# Patient Record
Sex: Female | Born: 1995 | Race: Black or African American | Hispanic: No | Marital: Single | Smoking: Former smoker
Health system: Southern US, Community
[De-identification: ages and names within clinical notes are randomized; demographics above are authoritative.]

## PROBLEM LIST (undated history)

## (undated) ENCOUNTER — Inpatient Hospital Stay (HOSPITAL_COMMUNITY): Payer: Self-pay

## (undated) DIAGNOSIS — N390 Urinary tract infection, site not specified: Secondary | ICD-10-CM

## (undated) DIAGNOSIS — A749 Chlamydial infection, unspecified: Secondary | ICD-10-CM

## (undated) DIAGNOSIS — A549 Gonococcal infection, unspecified: Secondary | ICD-10-CM

## (undated) HISTORY — PX: EYE SURGERY: SHX253

## (undated) HISTORY — PX: HERNIA REPAIR: SHX51

## (undated) HISTORY — DX: Gonococcal infection, unspecified: A54.9

---
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
## 2013-09-10 ENCOUNTER — Emergency Department (HOSPITAL_COMMUNITY)
Admission: EM | Admit: 2013-09-10 | Discharge: 2013-09-10 | Disposition: A | Discharge status: Home / Self Care | Payer: Self-pay | Attending: Emergency Medicine | Admitting: Emergency Medicine

## 2013-09-10 ENCOUNTER — Encounter (HOSPITAL_COMMUNITY): Payer: Self-pay | Admitting: Emergency Medicine

## 2013-09-10 DIAGNOSIS — Z8619 Personal history of other infectious and parasitic diseases: Secondary | ICD-10-CM | POA: Insufficient documentation

## 2013-09-10 DIAGNOSIS — Z3202 Encounter for pregnancy test, result negative: Secondary | ICD-10-CM | POA: Insufficient documentation

## 2013-09-10 DIAGNOSIS — R102 Pelvic and perineal pain: Secondary | ICD-10-CM

## 2013-09-10 DIAGNOSIS — Z87891 Personal history of nicotine dependence: Secondary | ICD-10-CM | POA: Insufficient documentation

## 2013-09-10 DIAGNOSIS — N946 Dysmenorrhea, unspecified: Secondary | ICD-10-CM | POA: Insufficient documentation

## 2013-09-10 DIAGNOSIS — Z87440 Personal history of urinary (tract) infections: Secondary | ICD-10-CM | POA: Insufficient documentation

## 2013-09-10 DIAGNOSIS — R109 Unspecified abdominal pain: Secondary | ICD-10-CM | POA: Insufficient documentation

## 2013-09-10 DIAGNOSIS — N949 Unspecified condition associated with female genital organs and menstrual cycle: Secondary | ICD-10-CM | POA: Insufficient documentation

## 2013-09-10 LAB — URINALYSIS, ROUTINE W REFLEX MICROSCOPIC
BILIRUBIN URINE: NEGATIVE
Glucose, UA: NEGATIVE mg/dL
KETONES UR: NEGATIVE mg/dL
NITRITE: NEGATIVE
Protein, ur: NEGATIVE mg/dL
SPECIFIC GRAVITY, URINE: 1.015 (ref 1.005–1.030)
UROBILINOGEN UA: 0.2 mg/dL (ref 0.0–1.0)
pH: 7 (ref 5.0–8.0)

## 2013-09-10 LAB — URINE MICROSCOPIC-ADD ON

## 2013-09-10 LAB — POC URINE PREG, ED: PREG TEST UR: NEGATIVE

## 2013-09-10 MED ORDER — OXYCODONE-ACETAMINOPHEN 5-325 MG PO TABS
1.0000 | ORAL_TABLET | Freq: Once | ORAL | Status: AC
Start: 2013-09-10 — End: 2013-09-10
  Administered 2013-09-10: 1 via ORAL
  Filled 2013-09-10: qty 1

## 2013-09-10 MED ORDER — TRAMADOL HCL 50 MG PO TABS: 50.0000 mg | ORAL_TABLET | Freq: Four times a day (QID) | ORAL | Status: DC | PRN

## 2013-09-10 MED ORDER — NAPROXEN 500 MG PO TABS: 500.0000 mg | ORAL_TABLET | Freq: Two times a day (BID) | ORAL | Status: DC

## 2013-09-10 NOTE — Discharge Instructions (Signed)
Dysmenorrhea  Menstrual cramps (dysmenorrhea) are caused by the muscles of the uterus tightening (contracting) during a menstrual period. For some women, this discomfort is merely bothersome. For others, dysmenorrhea can be severe enough to interfere with everyday activities for a few days each month.  Primary dysmenorrhea is menstrual cramps that last a couple of days when you start having menstrual periods or soon after. This often begins after a teenager starts having her period. As a woman gets older or has a baby, the cramps will usually lessen or disappear. Secondary dysmenorrhea begins later in life, lasts longer, and the pain may be stronger than primary dysmenorrhea. The pain may start before the period and last a few days after the period.   CAUSES   Dysmenorrhea is usually caused by an underlying problem, such as:  · The tissue lining the uterus grows outside of the uterus in other areas of the body (endometriosis).  · The endometrial tissue, which normally lines the uterus, is found in or grows into the muscular walls of the uterus (adenomyosis).  · The pelvic blood vessels are engorged with blood just before the menstrual period (pelvic congestive syndrome).  · Overgrowth of cells (polyps) in the lining of the uterus or cervix.  · Falling down of the uterus (prolapse) because of loose or stretched ligaments.  · Depression.  · Bladder problems, infection, or inflammation.  · Problems with the intestine, a tumor, or irritable bowel syndrome.  · Cancer of the female organs or bladder.  · A severely tipped uterus.  · A very tight opening or closed cervix.  · Noncancerous tumors of the uterus (fibroids).  · Pelvic inflammatory disease (PID).  · Pelvic scarring (adhesions) from a previous surgery.  · Ovarian cyst.  · An intrauterine device (IUD) used for birth control.  RISK FACTORS  You may be at greater risk of dysmenorrhea if:  · You are younger than age 30.  · You started puberty early.  · You have  irregular or heavy bleeding.  · You have never given birth.  · You have a family history of this problem.  · You are a smoker.  SIGNS AND SYMPTOMS   · Cramping or throbbing pain in your lower abdomen.  · Headaches.  · Lower back pain.  · Nausea or vomiting.  · Diarrhea.  · Sweating or dizziness.  · Loose stools.  DIAGNOSIS   A diagnosis is based on your history, symptoms, physical exam, diagnostic tests, or procedures. Diagnostic tests or procedures may include:  · Blood tests.  · Ultrasonography.  · An examination of the lining of the uterus (dilation and curettage, D&C).  · An examination inside your abdomen or pelvis with a scope (laparoscopy).  · X-rays.  · CT scan.  · MRI.  · An examination inside the bladder with a scope (cystoscopy).  · An examination inside the intestine or stomach with a scope (colonoscopy, gastroscopy).  TREATMENT   Treatment depends on the cause of the dysmenorrhea. Treatment may include:  · Pain medicine prescribed by your health care provider.  · Birth control pills or an IUD with progesterone hormone in it.  · Hormone replacement therapy.  · Nonsteroidal anti-inflammatory drugs (NSAIDs). These may help stop the production of prostaglandins.  · Surgery to remove adhesions, endometriosis, ovarian cyst, or fibroids.  · Removal of the uterus (hysterectomy).  · Progesterone shots to stop the menstrual period.  · Cutting the nerves on the sacrum that go to the female organs (  presacral neurectomy).  · Electric current to the sacral nerves (sacral nerve stimulation).  · Antidepressant medicine.  · Psychiatric therapy, counseling, or group therapy.  · Exercise and physical therapy.  · Meditation and yoga therapy.  · Acupuncture.  HOME CARE INSTRUCTIONS   · Only take over-the-counter or prescription medicines as directed by your health care provider.  · Place a heating pad or hot water bottle on your lower back or abdomen. Do not sleep with the heating pad.  · Use aerobic exercises, walking,  swimming, biking, and other exercises to help lessen the cramping.  · Massage to the lower back or abdomen may help.  · Stop smoking.  · Avoid alcohol and caffeine.  SEEK MEDICAL CARE IF:   · Your pain does not get better with medicine.  · You have pain with sexual intercourse.  · Your pain increases and is not controlled with medicines.  · You have abnormal vaginal bleeding with your period.  · You develop nausea or vomiting with your period that is not controlled with medicine.  SEEK IMMEDIATE MEDICAL CARE IF:   You pass out.   Document Released: 01/14/2005 Document Revised: 09/16/2012 Document Reviewed: 07/02/2012  ExitCare® Patient Information ©2015 [employer]. This information is not intended to replace advice given to you by your health care provider. Make sure you discuss any questions you have with your health care provider.  Pelvic Pain  Female pelvic pain can be caused by many different things and start from a variety of places. Pelvic pain refers to pain that is located in the lower half of the abdomen and between your hips. The pain may occur over a short period of time (acute) or may be reoccurring (chronic). The cause of pelvic pain may be related to disorders affecting the female reproductive organs (gynecologic), but it may also be related to the bladder, kidney stones, an intestinal complication, or muscle or skeletal problems. Getting help right away for pelvic pain is important, especially if there has been severe, sharp, or a sudden onset of unusual pain. It is also important to get help right away because some types of pelvic pain can be life threatening.   CAUSES   Below are only some of the causes of pelvic pain. The causes of pelvic pain can be in one of several categories.   · Gynecologic.  ¨ Pelvic inflammatory disease.  ¨ Sexually transmitted infection.  ¨ Ovarian cyst or a twisted ovarian ligament (ovarian torsion).  ¨ Uterine lining that grows outside the uterus  (endometriosis).  ¨ Fibroids, cysts, or tumors.  ¨ Ovulation.  · Pregnancy.  ¨ Pregnancy that occurs outside the uterus (ectopic pregnancy).  ¨ Miscarriage.  ¨ Labor.  ¨ Abruption of the placenta or ruptured uterus.  · Infection.  ¨ Uterine infection (endometritis).  ¨ Bladder infection.  ¨ Diverticulitis.  ¨ Miscarriage related to a uterine infection (septic abortion).  · Bladder.  ¨ Inflammation of the bladder (cystitis).  ¨ Kidney stone(s).  · Gastrointestinal.  ¨ Constipation.  ¨ Diverticulitis.  · Neurologic.  ¨ Trauma.  ¨ Feeling pelvic pain because of mental or emotional causes (psychosomatic).  · Cancers of the bowel or pelvis.  EVALUATION   Your caregiver will want to take a careful history of your concerns. This includes recent changes in your health, a careful gynecologic history of your periods (menses), and a sexual history. Obtaining your family history and medical history is also important. Your caregiver may suggest a pelvic exam. A   pelvic exam will help identify the location and severity of the pain. It also helps in the evaluation of which organ system may be involved. In order to identify the cause of the pelvic pain and be properly treated, your caregiver may order tests. These tests may include:   · A pregnancy test.  · Pelvic ultrasonography.  · An X-ray exam of the abdomen.  · A urinalysis or evaluation of vaginal discharge.  · Blood tests.  HOME CARE INSTRUCTIONS   · Only take over-the-counter or prescription medicines for pain, discomfort, or fever as directed by your caregiver.    · Rest as directed by your caregiver.    · Eat a balanced diet.    · Drink enough fluids to make your urine clear or pale yellow, or as directed.    · Avoid sexual intercourse if it causes pain.    · Apply warm or cold compresses to the lower abdomen depending on which one helps the pain.    · Avoid stressful situations.    · Keep a journal of your pelvic pain. Write down when it started, where the pain is  located, and if there are things that seem to be associated with the pain, such as food or your menstrual cycle.  · Follow up with your caregiver as directed.    SEEK MEDICAL CARE IF:  · Your medicine does not help your pain.  · You have abnormal vaginal discharge.  SEEK IMMEDIATE MEDICAL CARE IF:   · You have heavy bleeding from the vagina.    · Your pelvic pain increases.    · You feel light-headed or faint.    · You have chills.    · You have pain with urination or blood in your urine.    · You have uncontrolled diarrhea or vomiting.    · You have a fever or persistent symptoms for more than 3 days.  · You have a fever and your symptoms suddenly get worse.    · You are being physically or sexually abused.    MAKE SURE YOU:  · Understand these instructions.  · Will watch your condition.  · Will get help if you are not doing well or get worse.  Document Released: 12/12/2003 Document Revised: 05/31/2013 Document Reviewed: 05/06/2011  ExitCare® Patient Information ©2015 [employer]. This information is not intended to replace advice given to you by your health care provider. Make sure you discuss any questions you have with your health care provider.

## 2013-09-10 NOTE — ED Notes (Signed)
Dr. Otter at the bedside.  

## 2013-09-10 NOTE — ED Provider Notes (Signed)
CSN: [id number]     Arrival date & time 09/10/13  0223 History   First MD Initiated Contact with Patient 09/10/13 [phone]     Chief Complaint  Patient presents with  . Abdominal Pain     (Consider location/radiation/quality/duration/timing/severity/associated sxs/prior Treatment) HPI 18 year old female presents to emergency room with complaint of lower abdominal pain starting around 1 AM.  Patient started her period today.  Pain is described as a sharp and crampy in nature.  It starts at her umbilicus and spreads downward.  She denies any nausea vomiting diarrhea.  She denies passing a large amount of bleeding.  Last menstrual period was July 6.  No prior history of dysmenorrhea.  No dyspareunia Past Medical History  Diagnosis Date  . Chlamydia   . Urinary tract infection    Past Surgical History  Procedure Laterality Date  . Hernia repair    . Eye surgery     History reviewed. No pertinent family history. History  Substance Use Topics  . Smoking status: Former Games developer  . Smokeless tobacco: Not on file  . Alcohol Use: No     Comment: Pt says she drank prior to pregnancy   OB History   Grav Para Term Preterm Abortions TAB SAB Ect Mult Living   0 0 0 0 0 0 0 0 0 0      Review of Systems  See History of Present Illness; otherwise all other systems are reviewed and negative   Allergies  Review of patient's allergies indicates no known allergies.  Home Medications   Prior to Admission medications   Medication Sig Start Date End Date Taking? Authorizing Provider  ibuprofen (ADVIL,MOTRIN) 200 MG tablet Take 400 mg by mouth every 6 (six) hours as needed for mild pain.   Yes Historical Provider, MD  naproxen (NAPROSYN) 500 MG tablet Take 1 tablet (500 mg total) by mouth 2 (two) times daily. 09/10/13   Olivia Mackie, MD  traMADol (ULTRAM) 50 MG tablet Take 1 tablet (50 mg total) by mouth every 6 (six) hours as needed for moderate pain or severe pain. 09/10/13   Olivia Mackie, MD   BP  105/88  Pulse 59  Temp(Src) 98.1 F (36.7 C) (Oral)  Resp 18  SpO2 100%  LMP 08/02/2013 Physical Exam  Nursing note and vitals reviewed. Constitutional: She is oriented to person, place, and time. She appears well-developed and well-nourished.  HENT:  Head: Normocephalic and atraumatic.  Nose: Nose normal.  Mouth/Throat: Oropharynx is clear and moist.  Eyes: Conjunctivae and EOM are normal. Pupils are equal, round, and reactive to light.  Neck: Normal range of motion. Neck supple. No JVD present. No tracheal deviation present. No thyromegaly present.  Cardiovascular: Normal rate, regular rhythm, normal heart sounds and intact distal pulses.  Exam reveals no gallop and no friction rub.   No murmur heard. Pulmonary/Chest: Effort normal and breath sounds normal. No stridor. No respiratory distress. She has no wheezes. She has no rales. She exhibits no tenderness.  Abdominal: Soft. Bowel sounds are normal. She exhibits no distension and no mass. There is no tenderness. There is no rebound and no guarding.  Genitourinary:  External genitalia normal Vagina with Small amount of bloody discharge Cervix closed no lesions No cervical motion tenderness Adnexa palpated, no masses and only mild tenderness noted Bladder palpated no tenderness Uterus palpated no masses or tenderness    Musculoskeletal: Normal range of motion. She exhibits no edema and no tenderness.  Lymphadenopathy:    She  has no cervical adenopathy.  Neurological: She is alert and oriented to person, place, and time. She exhibits normal muscle tone. Coordination normal.  Skin: Skin is warm and dry. No rash noted. No erythema. No pallor.  Psychiatric: She has a normal mood and affect. Her behavior is normal. Judgment and thought content normal.    ED Course  Procedures (including critical care time) Labs Review Labs Reviewed  URINALYSIS, ROUTINE W REFLEX MICROSCOPIC - Abnormal; Notable for the following:    Hgb urine  dipstick MODERATE (*)    Leukocytes, UA TRACE (*)    All other components within normal limits  URINE MICROSCOPIC-ADD ON  POC URINE PREG, ED    Imaging Review No results found.   EKG Interpretation None      MDM   Final diagnoses:  Pelvic pain in female  Dysmenorrhea    18 year old female with lower abdominal pain, recently started her period.  No cervical motion tenderness, no uterine tenderness.  She has mild bilateral adnexal tenderness.  This resolved with Percocet given at triage.  Plan to discharge home to followup with women's clinic if she has further problems.  Olivia Mackielga M Talley Casco, MD 09/10/13 [phone]

## 2013-09-10 NOTE — ED Notes (Signed)
Presents with lower abdominal pain and lower back pain began at 1 am today-associated with vaginal bleeding. LMP 08/02/13. Pain is described as something pulling my insides.

## 2013-12-30 ENCOUNTER — Inpatient Hospital Stay (HOSPITAL_COMMUNITY)
Admission: AD | Admit: 2013-12-30 | Discharge: 2013-12-30 | Disposition: A | Discharge status: Home / Self Care | Payer: Self-pay | Source: Ambulatory Visit | Attending: Obstetrics & Gynecology | Admitting: Obstetrics & Gynecology

## 2013-12-30 ENCOUNTER — Encounter (HOSPITAL_COMMUNITY): Payer: Self-pay | Admitting: *Deleted

## 2013-12-30 DIAGNOSIS — A5901 Trichomonal vulvovaginitis: Secondary | ICD-10-CM | POA: Insufficient documentation

## 2013-12-30 DIAGNOSIS — N390 Urinary tract infection, site not specified: Secondary | ICD-10-CM

## 2013-12-30 DIAGNOSIS — A599 Trichomoniasis, unspecified: Secondary | ICD-10-CM

## 2013-12-30 DIAGNOSIS — Z87891 Personal history of nicotine dependence: Secondary | ICD-10-CM | POA: Insufficient documentation

## 2013-12-30 LAB — WET PREP, GENITAL: Yeast Wet Prep HPF POC: NONE SEEN

## 2013-12-30 LAB — URINALYSIS, ROUTINE W REFLEX MICROSCOPIC
Bilirubin Urine: NEGATIVE
GLUCOSE, UA: NEGATIVE mg/dL
HGB URINE DIPSTICK: NEGATIVE
Ketones, ur: NEGATIVE mg/dL
Nitrite: POSITIVE — AB
Protein, ur: NEGATIVE mg/dL
SPECIFIC GRAVITY, URINE: 1.015 (ref 1.005–1.030)
Urobilinogen, UA: 0.2 mg/dL (ref 0.0–1.0)
pH: 7.5 (ref 5.0–8.0)

## 2013-12-30 LAB — URINE MICROSCOPIC-ADD ON

## 2013-12-30 LAB — POCT PREGNANCY, URINE: PREG TEST UR: NEGATIVE

## 2013-12-30 LAB — HIV ANTIBODY (ROUTINE TESTING W REFLEX): HIV: NONREACTIVE

## 2013-12-30 MED ORDER — CEPHALEXIN 500 MG PO CAPS: 500.0000 mg | ORAL_CAPSULE | Freq: Four times a day (QID) | ORAL | Status: DC

## 2013-12-30 MED ORDER — METRONIDAZOLE 500 MG PO TABS: 2000.0000 mg | ORAL_TABLET | Freq: Once | ORAL | Status: DC

## 2013-12-30 NOTE — MAU Provider Note (Signed)
History     CSN: [id number]  Arrival date and time: 12/30/13 0030   First Provider Initiated Contact with Patient 12/30/13 [phone]      Chief Complaint  Patient presents with  . Possible Pregnancy  . Exposure to STD   HPI Comments: Julie Cline 18 y.o. G0P0000 presents to MAU with sx of UTI that have been ongoing for 2 days. She also wants STD screening. She has no partner change.   Possible Pregnancy  Exposure to STD  The patient's primary symptoms include dysuria.      Past Medical History  Diagnosis Date  . Chlamydia   . Urinary tract infection     Past Surgical History  Procedure Laterality Date  . Hernia repair    . Eye surgery      No family history on file.  History  Substance Use Topics  . Smoking status: Former Games developermoker  . Smokeless tobacco: Not on file  . Alcohol Use: No     Comment: Pt says she drank prior to pregnancy    Allergies: No Known Allergies  Prescriptions prior to admission  Medication Sig Dispense Refill Last Dose  . ibuprofen (ADVIL,MOTRIN) 200 MG tablet Take 400 mg by mouth every 6 (six) hours as needed for mild pain.   09/09/2013 at Unknown time  . naproxen (NAPROSYN) 500 MG tablet Take 1 tablet (500 mg total) by mouth 2 (two) times daily. 30 tablet 0   . traMADol (ULTRAM) 50 MG tablet Take 1 tablet (50 mg total) by mouth every 6 (six) hours as needed for moderate pain or severe pain. 15 tablet 0     Review of Systems  Constitutional: Negative.   HENT: Negative.   Eyes: Negative.   Respiratory: Negative.   Cardiovascular: Negative.   Genitourinary: Positive for dysuria.  Musculoskeletal: Negative.   Skin: Negative.   Neurological: Negative.   Psychiatric/Behavioral: Negative.    Physical Exam   Blood pressure 123/80, pulse 59, temperature 98.5 F (36.9 C), temperature source Oral, resp. rate 16, height 5\' 5"  (1.651 m), weight 66.407 kg (146 lb 6.4 oz), last menstrual period 11/29/2013, SpO2 100 %, unknown if currently  breastfeeding.  Physical Exam  Constitutional: She is oriented to person, place, and time. She appears well-developed and well-nourished. No distress.  HENT:  Head: Normocephalic and atraumatic.  Eyes: Pupils are equal, round, and reactive to light.  GI: Soft. Bowel sounds are normal. She exhibits no distension. There is no tenderness. There is no rebound and no guarding.  Genitourinary:  Genital:External negative Vaginal:small amount yellow discharge Cervix:closed thick Bimanual: nontender   Musculoskeletal: Normal range of motion.  Neurological: She is alert and oriented to person, place, and time.  Skin: Skin is warm and dry.  Psychiatric: She has a normal mood and affect. Her behavior is normal. Judgment and thought content normal.   Results for orders placed or performed during the hospital encounter of 12/30/13 (from the past 24 hour(s))  Urinalysis, Routine w reflex microscopic     Status: Abnormal   Collection Time: 12/30/13  1:07 AM  Result Value Ref Range   Color, Urine YELLOW YELLOW   APPearance CLEAR CLEAR   Specific Gravity, Urine 1.015 1.005 - 1.030   pH 7.5 5.0 - 8.0   Glucose, UA NEGATIVE NEGATIVE mg/dL   Hgb urine dipstick NEGATIVE NEGATIVE   Bilirubin Urine NEGATIVE NEGATIVE   Ketones, ur NEGATIVE NEGATIVE mg/dL   Protein, ur NEGATIVE NEGATIVE mg/dL   Urobilinogen, UA 0.2 0.0 -  1.0 mg/dL   Nitrite POSITIVE (A) NEGATIVE   Leukocytes, UA SMALL (A) NEGATIVE  Urine microscopic-add on     Status: Abnormal   Collection Time: 12/30/13  1:07 AM  Result Value Ref Range   Squamous Epithelial / LPF RARE RARE   WBC, UA 7-10 <3 WBC/hpf   RBC / HPF 0-2 <3 RBC/hpf   Bacteria, UA MANY (A) RARE  Pregnancy, urine POC     Status: None   Collection Time: 12/30/13  1:33 AM  Result Value Ref Range   Preg Test, Ur NEGATIVE NEGATIVE  Wet prep, genital     Status: Abnormal   Collection Time: 12/30/13  4:00 AM  Result Value Ref Range   Yeast Wet Prep HPF POC NONE SEEN NONE  SEEN   Trich, Wet Prep FEW (A) NONE SEEN   Clue Cells Wet Prep HPF POC FEW (A) NONE SEEN   WBC, Wet Prep HPF POC MANY (A) NONE SEEN   . MAU Course  Procedures  MDM Wet prep/ gc/chlamydia Urine culture  Assessment and Plan   A: UTI Trichomonas  P: Keflex 500 mg po QID x 7 days Flagyl 2 grams po Partner needs treatment Increase fluids Establish GYN care   Carolynn ServeBarefoot, Danialle Dement Miller 12/30/2013, 4:43 AM

## 2013-12-30 NOTE — MAU Note (Signed)
Wants to be tested for STDs, no known exposure. LMP 11/29/13. No pregnancy test at home, no birth control. Vaginal discharge, white, normal for her just increased. Denies vaginal irritation. Denies vaginal bleeding. "a little bit" of abdominal pain.

## 2013-12-30 NOTE — Discharge Instructions (Signed)
Urinary Tract Infection °Urinary tract infections (UTIs) can develop anywhere along your urinary tract. Your urinary tract is your body's drainage system for removing wastes and extra water. Your urinary tract includes two kidneys, two ureters, a bladder, and a urethra. Your kidneys are a pair of bean-shaped organs. Each kidney is about the size of your fist. They are located below your ribs, one on each side of your spine. °CAUSES °Infections are caused by microbes, which are microscopic organisms, including fungi, viruses, and bacteria. These organisms are so small that they can only be seen through a microscope. Bacteria are the microbes that most commonly cause UTIs. °SYMPTOMS  °Symptoms of UTIs may vary by age and gender of the patient and by the location of the infection. Symptoms in young women typically include a frequent and intense urge to urinate and a painful, burning feeling in the bladder or urethra during urination. Older women and men are more likely to be tired, shaky, and weak and have muscle aches and abdominal pain. A fever may mean the infection is in your kidneys. Other symptoms of a kidney infection include pain in your back or sides below the ribs, nausea, and vomiting. °DIAGNOSIS °To diagnose a UTI, your caregiver will ask you about your symptoms. Your caregiver also will ask to provide a urine sample. The urine sample will be tested for bacteria and white blood cells. White blood cells are made by your body to help fight infection. °TREATMENT  °Typically, UTIs can be treated with medication. Because most UTIs are caused by a bacterial infection, they usually can be treated with the use of antibiotics. The choice of antibiotic and length of treatment depend on your symptoms and the type of bacteria causing your infection. °HOME CARE INSTRUCTIONS °· If you were prescribed antibiotics, take them exactly as your caregiver instructs you. Finish the medication even if you feel better after you  have only taken some of the medication. °· Drink enough water and fluids to keep your urine clear or pale yellow. °· Avoid caffeine, tea, and carbonated beverages. They tend to irritate your bladder. °· Empty your bladder often. Avoid holding urine for long periods of time. °· Empty your bladder before and after sexual intercourse. °· After a bowel movement, women should cleanse from front to back. Use each tissue only once. °SEEK MEDICAL CARE IF:  °· You have back pain. °· You develop a fever. °· Your symptoms do not begin to resolve within 3 days. °SEEK IMMEDIATE MEDICAL CARE IF:  °· You have severe back pain or lower abdominal pain. °· You develop chills. °· You have nausea or vomiting. °· You have continued burning or discomfort with urination. °MAKE SURE YOU:  °· Understand these instructions. °· Will watch your condition. °· Will get help right away if you are not doing well or get worse. °Document Released: 10/24/2004 Document Revised: 07/16/2011 Document Reviewed: 02/22/2011 °ExitCare® Patient Information ©2015 [employer]. This information is not intended to replace advice given to you by your health care provider. Make sure you discuss any questions you have with your health care provider. °Trichomoniasis °Trichomoniasis is an infection caused by an organism called Trichomonas. The infection can affect both women and men. In women, the outer female genitalia and the vagina are affected. In men, the penis is mainly affected, but the prostate and other reproductive organs can also be involved. Trichomoniasis is a sexually transmitted infection (STI) and is most often passed to another person through sexual contact.  °  RISK FACTORS °· Having unprotected sexual intercourse. °· Having sexual intercourse with an infected partner. °SIGNS AND SYMPTOMS  °Symptoms of trichomoniasis in women include: °· Abnormal gray-green frothy vaginal discharge. °· Itching and irritation of the vagina. °· Itching and irritation  of the area outside the vagina. °Symptoms of trichomoniasis in men include:  °· Penile discharge with or without pain. °· Pain during urination. This results from inflammation of the urethra. °DIAGNOSIS  °Trichomoniasis may be found during a Pap test or physical exam. Your health care provider may use one of the following methods to help diagnose this infection: °· Examining vaginal discharge under a microscope. For men, urethral discharge would be examined. °· Testing the pH of the vagina with a test tape. °· Using a vaginal swab test that checks for the Trichomonas organism. A test is available that provides results within a few minutes. °· Doing a culture test for the organism. This is not usually needed. °TREATMENT  °· You may be given medicine to fight the infection. Women should inform their health care provider if they could be or are pregnant. Some medicines used to treat the infection should not be taken during pregnancy. °· Your health care provider may recommend over-the-counter medicines or creams to decrease itching or irritation. °· Your sexual partner will need to be treated if infected. °HOME CARE INSTRUCTIONS  °· Take medicines only as directed by your health care provider. °· Take over-the-counter medicine for itching or irritation as directed by your health care provider. °· Do not have sexual intercourse while you have the infection. °· Women should not douche or wear tampons while they have the infection. °· Discuss your infection with your partner. Your partner may have gotten the infection from you, or you may have gotten it from your partner. °· Have your sex partner get examined and treated if necessary. °· Practice safe, informed, and protected sex. °· See your health care provider for other STI testing. °SEEK MEDICAL CARE IF:  °· You still have symptoms after you finish your medicine. °· You develop abdominal pain. °· You have pain when you urinate. °· You have bleeding after sexual  intercourse. °· You develop a rash. °· Your medicine makes you sick or makes you throw up (vomit). °MAKE SURE YOU: °· Understand these instructions. °· Will watch your condition. °· Will get help right away if you are not doing well or get worse. °Document Released: 07/10/2000 Document Revised: 05/31/2013 Document Reviewed: 10/26/2012 °ExitCare® Patient Information ©2015 [employer]. This information is not intended to replace advice given to you by your health care provider. Make sure you discuss any questions you have with your health care provider. ° °

## 2013-12-31 LAB — GC/CHLAMYDIA PROBE AMP
CT Probe RNA: NEGATIVE
GC Probe RNA: NEGATIVE

## 2014-01-01 LAB — URINE CULTURE: Special Requests: NORMAL

## 2014-04-02 ENCOUNTER — Inpatient Hospital Stay (HOSPITAL_COMMUNITY)
Admission: AD | Admit: 2014-04-02 | Discharge: 2014-04-02 | Disposition: A | Discharge status: Home / Self Care | Payer: Self-pay | Source: Ambulatory Visit | Attending: Family Medicine | Admitting: Family Medicine

## 2014-04-02 DIAGNOSIS — Z87891 Personal history of nicotine dependence: Secondary | ICD-10-CM | POA: Insufficient documentation

## 2014-04-02 DIAGNOSIS — Z3202 Encounter for pregnancy test, result negative: Secondary | ICD-10-CM | POA: Insufficient documentation

## 2014-04-02 LAB — HCG, QUANTITATIVE, PREGNANCY: hCG, Beta Chain, Quant, S: <1 m[IU]/mL (ref <5)

## 2014-04-02 LAB — POCT PREGNANCY, URINE: Preg Test, Ur: NEGATIVE

## 2014-04-02 NOTE — MAU Note (Signed)
Pt states 2 +upt's at home. Here for verification. No pain or bleeding.

## 2014-04-02 NOTE — Discharge Instructions (Signed)
You pregnancy test was negative today. Take another pregnancy test at home if you do not have a period in another 2 weeks. Be sure to follow the instructions on the package. You can follow up with a regular provider for evaluation of missed periods or for help with birth control if desired.

## 2014-04-02 NOTE — MAU Provider Note (Signed)
History     CSN: [id number]  Arrival date and time: 04/02/14 1201   None     Chief Complaint  Patient presents with  . Pregnancy Verification    HPI 19 y.o. G0P0000 here requesting pregnancy verification, + UPT at home x 2. Patient's last menstrual period was 02/05/2014 (exact date).   Past Medical History  Diagnosis Date  . Chlamydia   . Urinary tract infection     Past Surgical History  Procedure Laterality Date  . Hernia repair    . Eye surgery      No family history on file.  History  Substance Use Topics  . Smoking status: Former Games developermoker  . Smokeless tobacco: Not on file  . Alcohol Use: No     Comment: Pt says she drank prior to pregnancy    Allergies: No Known Allergies  Prescriptions prior to admission  Medication Sig Dispense Refill Last Dose  . cephALEXin (KEFLEX) 500 MG capsule Take 1 capsule (500 mg total) by mouth 4 (four) times daily. 28 capsule 0   . metroNIDAZOLE (FLAGYL) 500 MG tablet Take 4 tablets (2,000 mg total) by mouth once. 4 tablet 0   . naproxen (NAPROSYN) 500 MG tablet Take 1 tablet (500 mg total) by mouth 2 (two) times daily. 30 tablet 0     Review of Systems  Constitutional: Negative.   Respiratory: Negative.   Cardiovascular: Negative.   Gastrointestinal: Negative for nausea, vomiting, abdominal pain, diarrhea and constipation.  Genitourinary: Negative for dysuria, urgency, frequency, hematuria and flank pain.       Negative for vaginal bleeding, vaginal discharge  Musculoskeletal: Negative.   Neurological: Negative.   Psychiatric/Behavioral: Negative.    Physical Exam   Blood pressure 133/78, pulse 63, temperature 98.3 F (36.8 C), temperature source Oral, resp. rate 18, height 5\' 4"  (1.626 m), weight 130 lb 2 oz (59.024 kg), last menstrual period 02/05/2014, not currently breastfeeding.  Physical Exam  Nursing note and vitals reviewed.   MAU Course  Procedures  Results for orders placed or performed during the  hospital encounter of 04/02/14 (from the past 24 hour(s))  Pregnancy, urine POC     Status: None   Collection Time: 04/02/14 12:34 PM  Result Value Ref Range   Preg Test, Ur NEGATIVE NEGATIVE  hCG, quantitative, pregnancy     Status: None   Collection Time: 04/02/14 12:40 PM  Result Value Ref Range   hCG, Beta Chain, Quant, S <1 <5 mIU/mL   Pt brought in positive pregnancy tests from home with packaging - upon examining tests, these were actually ovulation predictors, not pregnancy tests. Discussed at length. Advised to avoid unprotected intercourse if pregnancy is not desired.   Assessment and Plan   1. Negative pregnancy test   Urine and serum pregnancy tests negative. Advised to f/u PRN outpatient. Repeat UPT at home in 2 weeks if no menses.      Medication List    TAKE these medications        cephALEXin 500 MG capsule  Commonly known as:  KEFLEX  Take 1 capsule (500 mg total) by mouth 4 (four) times daily.     metroNIDAZOLE 500 MG tablet  Commonly known as:  FLAGYL  Take 4 tablets (2,000 mg total) by mouth once.     naproxen 500 MG tablet  Commonly known as:  NAPROSYN  Take 1 tablet (500 mg total) by mouth 2 (two) times daily.        Follow-up Information  [facility] with [facility].   Why:  As needed for birth control, women's health        FRAZIER,NATALIE 04/02/2014, 1:52 PM

## 2015-01-29 NOTE — L&D Delivery Note (Signed)
Delivery Note  Patient presented 10/4 for post dates induction. Induced with foley and cytotec. SROM occurred approximately 5 hours prior to delivery. Intermittent variables throughout labor course, but with moderate variability and occasional accels.   At 4:24 PM a viable female was delivered via Vaginal, Spontaneous Delivery (Presentation: OA ).  APGAR: 8, 9; weight  pending Placenta status: intact .  Cord: 3v  with the following complications: none.  Cord pH: not obtained  Anesthesia:  epidural Episiotomy: None Lacerations: Labial;1st degree Suture Repair: 4-0 vicryl for left labial and 3-0 vicryl for perineal Est. Blood Loss (mL):  200  Mom to postpartum.  Baby to Couplet care / Skin to Skin.  Cherrie Gauzeoah B Kaian Fahs 11/01/2015, 4:51 PM

## 2015-03-14 ENCOUNTER — Encounter: Payer: Self-pay | Admitting: Obstetrics & Gynecology

## 2015-03-14 ENCOUNTER — Inpatient Hospital Stay (HOSPITAL_COMMUNITY)
Admission: AD | Admit: 2015-03-14 | Discharge: 2015-03-14 | Disposition: A | Discharge status: Home / Self Care | Payer: Medicaid Other | Source: Ambulatory Visit | Attending: Obstetrics & Gynecology | Admitting: Obstetrics & Gynecology

## 2015-03-14 ENCOUNTER — Ambulatory Visit (INDEPENDENT_AMBULATORY_CARE_PROVIDER_SITE_OTHER): Payer: Self-pay

## 2015-03-14 DIAGNOSIS — Z3201 Encounter for pregnancy test, result positive: Secondary | ICD-10-CM

## 2015-03-14 DIAGNOSIS — N912 Amenorrhea, unspecified: Secondary | ICD-10-CM | POA: Insufficient documentation

## 2015-03-14 LAB — POCT PREGNANCY, URINE: PREG TEST UR: POSITIVE — AB

## 2015-03-14 NOTE — MAU Provider Note (Signed)
Julie Cline presented to MAU for a faintly positive pregnancy test at home. LMP 12/21 and skipped Jan period. Has regular periods. Reports mild breast tenderness. No nausea/vomiting. Denies abdominal pain. Denies spotting.   O: BP 132/69 mmHg  Pulse 78  Temp(Src) 98 F (36.7 C) (Oral)  Resp 16  Ht  (1.651 m)  Wt 161 lb 3.2 oz (73.12 kg)  BMI 26.83 kg/m2  LMP 01/18/2015  Gen: well appearing.  CV: RR Lungs: normal WOB Abd: soft, NT/ND  AP Possible early pregnancy with out warning/signs sx. MSE performed and patient sent to [facility] for confirmation of pregnancy  Federico Flake, MD

## 2015-03-14 NOTE — Progress Notes (Signed)
Pt here today for pregnancy test.  Resulted positive.  Pt stated that her LMP was 01/18/15.  She is 7w 6d today with EDD 10/25/15.  Advised pt to start prenatal care at the [facility].  Pt agreed.  Pt given proof of pregnancy and also contact info for help with initiating pregnancy Medicaid.

## 2015-03-14 NOTE — MAU Note (Signed)
Missed her period.   +HPT last night. "line was faint"

## 2015-04-22 ENCOUNTER — Inpatient Hospital Stay (HOSPITAL_COMMUNITY)
Admission: AD | Admit: 2015-04-22 | Discharge: 2015-04-22 | Disposition: A | Discharge status: Home / Self Care | Payer: Medicaid Other | Source: Ambulatory Visit | Attending: Obstetrics & Gynecology | Admitting: Obstetrics & Gynecology

## 2015-04-22 ENCOUNTER — Encounter (HOSPITAL_COMMUNITY): Payer: Self-pay | Admitting: Certified Nurse Midwife

## 2015-04-22 DIAGNOSIS — O26851 Spotting complicating pregnancy, first trimester: Secondary | ICD-10-CM | POA: Insufficient documentation

## 2015-04-22 DIAGNOSIS — Z87891 Personal history of nicotine dependence: Secondary | ICD-10-CM | POA: Diagnosis not present

## 2015-04-22 DIAGNOSIS — Z3A13 13 weeks gestation of pregnancy: Secondary | ICD-10-CM | POA: Diagnosis not present

## 2015-04-22 DIAGNOSIS — O98311 Other infections with a predominantly sexual mode of transmission complicating pregnancy, first trimester: Secondary | ICD-10-CM

## 2015-04-22 DIAGNOSIS — O4691 Antepartum hemorrhage, unspecified, first trimester: Secondary | ICD-10-CM

## 2015-04-22 DIAGNOSIS — O209 Hemorrhage in early pregnancy, unspecified: Secondary | ICD-10-CM

## 2015-04-22 DIAGNOSIS — A599 Trichomoniasis, unspecified: Secondary | ICD-10-CM

## 2015-04-22 DIAGNOSIS — A5901 Trichomonal vulvovaginitis: Secondary | ICD-10-CM | POA: Insufficient documentation

## 2015-04-22 LAB — URINALYSIS, ROUTINE W REFLEX MICROSCOPIC
Bilirubin Urine: NEGATIVE
Glucose, UA: NEGATIVE mg/dL
Hgb urine dipstick: NEGATIVE
Ketones, ur: NEGATIVE mg/dL
Leukocytes, UA: NEGATIVE
NITRITE: NEGATIVE
Protein, ur: NEGATIVE mg/dL
SPECIFIC GRAVITY, URINE: 1.015 (ref 1.005–1.030)
pH: 7 (ref 5.0–8.0)

## 2015-04-22 LAB — WET PREP, GENITAL
Clue Cells Wet Prep HPF POC: NONE SEEN
SPERM: NONE SEEN
YEAST WET PREP: NONE SEEN

## 2015-04-22 LAB — ABO/RH: ABO/RH(D): A POS

## 2015-04-22 MED ORDER — METRONIDAZOLE 500 MG PO TABS
2000.0000 mg | ORAL_TABLET | Freq: Once | ORAL | Status: AC
  Administered 2015-04-22: 2000 mg via ORAL
  Filled 2015-04-22: qty 4

## 2015-04-22 NOTE — Discharge Instructions (Signed)
Trichomoniasis Trichomoniasis is an infection caused by an organism called Trichomonas. The infection can affect both women and men. In women, the outer female genitalia and the vagina are affected. In men, the penis is mainly affected, but the prostate and other reproductive organs can also be involved. Trichomoniasis is a sexually transmitted infection (STI) and is most often passed to another person through sexual contact.  RISK FACTORS  Having unprotected sexual intercourse.  Having sexual intercourse with an infected partner. SIGNS AND SYMPTOMS  Symptoms of trichomoniasis in women include:  Abnormal gray-green frothy vaginal discharge.  Itching and irritation of the vagina.  Itching and irritation of the area outside the vagina. Symptoms of trichomoniasis in men include:   Penile discharge with or without pain.  Pain during urination. This results from inflammation of the urethra. DIAGNOSIS  Trichomoniasis may be found during a Pap test or physical exam. Your health care provider may use one of the following methods to help diagnose this infection:  Testing the pH of the vagina with a test tape.  Using a vaginal swab test that checks for the Trichomonas organism. A test is available that provides results within a few minutes.  Examining a urine sample.  Testing vaginal secretions. Your health care provider may test you for other STIs, including HIV. TREATMENT   You may be given medicine to fight the infection. Women should inform their health care provider if they could be or are pregnant. Some medicines used to treat the infection should not be taken during pregnancy.  Your health care provider may recommend over-the-counter medicines or creams to decrease itching or irritation.  Your sexual partner will need to be treated if infected.  Your health care provider may test you for infection again 3 months after treatment. HOME CARE INSTRUCTIONS   Take medicines only as  directed by your health care provider.  Take over-the-counter medicine for itching or irritation as directed by your health care provider.  Do not have sexual intercourse while you have the infection.  Women should not douche or wear tampons while they have the infection.  Discuss your infection with your partner. Your partner may have gotten the infection from you, or you may have gotten it from your partner.  Have your sex partner get examined and treated if necessary.  Practice safe, informed, and protected sex.  See your health care provider for other STI testing. SEEK MEDICAL CARE IF:   You still have symptoms after you finish your medicine.  You develop abdominal pain.  You have pain when you urinate.  You have bleeding after sexual intercourse.  You develop a rash.  Your medicine makes you sick or makes you throw up (vomit). MAKE SURE YOU:  Understand these instructions.  Will watch your condition.  Will get help right away if you are not doing well or get worse.   This information is not intended to replace advice given to you by your health care provider. Make sure you discuss any questions you have with your health care provider.   Document Released: 07/10/2000 Document Revised: 02/04/2014 Document Reviewed: 10/26/2012 Elsevier Interactive Patient Education 2016 [employer].  Pelvic Rest Pelvic rest is sometimes recommended for women when:   The placenta is partially or completely covering the opening of the cervix (placenta previa).  There is bleeding between the uterine wall and the amniotic sac in the first trimester (subchorionic hemorrhage).  The cervix begins to open without labor starting (incompetent cervix, cervical insufficiency).  The  labor is too early (preterm labor). HOME CARE INSTRUCTIONS  Do not have sexual intercourse, stimulation, or an orgasm.  Do not use tampons, douche, or put anything in the vagina.  Do not lift anything over  10 pounds (4.5 kg).  Avoid strenuous activity or straining your pelvic muscles. SEEK MEDICAL CARE IF:  You have any vaginal bleeding during pregnancy. Treat this as a potential emergency.  You have cramping pain felt low in the stomach (stronger than menstrual cramps).  You notice vaginal discharge (watery, mucus, or bloody).  You have a low, dull backache.  There are regular contractions or uterine tightening. SEEK IMMEDIATE MEDICAL CARE IF: You have vaginal bleeding and have placenta previa.    This information is not intended to replace advice given to you by your health care provider. Make sure you discuss any questions you have with your health care provider.   Document Released: 05/11/2010 Document Revised: 04/08/2011 Document Reviewed: 07/18/2014 Elsevier Interactive Patient Education [employer] Inc[employer]

## 2015-04-22 NOTE — MAU Provider Note (Signed)
History     CSN: [id number]  Arrival date and time: 04/22/15 1309   First Provider Initiated Contact with Patient 04/22/15 1340      Chief Complaint  Patient presents with  . Vaginal Bleeding   HPI Ms. Julie Cline is a 20 y.o. G1P0000 at [redacted]w[redacted]d who presents to MAU today with complaint of spotting today. The patient states with wiping only, she denies need for a pad. She denies heavy bleeding. She states only occasional mild to moderate intermittent lower abdominal pain. She denies pain now. She denies abnormal discharge, recent intercourse, UTI symptoms, N/V/D or constipation. She is not taking anything for pain.   OB History    Gravida Para Term Preterm AB TAB SAB Ectopic Multiple Living        Past Medical History  Diagnosis Date  . Chlamydia   . Urinary tract infection     Past Surgical History  Procedure Laterality Date  . Hernia repair    . Eye surgery      History reviewed. No pertinent family history.  Social History  Substance Use Topics  . Smoking status: Former Games developer  . Smokeless tobacco: None  . Alcohol Use: No     Comment: Pt says she drank prior to pregnancy    Allergies: No Known Allergies  No prescriptions prior to admission    Review of Systems  Constitutional: Negative for fever and malaise/fatigue.  Gastrointestinal: Positive for abdominal pain. Negative for nausea, vomiting, diarrhea and constipation.  Genitourinary: Negative for dysuria, urgency and frequency.       + spotting Neg - vaginal discharge   Physical Exam   Blood pressure 126/58, pulse 76, temperature 99.4 F (37.4 C), temperature source Oral, resp. rate 20, height  (1.626 m), weight 167 lb 3.2 oz (75.841 kg), last menstrual period 01/18/2015.  Physical Exam  Nursing note and vitals reviewed. Constitutional: She is oriented to person, place, and time. She appears well-developed and well-nourished. No distress.  HENT:  Head: Normocephalic  and atraumatic.  Cardiovascular: Normal rate.   Respiratory: Effort normal.  GI: Soft. She exhibits no distension and no mass. There is no tenderness. There is no rebound and no guarding.  Genitourinary: Uterus is enlarged (appropriate for GA). Uterus is not tender. Cervix exhibits friability (mild). Cervix exhibits no motion tenderness and no discharge. Right adnexum displays no mass and no tenderness. Left adnexum displays no mass and no tenderness. No bleeding in the vagina. Vaginal discharge (small amount of thin, yellow discharge noted) found.  Neurological: She is alert and oriented to person, place, and time.  Skin: Skin is warm and dry. No erythema.  Psychiatric: She has a normal mood and affect.  Dilation: Closed Effacement (%): Thick Cervical Position: Posterior Exam by:: Magnus Sinning, PA-C   Results for orders placed or performed during the hospital encounter of 04/22/15 (from the past 24 hour(s))  Urinalysis, Routine w reflex microscopic (not at [facility])     Status: None   Collection Time: 04/22/15  1:22 PM  Result Value Ref Range   Color, Urine YELLOW YELLOW   APPearance CLEAR CLEAR   Specific Gravity, Urine 1.015 1.005 - 1.030   pH 7.0 5.0 - 8.0   Glucose, UA NEGATIVE NEGATIVE mg/dL   Hgb urine dipstick NEGATIVE NEGATIVE   Bilirubin Urine NEGATIVE NEGATIVE   Ketones, ur NEGATIVE NEGATIVE mg/dL   Protein, ur NEGATIVE NEGATIVE mg/dL   Nitrite NEGATIVE NEGATIVE  Leukocytes, UA NEGATIVE NEGATIVE  Wet prep, genital     Status: Abnormal   Collection Time: 04/22/15  1:48 PM  Result Value Ref Range   Yeast Wet Prep HPF POC NONE SEEN NONE SEEN   Trich, Wet Prep PRESENT (A) NONE SEEN   Clue Cells Wet Prep HPF POC NONE SEEN NONE SEEN   WBC, Wet Prep HPF POC MANY (A) NONE SEEN   Sperm NONE SEEN   ABO/Rh     Status: None (Preliminary result)   Collection Time: 04/22/15  1:59 PM  Result Value Ref Range   ABO/RH(D) A POS     MAU Course  Procedures None  MDM FHR - 153 bpm with  doppler UA, Wet prep, GC/Chlamydia today ABO/Rh drawn today. A+ blood type. Assessment and Plan  A: SIUP at [id number] Spotting in pregnancy Trichomonas  P: Discharge home Treated in MAU with Flagyl Partner treatment advised and abstinence x 2 weeks after partner treatment discussed Early pregnancy precautions discussed Patient advised to follow-up with [facility] as planned to start prenatal care Patient may return to MAU as needed or if her condition were to change or worsen   Marny LowensteinJulie N Braeton Wolgamott, PA-C  04/22/2015, 3:00 PM

## 2015-04-22 NOTE — MAU Note (Signed)
Pt complains of vaginal spotting that started today. Pt states she has intermittent pain that she rates 5/10. No other complaints at this time.

## 2015-04-24 LAB — GC/CHLAMYDIA PROBE AMP (~~LOC~~) NOT AT ARMC
Chlamydia: POSITIVE — AB
NEISSERIA GONORRHEA: NEGATIVE

## 2015-04-26 ENCOUNTER — Encounter (HOSPITAL_COMMUNITY): Payer: Self-pay | Admitting: *Deleted

## 2015-05-05 ENCOUNTER — Telehealth (HOSPITAL_COMMUNITY): Payer: Self-pay | Admitting: *Deleted

## 2015-05-05 DIAGNOSIS — A749 Chlamydial infection, unspecified: Secondary | ICD-10-CM

## 2015-05-05 MED ORDER — AZITHROMYCIN 500 MG PO TABS
ORAL_TABLET | ORAL | Status: DC
Start: 2015-05-05 — End: 2015-07-06

## 2015-05-05 NOTE — Telephone Encounter (Signed)
Telephone call to patient notified her of positive chlamydia culture. Rx routed to pharmacy for treatment.  Instructed patient to notify her partner for treatment and to abstain from sex for seven days post treatment.  Report faxed to health department.

## 2015-05-15 ENCOUNTER — Inpatient Hospital Stay (HOSPITAL_COMMUNITY)
Admission: AD | Admit: 2015-05-15 | Discharge: 2015-05-15 | Disposition: A | Discharge status: Home / Self Care | Payer: Medicaid Other | Source: Ambulatory Visit | Attending: Family Medicine | Admitting: Family Medicine

## 2015-05-15 ENCOUNTER — Encounter (HOSPITAL_COMMUNITY): Payer: Self-pay

## 2015-05-15 DIAGNOSIS — Z87891 Personal history of nicotine dependence: Secondary | ICD-10-CM | POA: Diagnosis not present

## 2015-05-15 DIAGNOSIS — S39012A Strain of muscle, fascia and tendon of lower back, initial encounter: Secondary | ICD-10-CM | POA: Insufficient documentation

## 2015-05-15 DIAGNOSIS — X500XXA Overexertion from strenuous movement or load, initial encounter: Secondary | ICD-10-CM | POA: Insufficient documentation

## 2015-05-15 DIAGNOSIS — O26892 Other specified pregnancy related conditions, second trimester: Secondary | ICD-10-CM | POA: Diagnosis present

## 2015-05-15 DIAGNOSIS — R109 Unspecified abdominal pain: Secondary | ICD-10-CM | POA: Diagnosis not present

## 2015-05-15 DIAGNOSIS — Z3A16 16 weeks gestation of pregnancy: Secondary | ICD-10-CM | POA: Insufficient documentation

## 2015-05-15 DIAGNOSIS — O9989 Other specified diseases and conditions complicating pregnancy, childbirth and the puerperium: Secondary | ICD-10-CM

## 2015-05-15 DIAGNOSIS — M549 Dorsalgia, unspecified: Secondary | ICD-10-CM

## 2015-05-15 LAB — URINALYSIS, ROUTINE W REFLEX MICROSCOPIC
Bilirubin Urine: NEGATIVE
GLUCOSE, UA: NEGATIVE mg/dL
Hgb urine dipstick: NEGATIVE
Ketones, ur: NEGATIVE mg/dL
Nitrite: NEGATIVE
PROTEIN: NEGATIVE mg/dL
SPECIFIC GRAVITY, URINE: 1.02 (ref 1.005–1.030)
pH: 6 (ref 5.0–8.0)

## 2015-05-15 LAB — URINE MICROSCOPIC-ADD ON: RBC / HPF: NONE SEEN RBC/hpf (ref 0–5)

## 2015-05-15 MED ORDER — CYCLOBENZAPRINE HCL 10 MG PO TABS: 10.0000 mg | ORAL_TABLET | Freq: Three times a day (TID) | ORAL | Status: DC | PRN

## 2015-05-15 NOTE — MAU Provider Note (Signed)
History   G1 @ 16.5 wks in with c/o was lifting something heavy for her grand mother and felt something pull in her back and shortly after that started having back and abd pain.  CSN: [id number]  Arrival date & time 05/15/15  1541   First Provider Initiated Contact with Patient 05/15/15 1621      Chief Complaint  Patient presents with  . Abdominal Pain  . Back Pain    HPI  Past Medical History  Diagnosis Date  . Chlamydia   . Urinary tract infection     Past Surgical History  Procedure Laterality Date  . Hernia repair    . Eye surgery      Eye lid    History reviewed. No pertinent family history.  Social History  Substance Use Topics  . Smoking status: Former Smoker -- 0.25 packs/day for 6 years    Quit date: 01/28/2015  . Smokeless tobacco: None  . Alcohol Use: No     Comment: Pt says she drank prior to pregnancy    OB History    Gravida Para Term Preterm AB TAB SAB Ectopic Multiple Living   1 0 0 0 0 0 0 0 0 0       Review of Systems  Constitutional: Negative.   HENT: Negative.   Eyes: Negative.   Gastrointestinal: Positive for abdominal pain.  Genitourinary: Negative.   Musculoskeletal: Positive for back pain.  Skin: Negative.   Neurological: Negative.   Hematological: Negative.   Psychiatric/Behavioral: Negative.     Allergies  Review of patient's allergies indicates no known allergies.  Home Medications  No current outpatient prescriptions on file.  BP 109/62 mmHg  Pulse 80  Temp(Src) 98.4 F (36.9 C) (Oral)  Resp 18  Wt 175 lb (79.379 kg)  LMP 01/18/2015  Physical Exam  Constitutional: She is oriented to person, place, and time. She appears well-developed and well-nourished.  HENT:  Head: Normocephalic.  Neck: Normal range of motion.  Cardiovascular: Normal rate, regular rhythm, normal heart sounds and intact distal pulses.   Pulmonary/Chest: Effort normal and breath sounds normal.  Abdominal: Soft. Bowel sounds are normal.   Genitourinary: Vagina normal and uterus normal.  Musculoskeletal: Normal range of motion.  Neurological: She is alert and oriented to person, place, and time. She has normal reflexes.  Skin: Skin is warm and dry.  Psychiatric: She has a normal mood and affect. Her behavior is normal. Judgment and thought content normal.    MAU Course  Procedures (including critical care time)  Labs Reviewed  URINALYSIS, ROUTINE W REFLEX MICROSCOPIC (NOT AT [facility])   No results found.   No diagnosis found.    MDM  SVE firm/cl/th/high.  Dx: muscle strain Plan : flexaril TID PRN back pain and d/c home

## 2015-05-15 NOTE — Discharge Instructions (Signed)
Back Injury Prevention Back injuries can be very painful. They can also be difficult to heal. After having one back injury, you are more likely to injure your back again. It is important to learn how to avoid injuring or re-injuring your back. The following tips can help you to prevent a back injury. WHAT SHOULD I KNOW ABOUT PHYSICAL FITNESS?  Exercise for 30 minutes per day on most days of the week or as directed by your health care provider. Make sure to:  Do aerobic exercises, such as walking, jogging, biking, or swimming.  Do exercises that increase balance and strength, such as tai chi and yoga. These can decrease your risk of falling and injuring your back.  Do stretching exercises to help with flexibility.  Try to develop strong abdominal muscles. Your abdominal muscles provide a lot of the support that is needed by your back.  Maintain a healthy weight. This helps to decrease your risk of a back injury. WHAT SHOULD I KNOW ABOUT MY DIET?  Talk with your health care provider about your overall diet. Take supplements and vitamins only as directed by your health care provider.  Talk with your health care provider about how much calcium and vitamin D you need each day. These nutrients help to prevent weakening of the bones (osteoporosis). Osteoporosis can cause broken (fractured) bones, which lead to back pain.  Include good sources of calcium in your diet, such as dairy products, green leafy vegetables, and products that have had calcium added to them (fortified).  Include good sources of vitamin D in your diet, such as milk and foods that are fortified with vitamin D. WHAT SHOULD I KNOW ABOUT MY POSTURE?  Sit up straight and stand up straight. Avoid leaning forward when you sit or hunching over when you stand.  Choose chairs that have good low-back (lumbar) support.  If you work at a desk, sit close to it so you do not need to lean over. Keep your chin tucked in. Keep your neck  drawn back, and keep your elbows bent at a right angle. Your arms should look like the letter "L."  Sit high and close to the steering wheel when you drive. Add a lumbar support to your car seat, if needed.  Avoid sitting or standing in one position for very long. Take breaks to get up, stretch, and walk around at least one time every hour. Take breaks every hour if you are driving for long periods of time.  Sleep on your side with your knees slightly bent, or sleep on your back with a pillow under your knees. Do not lie on the front of your body to sleep. WHAT SHOULD I KNOW ABOUT LIFTING, TWISTING, AND REACHING? Lifting and Heavy Lifting  Avoid heavy lifting, especially repetitive heavy lifting. If you must do heavy lifting:  Stretch before lifting.  Work slowly.  Rest between lifts.  Use a tool such as a cart or a dolly to move objects if one is available.  Make several small trips instead of carrying one heavy load.  Ask for help when you need it, especially when moving big objects.  Follow these steps when lifting:  Stand with your feet shoulder-width apart.  Get as close to the object as you can. Do not try to pick up a heavy object that is far from your body.  Use handles or lifting straps if they are available.  Bend at your knees. Squat down, but keep your heels off the floor.  Keep your shoulders pulled back, your chin tucked in, and your back straight.  Lift the object slowly while you tighten the muscles in your legs, abdomen, and buttocks. Keep the object as close to the center of your body as possible.  Follow these steps when putting down a heavy load:  Stand with your feet shoulder-width apart.  Lower the object slowly while you tighten the muscles in your legs, abdomen, and buttocks. Keep the object as close to the center of your body as possible.  Keep your shoulders pulled back, your chin tucked in, and your back straight.  Bend at your knees. Squat  down, but keep your heels off the floor.  Use handles or lifting straps if they are available. Twisting and Reaching  Avoid lifting heavy objects above your waist.  Do not twist at your waist while you are lifting or carrying a load. If you need to turn, move your feet.  Do not bend over without bending at your knees.  Avoid reaching over your head, across a table, or for an object on a high surface. WHAT ARE SOME OTHER TIPS?  Avoid wet floors and icy ground. Keep sidewalks clear of ice to prevent falls.  Do not sleep on a mattress that is too soft or too hard.  Keep items that are used frequently within easy reach.  Put heavier objects on shelves at waist level, and put lighter objects on lower or higher shelves.  Find ways to decrease your stress, such as exercise, massage, or relaxation techniques. Stress can build up in your muscles. Tense muscles are more vulnerable to injury.  Talk with your health care provider if you feel anxious or depressed. These conditions can make back pain worse.  Wear flat heel shoes with cushioned soles.  Avoid sudden movements.  Use both shoulder straps when carrying a backpack.  Do not use any tobacco products, including cigarettes, chewing tobacco, or electronic cigarettes. If you need help quitting, ask your health care provider.   This information is not intended to replace advice given to you by your health care provider. Make sure you discuss any questions you have with your health care provider.   Document Released: 02/22/2004 Document Revised: 05/31/2014 Document Reviewed: 01/18/2014 Elsevier Interactive Patient Education [employer].

## 2015-05-15 NOTE — MAU Note (Signed)
Pt states she lifted something heavy yesterday, has been having lower abd & back pain ever since.  Denies vaginal bleeding.

## 2015-06-01 ENCOUNTER — Emergency Department (HOSPITAL_COMMUNITY)
Admission: EM | Admit: 2015-06-01 | Discharge: 2015-06-02 | Disposition: A | Discharge status: Home / Self Care | Payer: Medicaid Other | Attending: Emergency Medicine | Admitting: Emergency Medicine

## 2015-06-01 ENCOUNTER — Encounter (HOSPITAL_COMMUNITY): Payer: Self-pay | Admitting: Emergency Medicine

## 2015-06-01 DIAGNOSIS — S0181XA Laceration without foreign body of other part of head, initial encounter: Secondary | ICD-10-CM | POA: Diagnosis not present

## 2015-06-01 DIAGNOSIS — S61210A Laceration without foreign body of right index finger without damage to nail, initial encounter: Secondary | ICD-10-CM | POA: Diagnosis not present

## 2015-06-01 DIAGNOSIS — Y9289 Other specified places as the place of occurrence of the external cause: Secondary | ICD-10-CM | POA: Insufficient documentation

## 2015-06-01 DIAGNOSIS — Z87891 Personal history of nicotine dependence: Secondary | ICD-10-CM | POA: Diagnosis not present

## 2015-06-01 DIAGNOSIS — Z3A20 20 weeks gestation of pregnancy: Secondary | ICD-10-CM | POA: Insufficient documentation

## 2015-06-01 DIAGNOSIS — O9A212 Injury, poisoning and certain other consequences of external causes complicating pregnancy, second trimester: Secondary | ICD-10-CM | POA: Insufficient documentation

## 2015-06-01 DIAGNOSIS — Z3490 Encounter for supervision of normal pregnancy, unspecified, unspecified trimester: Secondary | ICD-10-CM

## 2015-06-01 DIAGNOSIS — Z8619 Personal history of other infectious and parasitic diseases: Secondary | ICD-10-CM | POA: Insufficient documentation

## 2015-06-01 DIAGNOSIS — Y9389 Activity, other specified: Secondary | ICD-10-CM | POA: Diagnosis not present

## 2015-06-01 DIAGNOSIS — Z87440 Personal history of urinary (tract) infections: Secondary | ICD-10-CM | POA: Insufficient documentation

## 2015-06-01 DIAGNOSIS — Y998 Other external cause status: Secondary | ICD-10-CM | POA: Diagnosis not present
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
## 2015-06-02 NOTE — Discharge Instructions (Signed)
1. Medications: usual home medications 2. Treatment: rest, drink plenty of fluids, keep wounds clean with warm soap and water 3. Follow Up: Please followup with your primary doctor in 2-3 days for discussion of your diagnoses and further evaluation after today's visit; if you do not have a primary care doctor use the resource guide provided to find one; Please return to the ER for worsening symptoms including signs of infection abdominal pain or vaginal bleeding

## 2015-07-06 ENCOUNTER — Encounter (HOSPITAL_COMMUNITY): Payer: Self-pay | Admitting: *Deleted

## 2015-07-06 ENCOUNTER — Inpatient Hospital Stay (HOSPITAL_COMMUNITY)
Admission: AD | Admit: 2015-07-06 | Discharge: 2015-07-08 | DRG: 782 | Disposition: A | Discharge status: Home / Self Care | Payer: Medicaid Other | Source: Ambulatory Visit | Attending: Obstetrics & Gynecology | Admitting: Obstetrics & Gynecology

## 2015-07-06 ENCOUNTER — Inpatient Hospital Stay (HOSPITAL_COMMUNITY): Payer: Medicaid Other

## 2015-07-06 DIAGNOSIS — O09892 Supervision of other high risk pregnancies, second trimester: Secondary | ICD-10-CM

## 2015-07-06 DIAGNOSIS — O4592 Premature separation of placenta, unspecified, second trimester: Principal | ICD-10-CM | POA: Diagnosis present

## 2015-07-06 DIAGNOSIS — Z87891 Personal history of nicotine dependence: Secondary | ICD-10-CM

## 2015-07-06 DIAGNOSIS — Z3A24 24 weeks gestation of pregnancy: Secondary | ICD-10-CM

## 2015-07-06 DIAGNOSIS — O4690 Antepartum hemorrhage, unspecified, unspecified trimester: Secondary | ICD-10-CM | POA: Insufficient documentation

## 2015-07-06 DIAGNOSIS — O26852 Spotting complicating pregnancy, second trimester: Secondary | ICD-10-CM | POA: Diagnosis present

## 2015-07-06 DIAGNOSIS — O4590 Premature separation of placenta, unspecified, unspecified trimester: Secondary | ICD-10-CM | POA: Diagnosis present

## 2015-07-06 DIAGNOSIS — O0990 Supervision of high risk pregnancy, unspecified, unspecified trimester: Secondary | ICD-10-CM

## 2015-07-06 LAB — URINALYSIS, ROUTINE W REFLEX MICROSCOPIC
Bilirubin Urine: NEGATIVE
GLUCOSE, UA: NEGATIVE mg/dL
KETONES UR: NEGATIVE mg/dL
LEUKOCYTES UA: NEGATIVE
NITRITE: NEGATIVE
PROTEIN: NEGATIVE mg/dL
Specific Gravity, Urine: <1.005 — ABNORMAL LOW (ref 1.005–1.030)
pH: 6.5 (ref 5.0–8.0)

## 2015-07-06 LAB — RAPID URINE DRUG SCREEN, HOSP PERFORMED
Amphetamines: NOT DETECTED
Barbiturates: NOT DETECTED
Benzodiazepines: NOT DETECTED
COCAINE: NOT DETECTED
OPIATES: NOT DETECTED
TETRAHYDROCANNABINOL: NOT DETECTED

## 2015-07-06 LAB — WET PREP, GENITAL
CLUE CELLS WET PREP: NONE SEEN
Sperm: NONE SEEN
TRICH WET PREP: NONE SEEN
Yeast Wet Prep HPF POC: NONE SEEN

## 2015-07-06 LAB — TYPE AND SCREEN
ABO/RH(D): A POS
ANTIBODY SCREEN: NEGATIVE

## 2015-07-06 LAB — CBC
HCT: 33.9 % — ABNORMAL LOW (ref 36.0–46.0)
HEMOGLOBIN: 11.6 g/dL — AB (ref 12.0–15.0)
MCH: 30.2 pg (ref 26.0–34.0)
MCHC: 34.2 g/dL (ref 30.0–36.0)
MCV: 88.3 fL (ref 78.0–100.0)
PLATELETS: 271 10*3/uL (ref 150–400)
RBC: 3.84 MIL/uL — ABNORMAL LOW (ref 3.87–5.11)
RDW: 13.5 % (ref 11.5–15.5)
WBC: 8.9 10*3/uL (ref 4.0–10.5)

## 2015-07-06 LAB — URINE MICROSCOPIC-ADD ON

## 2015-07-06 LAB — RAPID HIV SCREEN (HIV 1/2 AB+AG)
HIV 1/2 ANTIBODIES: NONREACTIVE
HIV-1 P24 Antigen - HIV24: NONREACTIVE

## 2015-07-06 MED ORDER — CALCIUM CARBONATE ANTACID 500 MG PO CHEW: 2.0000 | CHEWABLE_TABLET | ORAL | Status: DC | PRN

## 2015-07-06 MED ORDER — BETAMETHASONE SOD PHOS & ACET 6 (3-3) MG/ML IJ SUSP
12.0000 mg | INTRAMUSCULAR | Status: AC
  Administered 2015-07-06 – 2015-07-07 (×2): 12 mg via INTRAMUSCULAR
  Filled 2015-07-06 (×2): qty 2

## 2015-07-06 MED ORDER — DOCUSATE SODIUM 100 MG PO CAPS
100.0000 mg | ORAL_CAPSULE | Freq: Every day | ORAL | Status: DC
  Administered 2015-07-06 – 2015-07-08 (×3): 100 mg via ORAL
  Filled 2015-07-06 (×3): qty 1

## 2015-07-06 MED ORDER — ZOLPIDEM TARTRATE 5 MG PO TABS: 5.0000 mg | ORAL_TABLET | Freq: Every evening | ORAL | Status: DC | PRN

## 2015-07-06 MED ORDER — ACETAMINOPHEN 325 MG PO TABS
650.0000 mg | ORAL_TABLET | ORAL | Status: DC | PRN
  Administered 2015-07-07: 650 mg via ORAL
  Filled 2015-07-06: qty 2

## 2015-07-06 MED ORDER — PRENATAL MULTIVITAMIN CH
1.0000 | ORAL_TABLET | Freq: Every day | ORAL | Status: DC
  Administered 2015-07-06 – 2015-07-08 (×3): 1 via ORAL
  Filled 2015-07-06 (×3): qty 1

## 2015-07-06 NOTE — H&P (Signed)
ANTEPARTUM ADMISSION HISTORY AND PHYSICAL NOTE   History of Present Illness: Julie Cline is a 20 y.o. G1P0000 at 8082w1d presenting with vaginal spotting beginning this morning. Mild pelvic pain. No fevers or dysuria. Last sex 3 weeks ago, nothing recently in the vagina. Treated for trich and chlamydia in March. No LOF.   Patient Active Problem List   Diagnosis Date Noted  . Placental abruption 07/06/2015  . UTI (lower urinary tract infection) 12/30/2013    Past Medical History  Diagnosis Date  . Chlamydia   . Urinary tract infection     Past Surgical History  Procedure Laterality Date  . Hernia repair    . Eye surgery      Eye lid    OB History  Gravida Para Term Preterm AB SAB TAB Ectopic Multiple Living  1 0 0 0 0 0 0 0 0 0     # Outcome Date GA Lbr Len/2nd Weight Sex Delivery Anes PTL Lv  1 Current               Social History   Social History  . Marital Status: Single    Spouse Name: N/A  . Number of Children: N/A  . Years of Education: N/A   Social History Main Topics  . Smoking status: Former Smoker -- 0.25 packs/day for 6 years    Types: Cigarettes    Quit date: 01/28/2015  . Smokeless tobacco: None  . Alcohol Use: No     Comment: Pt says she drank prior to pregnancy  . Drug Use: No  . Sexual Activity: Yes    Birth Control/ Protection: None   Other Topics Concern  . None   Social History Narrative    Family History  Problem Relation Age of Onset  . Alcohol abuse Neg Hx   . Arthritis Neg Hx   . Asthma Neg Hx   . Birth defects Neg Hx   . Cancer Neg Hx   . COPD Neg Hx   . Depression Neg Hx   . Diabetes Neg Hx   . Drug abuse Neg Hx   . Early death Neg Hx   . Hearing loss Neg Hx   . Heart disease Neg Hx   . Hyperlipidemia Neg Hx   . Hypertension Neg Hx   . Kidney disease Neg Hx   . Learning disabilities Neg Hx   . Mental illness Neg Hx   . Mental retardation Neg Hx   . Miscarriages / Stillbirths Neg Hx   . Stroke Neg Hx   .  Vision loss Neg Hx   . Varicose Veins Neg Hx     No Known Allergies  Prescriptions prior to admission  Medication Sig Dispense Refill Last Dose  . azithromycin (ZITHROMAX) 500 MG tablet Take two tablets by mouth once. 2 tablet 0   . cyclobenzaprine (FLEXERIL) 10 MG tablet Take 1 tablet (10 mg total) by mouth 3 (three) times daily as needed for muscle spasms. 30 tablet 0     Review of Systems - Negative except as per hpi  Vitals:  BP 122/60 mmHg  Pulse 76  Temp(Src) 98.2 F (36.8 C) (Oral)  Resp 18  Ht 5\' 6"  (1.676 m)  Wt 173 lb 6.4 oz (78.654 kg)  BMI 28.00 kg/m2  LMP 01/18/2015 Physical Examination: CONSTITUTIONAL: Well-developed, well-nourished female in no acute distress.  HENT:  Normocephalic, atraumatic, External right and left ear normal. Oropharynx is clear and moist EYES: Conjunctivae and EOM are normal. Pupils  are equal, round, and reactive to light. No scleral icterus.  NECK: Normal range of motion, supple, no masses SKIN: Skin is warm and dry. No rash noted. Not diaphoretic. No erythema. No pallor. NEUROLGIC: Alert and oriented to person, place, and time. Normal reflexes, muscle tone coordination. No cranial nerve deficit noted. PSYCHIATRIC: Normal mood and affect. Normal behavior. Normal judgment and thought content. CARDIOVASCULAR: Normal heart rate noted, regular rhythm RESPIRATORY: Effort and breath sounds normal, no problems with respiration noted ABDOMEN: Soft, nontender, nondistended, gravid. MUSCULOSKELETAL: Normal range of motion. No edema and no tenderness. 2+ distal pulses.  GU: small amount bright red blood extruding from visually closed cervix, no inflammation of cervix. Scant mucous discharge. Membranes:intact Fetal Monitoring: 130s Tocometer: Flat  Labs:  Results for orders placed or performed during the hospital encounter of 07/06/15 (from the past 24 hour(s))  Urinalysis, Routine w reflex microscopic (not at [facility])   Collection Time: 07/06/15  12:05 PM  Result Value Ref Range   Color, Urine YELLOW YELLOW   APPearance CLEAR CLEAR   Specific Gravity, Urine <1.005 (L) 1.005 - 1.030   pH 6.5 5.0 - 8.0   Glucose, UA NEGATIVE NEGATIVE mg/dL   Hgb urine dipstick TRACE (A) NEGATIVE   Bilirubin Urine NEGATIVE NEGATIVE   Ketones, ur NEGATIVE NEGATIVE mg/dL   Protein, ur NEGATIVE NEGATIVE mg/dL   Nitrite NEGATIVE NEGATIVE   Leukocytes, UA NEGATIVE NEGATIVE  Urine microscopic-add on   Collection Time: 07/06/15 12:05 PM  Result Value Ref Range   Squamous Epithelial / LPF 0-5 (A) NONE SEEN   WBC, UA 0-5 0 - 5 WBC/hpf   RBC / HPF 0-5 0 - 5 RBC/hpf   Bacteria, UA RARE (A) NONE SEEN  Urine rapid drug screen (hosp performed)   Collection Time: 07/06/15 12:05 PM  Result Value Ref Range   Opiates NONE DETECTED NONE DETECTED   Cocaine NONE DETECTED NONE DETECTED   Benzodiazepines NONE DETECTED NONE DETECTED   Amphetamines NONE DETECTED NONE DETECTED   Tetrahydrocannabinol NONE DETECTED NONE DETECTED   Barbiturates NONE DETECTED NONE DETECTED  Wet prep, genital   Collection Time: 07/06/15  1:00 PM  Result Value Ref Range   Yeast Wet Prep HPF POC NONE SEEN NONE SEEN   Trich, Wet Prep NONE SEEN NONE SEEN   Clue Cells Wet Prep HPF POC NONE SEEN NONE SEEN   WBC, Wet Prep HPF POC MANY (A) NONE SEEN   Sperm NONE SEEN   CBC   Collection Time: 07/06/15  1:16 PM  Result Value Ref Range   WBC 8.9 4.0 - 10.5 K/uL   RBC 3.84 (L) 3.87 - 5.11 MIL/uL   Hemoglobin 11.6 (L) 12.0 - 15.0 g/dL   HCT 16.1 (L) 09.6 - 04.5 %   MCV 88.3 78.0 - 100.0 fL   MCH 30.2 26.0 - 34.0 pg   MCHC 34.2 30.0 - 36.0 g/dL   RDW 40.9 81.1 - 91.4 %   Platelets 271 150 - 400 K/uL  Rapid HIV screen (HIV 1/2 Ab+Ag)   Collection Time: 07/06/15  1:16 PM  Result Value Ref Range   HIV-1 P24 Antigen - HIV24 NON REACTIVE NON REACTIVE   HIV 1/2 Antibodies NON REACTIVE NON REACTIVE   Interpretation (HIV Ag Ab)      A non reactive test result means that HIV 1 or HIV 2  antibodies and HIV 1 p24 antigen were not detected in the specimen.  Type and screen [facility]   Collection Time:  07/06/15  1:16 PM  Result Value Ref Range   ABO/RH(D) A POS    Antibody Screen NEG    Sample Expiration 07/09/2015     Imaging Studies: No results found.   Assessment and Plan:  # Possible abruption - mild amount of bleeding, pt reporting some pelvic pain but no tenderness on exam. FHTs wnl. U/s showing no previa, no signs abruption. Rh positive. Hemodynamically stable, not anemic. Urinalysis not suggestive of infection. Wet prep unremarkable. UDS negative - admit to antenatal for monitoring - f/u gonorrhea, chlamydia, rpr, and hiv - f/u prenatal panel (hep b, rubella) - bmz now and again in 24  # Trich, chlamydia positive - in march. Treated. - wet prep unremarkable - f/u G/C  Admit to Antenatal Routine antenatal care  Silvano Bilis, MD OB fellow Faculty Practice, [facility]
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
## 2015-07-07 DIAGNOSIS — O4592 Premature separation of placenta, unspecified, second trimester: Principal | ICD-10-CM

## 2015-07-07 DIAGNOSIS — Z3A24 24 weeks gestation of pregnancy: Secondary | ICD-10-CM

## 2015-07-07 LAB — RPR: RPR Ser Ql: NONREACTIVE

## 2015-07-07 LAB — GC/CHLAMYDIA PROBE AMP (~~LOC~~) NOT AT ARMC
CHLAMYDIA, DNA PROBE: NEGATIVE
Neisseria Gonorrhea: NEGATIVE

## 2015-07-07 LAB — RUBELLA SCREEN: Rubella: 1.16 index (ref >0.99)

## 2015-07-07 LAB — HEPATITIS B SURFACE ANTIGEN: Hepatitis B Surface Ag: NEGATIVE

## 2015-07-07 NOTE — Progress Notes (Signed)
FACULTY PRACTICE ANTEPARTUM COMPREHENSIVE PROGRESS NOTE  Julie Cline is a 20 y.o. G1P0000 at 2576w2d who is admitted for vaginal bleeding/presumed abruption.  Estimated Date of Delivery: 10/25/15 Fetal presentation is cephalic.  Length of Stay:  1 Days. Admitted 07/06/2015  Subjective: No further bleeding since admission Patient reports good fetal movement.  She reports no uterine contractions and no loss of fluid per vagina.  Vitals:  Blood pressure 125/58, pulse 74, temperature 97.9 F (36.6 C), temperature source Oral, resp. rate 16, height 5\' 5"  (1.651 m), weight 175 lb (79.379 kg), last menstrual period 01/18/2015. Physical Examination: CONSTITUTIONAL: Well-developed, well-nourished female in no acute distress.  HENT:  Normocephalic, atraumatic, External right and left ear normal. Oropharynx is clear and moist EYES: Conjunctivae and EOM are normal. Pupils are equal, round, and reactive to light. No scleral icterus.  NECK: Normal range of motion, supple, no masses SKIN: Skin is warm and dry. No rash noted. Not diaphoretic. No erythema. No pallor. NEUROLGIC: Alert and oriented to person, place, and time. Normal reflexes, muscle tone coordination. No cranial nerve deficit noted. PSYCHIATRIC: Normal mood and affect. Normal behavior. Normal judgment and thought content. CARDIOVASCULAR: Normal heart rate noted, regular rhythm RESPIRATORY: Effort and breath sounds normal, no problems with respiration noted MUSCULOSKELETAL: Normal range of motion. No edema and no tenderness. 2+ distal pulses. ABDOMEN: Soft, nontender, nondistended, gravid. CERVIX:  Deferred  Fetal monitoring: FHR: 140 bpm, Variability: moderate Uterine activity: No contractions  Results for orders placed or performed during the hospital encounter of 07/06/15 (from the past 48 hour(s))  Urinalysis, Routine w reflex microscopic (not [facility])     Status: Abnormal   Collection Time: 07/06/15 12:05 PM  Result Value Ref  Range   Color, Urine YELLOW YELLOW   APPearance CLEAR CLEAR   Specific Gravity, Urine <1.005 (L) 1.005 - 1.030   pH 6.5 5.0 - 8.0   Glucose, UA NEGATIVE NEGATIVE mg/dL   Hgb urine dipstick TRACE (A) NEGATIVE   Bilirubin Urine NEGATIVE NEGATIVE   Ketones, ur NEGATIVE NEGATIVE mg/dL   Protein, ur NEGATIVE NEGATIVE mg/dL   Nitrite NEGATIVE NEGATIVE   Leukocytes, UA NEGATIVE NEGATIVE  Urine microscopic-add on     Status: Abnormal   Collection Time: 07/06/15 12:05 PM  Result Value Ref Range   Squamous Epithelial / LPF 0-5 (A) NONE SEEN   WBC, UA 0-5 0 - 5 WBC/hpf   RBC / HPF 0-5 0 - 5 RBC/hpf   Bacteria, UA RARE (A) NONE SEEN  Urine rapid drug screen (hosp performed)     Status: None   Collection Time: 07/06/15 12:05 PM  Result Value Ref Range   Opiates NONE DETECTED NONE DETECTED   Cocaine NONE DETECTED NONE DETECTED   Benzodiazepines NONE DETECTED NONE DETECTED   Amphetamines NONE DETECTED NONE DETECTED   Tetrahydrocannabinol NONE DETECTED NONE DETECTED   Barbiturates NONE DETECTED NONE DETECTED    Comment:        DRUG SCREEN FOR MEDICAL PURPOSES ONLY.  IF CONFIRMATION IS NEEDED FOR ANY PURPOSE, NOTIFY LAB WITHIN 5 DAYS.        LOWEST DETECTABLE LIMITS FOR URINE DRUG SCREEN Drug Class       Cutoff (ng/mL) Amphetamine      1000 Barbiturate      200 Benzodiazepine   200 Tricyclics       300 Opiates          300 Cocaine          300 THC  50   Wet prep, genital     Status: Abnormal   Collection Time: 07/06/15  1:00 PM  Result Value Ref Range   Yeast Wet Prep HPF POC NONE SEEN NONE SEEN   Trich, Wet Prep NONE SEEN NONE SEEN   Clue Cells Wet Prep HPF POC NONE SEEN NONE SEEN   WBC, Wet Prep HPF POC MANY (A) NONE SEEN   Sperm NONE SEEN   CBC     Status: Abnormal   Collection Time: 07/06/15  1:16 PM  Result Value Ref Range   WBC 8.9 4.0 - 10.5 K/uL   RBC 3.84 (L) 3.87 - 5.11 MIL/uL   Hemoglobin 11.6 (L) 12.0 - 15.0 g/dL   HCT 60.4 (L) 54.0 - 98.1 %   MCV  88.3 78.0 - 100.0 fL   MCH 30.2 26.0 - 34.0 pg   MCHC 34.2 30.0 - 36.0 g/dL   RDW 19.1 47.8 - 29.5 %   Platelets 271 150 - 400 K/uL  Type and screen [facility]     Status: None   Collection Time: 07/06/15  1:16 PM  Result Value Ref Range   ABO/RH(D) A POS    Antibody Screen NEG    Sample Expiration 07/09/2015   RPR     Status: None   Collection Time: 07/06/15  1:16 PM  Result Value Ref Range   RPR Ser Ql Non Reactive Non Reactive    Comment: (NOTE) Performed At: [facility] [address] [place], [place] [id number] Mila Homer MD QI:[id number]   Rapid HIV screen (HIV 1/2 Ab+Ag)     Status: None   Collection Time: 07/06/15  1:16 PM  Result Value Ref Range   HIV-1 P24 Antigen - HIV24 NON REACTIVE NON REACTIVE   HIV 1/2 Antibodies NON REACTIVE NON REACTIVE   Interpretation (HIV Ag Ab)      A non reactive test result means that HIV 1 or HIV 2 antibodies and HIV 1 p24 antigen were not detected in the specimen.  Hepatitis B surface antigen     Status: None   Collection Time: 07/06/15  1:16 PM  Result Value Ref Range   Hepatitis B Surface Ag Negative Negative    Comment: (NOTE) Performed At: [facility] [address] [place], [place] [id number] Mila Homer MD OZ:[id number]   Rubella screen     Status: None   Collection Time: 07/06/15  1:16 PM  Result Value Ref Range   Rubella 1.16 Immune >0.99 index    Comment: (NOTE)                                Non-immune       <0.90                                Equivocal  0.90 - 0.99                                Immune           >0.99 Performed At: [facility] [address] [place], [place] [id number] Mila Homer MD [id number]     [place] Mfm Ob Limited  07/07/2015  OBSTETRICAL ULTRASOUND: No previa, no visible abruption  Current scheduled medications . betamethasone acetate-betamethasone sodium phosphate  12  mg Intramuscular Q24H  . docusate sodium  100 mg Oral  Daily  . prenatal multivitamin  1 tablet Oral Q1200    I have reviewed the patient's current medications.  ASSESSMENT: Principal Problem:   Placental abruption  PLAN: Continue observation for atleast 48 hours 2nd dose of betamethsone today Bleeding precautions reviewed Continue routine antenatal care.   Jaynie Collins, MD, FACOG Attending Obstetrician & Gynecologist Faculty Practice, [facility]

## 2015-07-08 DIAGNOSIS — O0990 Supervision of high risk pregnancy, unspecified, unspecified trimester: Secondary | ICD-10-CM

## 2015-07-08 DIAGNOSIS — O26852 Spotting complicating pregnancy, second trimester: Secondary | ICD-10-CM

## 2015-07-08 DIAGNOSIS — O4690 Antepartum hemorrhage, unspecified, unspecified trimester: Secondary | ICD-10-CM | POA: Insufficient documentation

## 2015-07-08 LAB — CULTURE, BETA STREP (GROUP B ONLY)

## 2015-07-08 NOTE — Discharge Summary (Signed)
Physician Discharge Summary  Patient ID: Julie Cline MRN: [id number] DOB/AGE: 1995-04-16 20 y.o.  Admit date: 07/06/2015 Discharge date: 07/08/2015  Admission Diagnoses: Patient Active Problem List   Diagnosis Date Noted  . Supervision of high risk pregnancy, antepartum 07/08/2015  . Placental abruption 07/06/2015  . UTI (lower urinary tract infection) 12/30/2013  9571w3d    Discharge Diagnoses:  Principal Problem:   Placental abruption Active Problems:   Supervision of high risk pregnancy, antepartum   Discharged Condition: good  Hospital Course:  History of Present Illness: Julie Cline is a 20 y.o. G1P0000 at 8777w1d presenting with vaginal spotting beginning this morning. Mild pelvic pain. No fevers or dysuria. Last sex 3 weeks ago, nothing recently in the vagina. Treated for trich and chlamydia in March. No LOF.   Patient Active Problem List   Diagnosis Date Noted  . Placental abruption 07/06/2015  . UTI (lower urinary tract infection) 12/30/2013    Past Medical History  Diagnosis Date  . Chlamydia   . Urinary tract infection     Past Surgical History  Procedure Laterality Date  . Hernia repair    . Eye surgery      Eye lid    OB History  Gravida Para Term Preterm AB SAB TAB Ectopic Multiple Living  1 0 0 0 0 0 0 0 0 0     # Outcome Date GA Lbr Len/2nd Weight Sex Delivery Anes PTL Lv  1 Current               Social History   Social History  . Marital Status: Single    Spouse Name: N/A  . Number of Children: N/A  . Years of Education: N/A   Social History Main Topics  . Smoking status: Former Smoker -- 0.25 packs/day for 6 years    Types: Cigarettes    Quit date: 01/28/2015  . Smokeless tobacco: None  . Alcohol Use: No     Comment: Pt says she drank prior to pregnancy  . Drug Use: No  . Sexual Activity:  Yes    Birth Control/ Protection: None   Other Topics Concern  . None   Social History Narrative    Family History  Problem Relation Age of Onset  . Alcohol abuse Neg Hx   . Arthritis Neg Hx   . Asthma Neg Hx   . Birth defects Neg Hx   . Cancer Neg Hx   . COPD Neg Hx   . Depression Neg Hx   . Diabetes Neg Hx   . Drug abuse Neg Hx   . Early death Neg Hx   . Hearing loss Neg Hx   . Heart disease Neg Hx   . Hyperlipidemia Neg Hx   . Hypertension Neg Hx   . Kidney disease Neg Hx   . Learning disabilities Neg Hx   . Mental illness Neg Hx   . Mental retardation Neg Hx   . Miscarriages / Stillbirths Neg Hx   . Stroke Neg Hx   . Vision loss Neg Hx   . Varicose Veins Neg Hx     No Known Allergies  Prescriptions prior to admission  Medication Sig Dispense Refill Last Dose  . azithromycin (ZITHROMAX) 500 MG tablet Take two tablets by mouth once. 2 tablet 0   . cyclobenzaprine (FLEXERIL) 10 MG tablet Take 1 tablet (10 mg total) by mouth 3 (three) times daily as needed for muscle spasms. 30 tablet 0     Review  of Systems - Negative except as per hpi  Vitals: BP 122/60 mmHg  Pulse 76  Temp(Src) 98.2 F (36.8 C) (Oral)  Resp 18  Ht  (1.676 m)  Wt 173 lb 6.4 oz (78.654 kg)  BMI 28.00 kg/m2  LMP 01/18/2015 Physical Examination: CONSTITUTIONAL: Well-developed, well-nourished female in no acute distress.  HENT: Normocephalic, atraumatic, External right and left ear normal. Oropharynx is clear and moist EYES: Conjunctivae and EOM are normal. Pupils are equal, round, and reactive to light. No scleral icterus.  NECK: Normal range of motion, supple, no masses SKIN: Skin is warm and dry. No rash noted. Not diaphoretic. No erythema. No pallor. NEUROLGIC: Alert and oriented to person, place, and time. Normal reflexes, muscle tone coordination. No  cranial nerve deficit noted. PSYCHIATRIC: Normal mood and affect. Normal behavior. Normal judgment and thought content. CARDIOVASCULAR: Normal heart rate noted, regular rhythm RESPIRATORY: Effort and breath sounds normal, no problems with respiration noted ABDOMEN: Soft, nontender, nondistended, gravid. MUSCULOSKELETAL: Normal range of motion. No edema and no tenderness. 2+ distal pulses.  GU: small amount bright red blood extruding from visually closed cervix, no inflammation of cervix. Scant mucous discharge. Membranes:intact Fetal Monitoring: 130s Tocometer: Flat  Labs:  Results for orders placed or performed during the hospital encounter of 07/06/15 (from the past 24 hour(s))  Urinalysis, Routine w reflex microscopic (not at [facility])   Collection Time: 07/06/15 12:05 PM  Result Value Ref Range   Color, Urine YELLOW YELLOW   APPearance CLEAR CLEAR   Specific Gravity, Urine <1.005 (L) 1.005 - 1.030   pH 6.5 5.0 - 8.0   Glucose, UA NEGATIVE NEGATIVE mg/dL   Hgb urine dipstick TRACE (A) NEGATIVE   Bilirubin Urine NEGATIVE NEGATIVE   Ketones, ur NEGATIVE NEGATIVE mg/dL   Protein, ur NEGATIVE NEGATIVE mg/dL   Nitrite NEGATIVE NEGATIVE   Leukocytes, UA NEGATIVE NEGATIVE  Urine microscopic-add on   Collection Time: 07/06/15 12:05 PM  Result Value Ref Range   Squamous Epithelial / LPF 0-5 (A) NONE SEEN   WBC, UA 0-5 0 - 5 WBC/hpf   RBC / HPF 0-5 0 - 5 RBC/hpf   Bacteria, UA RARE (A) NONE SEEN  Urine rapid drug screen (hosp performed)   Collection Time: 07/06/15 12:05 PM  Result Value Ref Range   Opiates NONE DETECTED NONE DETECTED   Cocaine NONE DETECTED NONE DETECTED   Benzodiazepines NONE DETECTED NONE DETECTED   Amphetamines NONE DETECTED NONE DETECTED   Tetrahydrocannabinol NONE DETECTED NONE DETECTED   Barbiturates NONE DETECTED NONE DETECTED  Wet prep, genital    Collection Time: 07/06/15 1:00 PM  Result Value Ref Range   Yeast Wet Prep HPF POC NONE SEEN NONE SEEN   Trich, Wet Prep NONE SEEN NONE SEEN   Clue Cells Wet Prep HPF POC NONE SEEN NONE SEEN   WBC, Wet Prep HPF POC MANY (A) NONE SEEN   Sperm NONE SEEN   CBC   Collection Time: 07/06/15 1:16 PM  Result Value Ref Range   WBC 8.9 4.0 - 10.5 K/uL   RBC 3.84 (L) 3.87 - 5.11 MIL/uL   Hemoglobin 11.6 (L) 12.0 - 15.0 g/dL   HCT 56.2 (L) 13.0 - 86.5 %   MCV 88.3 78.0 - 100.0 fL   MCH 30.2 26.0 - 34.0 pg   MCHC 34.2 30.0 - 36.0 g/dL   RDW 78.4 69.6 - 29.5 %   Platelets 271 150 - 400 K/uL  Rapid HIV screen (HIV 1/2 Ab+Ag)   Collection  Time: 07/06/15 1:16 PM  Result Value Ref Range   HIV-1 P24 Antigen - HIV24 NON REACTIVE NON REACTIVE   HIV 1/2 Antibodies NON REACTIVE NON REACTIVE   Interpretation (HIV Ag Ab)      A non reactive test result means that HIV 1 or HIV 2 antibodies and HIV 1 p24 antigen were not detected in the specimen.  Type and screen [facility] [place]   Collection Time: 07/06/15 1:16 PM  Result Value Ref Range   ABO/RH(D) A POS    Antibody Screen NEG    Sample Expiration 07/09/2015     Imaging Studies:  Imaging Results    No results found.     Assessment and Plan:  # Possible abruption - mild amount of bleeding, pt reporting some pelvic pain but no tenderness on exam. FHTs wnl. U/s showing no previa, no signs abruption. Rh positive. Hemodynamically stable, not anemic. Urinalysis not suggestive of infection. Wet prep unremarkable. UDS negative - admit to antenatal for monitoring - f/u gonorrhea, chlamydia, rpr, and hiv - f/u prenatal panel (hep b, rubella) - bmz now and again in 24  # Trich, chlamydia positive - in march. Treated. - wet prep unremarkable - f/u G/C  Admit to Antenatal Routine antenatal care  Silvano Bilis, MD OB  fellow Faculty Practice, [facility] day 3 no vaginal bleeding or pain and reassure fetal monitoring  Consults: None  Significant Diagnostic Studies: radiology: Ultrasound: normal fluid no hemorrhage noted  Treatments: IV hydration  Discharge Exam: Blood pressure 129/62, pulse 66, temperature 98 F (36.7 C), temperature source Oral, resp. rate 16, height 5\' 5"  (1.651 m), weight 79.379 kg (175 lb), last menstrual period 01/18/2015. General appearance: alert, cooperative and no distress GI: soft, non-tender; bowel sounds normal; no masses,  no organomegaly Extremities: extremities normal, atraumatic, no cyanosis or edema  Disposition: 01-Home or Self Care     Medication List    TAKE these medications        [employer] PRENATAL GUMMY PO  Take 2 tablets by mouth daily.           Follow-up Information    Follow up with [facility] In 4 days.   Specialty:  Obstetrics and Gynecology   Contact information:   [address] [place] [address] [phone]      Signed: Scheryl Darter 07/08/2015, 7:05 AM

## 2015-07-08 NOTE — Discharge Instructions (Signed)
Vaginal Bleeding During Pregnancy, Second Trimester A small amount of bleeding (spotting) from the vagina is relatively common in pregnancy. It usually stops on its own. Various things can cause bleeding or spotting in pregnancy. Some bleeding may be related to the pregnancy, and some may not. Sometimes the bleeding is normal and is not a problem. However, bleeding can also be a sign of something serious. Be sure to tell your health care provider about any vaginal bleeding right away. Some possible causes of vaginal bleeding during the second trimester include:  Infection, inflammation, or growths on the cervix.   The placenta may be partially or completely covering the opening of the cervix inside the uterus (placenta previa).  The placenta may have separated from the uterus (abruption of the placenta).   You may be having early (preterm) labor.   The cervix may not be strong enough to keep a baby inside the uterus (cervical insufficiency).   Tiny cysts may have developed in the uterus instead of pregnancy tissue (molar pregnancy). HOME CARE INSTRUCTIONS  Watch your condition for any changes. The following actions may help to lessen any discomfort you are feeling:  Follow your health care provider's instructions for limiting your activity. If your health care provider orders bed rest, you may need to stay in bed and only get up to use the bathroom. However, your health care provider may allow you to continue light activity.  If needed, make plans for someone to help with your regular activities and responsibilities while you are on bed rest.  Keep track of the number of pads you use each day, how often you change pads, and how soaked (saturated) they are. Write this down.  Do not use tampons. Do not douche.  Do not have sexual intercourse or orgasms until approved by your health care provider.  If you pass any tissue from your vagina, save the tissue so you can show it to your  health care provider.  Only take over-the-counter or prescription medicines as directed by your health care provider.  Do not take aspirin because it can make you bleed.  Do not exercise or perform any strenuous activities or heavy lifting without your health care provider's permission.  Keep all follow-up appointments as directed by your health care provider. SEEK MEDICAL CARE IF:  You have any vaginal bleeding during any part of your pregnancy.  You have cramps or labor pains.  You have a fever, not controlled by medicine. SEEK IMMEDIATE MEDICAL CARE IF:   You have severe cramps in your back or belly (abdomen).  You have contractions.  You have chills.  You pass large clots or tissue from your vagina.  Your bleeding increases.  You feel light-headed or weak, or you have fainting episodes.  You are leaking fluid or have a gush of fluid from your vagina. MAKE SURE YOU:  Understand these instructions.  Will watch your condition.  Will get help right away if you are not doing well or get worse.   This information is not intended to replace advice given to you by your health care provider. Make sure you discuss any questions you have with your health care provider.   Document Released: 10/24/2004 Document Revised: 01/19/2013 Document Reviewed: 09/21/2012 Elsevier Interactive Patient Education 2016 Elsevier Inc.  

## 2015-07-13 ENCOUNTER — Encounter: Payer: Self-pay | Admitting: Medical

## 2015-07-13 ENCOUNTER — Ambulatory Visit (INDEPENDENT_AMBULATORY_CARE_PROVIDER_SITE_OTHER): Payer: Medicaid Other | Admitting: Medical

## 2015-07-13 VITALS — BP 133/67 | HR 88 | Wt 172.8 lb

## 2015-07-13 DIAGNOSIS — O0992 Supervision of high risk pregnancy, unspecified, second trimester: Secondary | ICD-10-CM | POA: Diagnosis not present

## 2015-07-13 LAB — POCT URINALYSIS DIP (DEVICE)
Bilirubin Urine: NEGATIVE
GLUCOSE, UA: NEGATIVE mg/dL
Hgb urine dipstick: NEGATIVE
KETONES UR: NEGATIVE mg/dL
LEUKOCYTES UA: NEGATIVE
Nitrite: NEGATIVE
PROTEIN: NEGATIVE mg/dL
Specific Gravity, Urine: 1.02 (ref 1.005–1.030)
Urobilinogen, UA: 0.2 mg/dL (ref 0.0–1.0)
pH: 6 (ref 5.0–8.0)

## 2015-07-13 NOTE — Patient Instructions (Signed)
Second Trimester of Pregnancy  The second trimester is from week 13 through week 28, month 4 through 6. This is often the time in pregnancy that you feel your best. Often times, morning sickness has lessened or quit. You may have more energy, and you may get hungry more often. Your unborn baby (fetus) is growing rapidly. At the end of the sixth month, he or she is about 9 inches long and weighs about 1½ pounds. You will likely feel the baby move (quickening) between 18 and 20 weeks of pregnancy.  HOME CARE   · Avoid all smoking, herbs, and alcohol. Avoid drugs not approved by your doctor.  · Do not use any tobacco products, including cigarettes, chewing tobacco, and electronic cigarettes. If you need help quitting, ask your doctor. You may get counseling or other support to help you quit.  · Only take medicine as told by your doctor. Some medicines are safe and some are not during pregnancy.  · Exercise only as told by your doctor. Stop exercising if you start having cramps.  · Eat regular, healthy meals.  · Wear a good support bra if your breasts are tender.  · Do not use hot tubs, steam rooms, or saunas.  · Wear your seat belt when driving.  · Avoid raw meat, uncooked cheese, and liter boxes and soil used by cats.  · Take your prenatal vitamins.  · Take 1500-2000 milligrams of calcium daily starting at the 20th week of pregnancy until you deliver your baby.  · Try taking medicine that helps you poop (stool softener) as needed, and if your doctor approves. Eat more fiber by eating fresh fruit, vegetables, and whole grains. Drink enough fluids to keep your pee (urine) clear or pale yellow.  · Take warm water baths (sitz baths) to soothe pain or discomfort caused by hemorrhoids. Use hemorrhoid cream if your doctor approves.  · If you have puffy, bulging veins (varicose veins), wear support hose. Raise (elevate) your feet for 15 minutes, 3-4 times a day. Limit salt in your diet.  · Avoid heavy lifting, wear low heals,  and sit up straight.  · Rest with your legs raised if you have leg cramps or low back pain.  · Visit your dentist if you have not gone during your pregnancy. Use a soft toothbrush to brush your teeth. Be gentle when you floss.  · You can have sex (intercourse) unless your doctor tells you not to.  · Go to your doctor visits.  GET HELP IF:   · You feel dizzy.  · You have mild cramps or pressure in your lower belly (abdomen).  · You have a nagging pain in your belly area.  · You continue to feel sick to your stomach (nauseous), throw up (vomit), or have watery poop (diarrhea).  · You have bad smelling fluid coming from your vagina.  · You have pain with peeing (urination).  GET HELP RIGHT AWAY IF:   · You have a fever.  · You are leaking fluid from your vagina.  · You have spotting or bleeding from your vagina.  · You have severe belly cramping or pain.  · You lose or gain weight rapidly.  · You have trouble catching your breath and have chest pain.  · You notice sudden or extreme puffiness (swelling) of your face, hands, ankles, feet, or legs.  · You have not felt the baby move in over an hour.  · You have severe headaches that do   not go away with medicine.  · You have vision changes.     This information is not intended to replace advice given to you by your health care provider. Make sure you discuss any questions you have with your health care provider.     Document Released: 04/10/2009 Document Revised: 02/04/2014 Document Reviewed: 03/17/2012  Elsevier Interactive Patient Education ©2016 Elsevier Inc.

## 2015-07-13 NOTE — Progress Notes (Signed)
Subjective:  Julie Cline is a 20 y.o. G1P0000 at 7854w1d being seen today for initial prenatal care visit.  She is currently monitored for the following issues for this low-risk pregnancy and has Placental abruption; Supervision of high risk pregnancy, antepartum; and Vaginal bleeding in pregnancy on her problem list. The patient was admitted last week for an initial episode of vaginal bleeding.   Patient reports no complaints.  Contractions: Not present. Vag. Bleeding: None.  Movement: Present. Denies leaking of fluid.   The following portions of the patient's history were reviewed and updated as appropriate: allergies, current medications, past family history, past medical history, past social history, past surgical history and problem list. Problem list updated.  Objective:   Filed Vitals:   07/13/15 1320  BP: 133/67  Pulse: 88  Weight: 172 lb 12.8 oz (78.382 kg)    Fetal Status: Fetal Heart Rate (bpm): 152 Fundal Height: 24 cm Movement: Present     General:  Alert, oriented and cooperative. Patient is in no acute distress.  Skin: Skin is warm and dry. No rash noted.   Cardiovascular: Normal heart rate noted  Respiratory: Normal respiratory effort, no problems with respiration noted  Abdomen: Soft, gravid, appropriate for gestational age. Pain/Pressure: Absent     Pelvic: Cervical exam deferred        Extremities: Normal range of motion.  Edema: Trace  Mental Status: Normal mood and affect. Normal behavior. Normal judgment and thought content.   Urinalysis: Urine Protein: Negative Urine Glucose: Negative  Assessment and Plan:  Pregnancy: G1P0000 at 5854w1d  1. Supervision of high risk pregnancy, antepartum, second trimester - US MFM OB COMP + 14 WK; scheduled for 07/17/15  Preterm labor symptoms and general obstetric precautions including but not limited to vaginal bleeding, contractions, leaking of fluid and fetal movement were reviewed in detail with the patient. Please  refer to After Visit Summary for other counseling recommendations.  [facility] (around [facility]03/2015[facility]   Marny LowensteinJulie N Wenzel, PA-C

## 2015-07-17 ENCOUNTER — Encounter (HOSPITAL_COMMUNITY): Payer: Self-pay

## 2015-07-17 ENCOUNTER — Ambulatory Visit (HOSPITAL_COMMUNITY)
Admission: RE | Admit: 2015-07-17 | Discharge: 2015-07-17 | Disposition: A | Discharge status: Home / Self Care | Payer: Medicaid Other | Source: Ambulatory Visit | Attending: Medical | Admitting: Medical

## 2015-07-17 DIAGNOSIS — O0932 Supervision of pregnancy with insufficient antenatal care, second trimester: Secondary | ICD-10-CM | POA: Insufficient documentation

## 2015-07-17 DIAGNOSIS — O4592 Premature separation of placenta, unspecified, second trimester: Secondary | ICD-10-CM | POA: Diagnosis not present

## 2015-07-17 DIAGNOSIS — O0992 Supervision of high risk pregnancy, unspecified, second trimester: Secondary | ICD-10-CM

## 2015-07-17 DIAGNOSIS — Z3A25 25 weeks gestation of pregnancy: Secondary | ICD-10-CM | POA: Insufficient documentation

## 2015-07-18 ENCOUNTER — Other Ambulatory Visit (HOSPITAL_COMMUNITY): Payer: Self-pay | Admitting: *Deleted

## 2015-07-18 DIAGNOSIS — O0930 Supervision of pregnancy with insufficient antenatal care, unspecified trimester: Secondary | ICD-10-CM

## 2015-08-04 ENCOUNTER — Ambulatory Visit (INDEPENDENT_AMBULATORY_CARE_PROVIDER_SITE_OTHER): Payer: Medicaid Other | Admitting: Certified Nurse Midwife

## 2015-08-04 VITALS — BP 110/62 | HR 63 | Wt 179.9 lb

## 2015-08-04 DIAGNOSIS — O0992 Supervision of high risk pregnancy, unspecified, second trimester: Secondary | ICD-10-CM | POA: Diagnosis not present

## 2015-08-04 LAB — CBC
HCT: 33.7 % — ABNORMAL LOW (ref 35.0–45.0)
HEMOGLOBIN: 11.1 g/dL — AB (ref 11.7–15.5)
MCH: 30 pg (ref 27.0–33.0)
MCHC: 32.9 g/dL (ref 32.0–36.0)
MCV: 91.1 fL (ref 80.0–100.0)
MPV: 9.7 fL (ref 7.5–12.5)
PLATELETS: 254 10*3/uL (ref 140–400)
RBC: 3.7 MIL/uL — AB (ref 3.80–5.10)
RDW: 13.3 % (ref 11.0–15.0)
WBC: 8.5 10*3/uL (ref 3.8–10.8)

## 2015-08-04 LAB — POCT URINALYSIS DIP (DEVICE)
BILIRUBIN URINE: NEGATIVE
Glucose, UA: NEGATIVE mg/dL
HGB URINE DIPSTICK: NEGATIVE
KETONES UR: NEGATIVE mg/dL
Nitrite: NEGATIVE
Protein, ur: NEGATIVE mg/dL
SPECIFIC GRAVITY, URINE: 1.025 (ref 1.005–1.030)
UROBILINOGEN UA: 0.2 mg/dL (ref 0.0–1.0)
pH: 6 (ref 5.0–8.0)

## 2015-08-04 NOTE — Progress Notes (Signed)
Urine: small amt wbcs 28 week labs today Tdap deferred to next visit

## 2015-08-04 NOTE — Progress Notes (Signed)
Subjective:  Julie Cline is a 20 y.o. G1P0000 at 5537w2d being seen today for ongoing prenatal care.  She is currently monitored for the following issues for this high-risk pregnancy and has Placental abruption; Supervision of high risk pregnancy, antepartum; and Vaginal bleeding in pregnancy on her problem list.  Patient reports no complaints.  Contractions: Not present. Vag. Bleeding: None.  Movement: Present. Denies leaking of fluid.   The following portions of the patient's history were reviewed and updated as appropriate: allergies, current medications, past family history, past medical history, past social history, past surgical history and problem list. Problem list updated.  Objective:   Filed Vitals:   08/04/15 0822  BP: 110/62  Pulse: 63  Weight: 179 lb 14.4 oz (81.602 kg)    Fetal Status: Fetal Heart Rate (bpm): 130 Fundal Height: 29 cm Movement: Present  Presentation: Transverse  General:  Alert, oriented and cooperative. Patient is in no acute distress.  Skin: Skin is warm and dry. No rash noted.   Cardiovascular: Normal heart rate noted  Respiratory: Normal respiratory effort, no problems with respiration noted  Abdomen: Soft, gravid, appropriate for gestational age. Pain/Pressure: Absent     Pelvic: Vag. Bleeding: None     Cervical exam deferred        Extremities: Normal range of motion.  Edema: None  Mental Status: Normal mood and affect. Normal behavior. Normal judgment and thought content.   Urinalysis: Urine Protein: Negative Urine Glucose: Negative  Assessment and Plan:  Pregnancy: G1P0000 at 3737w2d  1. Supervision of high risk pregnancy, antepartum, third trimester - interval US for growth in 2 wks - Glucose Tolerance, 1 HR (50g) w/o Fasting - HIV antibody (with reflex) - RPR - CBC  Preterm labor symptoms and general obstetric precautions including but not limited to vaginal bleeding, contractions, leaking of fluid and fetal movement were reviewed in  detail with the patient. Please refer to After Visit Summary for other counseling recommendations.  Return in about 2 weeks (around 08/18/2015).   Donette LarryMelanie Nannie Starzyk, CNM

## 2015-08-05 LAB — RPR

## 2015-08-05 LAB — GLUCOSE TOLERANCE, 1 HOUR (50G) W/O FASTING: Glucose, 1 Hr, gestational: 97 mg/dL (ref <140)

## 2015-08-05 LAB — HIV ANTIBODY (ROUTINE TESTING W REFLEX): HIV 1&2 Ab, 4th Generation: NONREACTIVE

## 2015-08-14 ENCOUNTER — Other Ambulatory Visit (HOSPITAL_COMMUNITY): Payer: Self-pay | Admitting: Maternal and Fetal Medicine

## 2015-08-14 ENCOUNTER — Encounter (HOSPITAL_COMMUNITY): Payer: Self-pay

## 2015-08-14 ENCOUNTER — Ambulatory Visit (HOSPITAL_COMMUNITY)
Admission: RE | Admit: 2015-08-14 | Discharge: 2015-08-14 | Disposition: A | Discharge status: Home / Self Care | Payer: Medicaid Other | Source: Ambulatory Visit | Attending: Medical | Admitting: Medical

## 2015-08-14 DIAGNOSIS — O0930 Supervision of pregnancy with insufficient antenatal care, unspecified trimester: Secondary | ICD-10-CM

## 2015-08-14 DIAGNOSIS — Z3A29 29 weeks gestation of pregnancy: Secondary | ICD-10-CM | POA: Insufficient documentation

## 2015-08-14 DIAGNOSIS — O365930 Maternal care for other known or suspected poor fetal growth, third trimester, not applicable or unspecified: Secondary | ICD-10-CM

## 2015-08-14 DIAGNOSIS — O0933 Supervision of pregnancy with insufficient antenatal care, third trimester: Secondary | ICD-10-CM

## 2015-08-14 DIAGNOSIS — O4593 Premature separation of placenta, unspecified, third trimester: Secondary | ICD-10-CM | POA: Diagnosis present

## 2015-08-18 ENCOUNTER — Ambulatory Visit (INDEPENDENT_AMBULATORY_CARE_PROVIDER_SITE_OTHER): Payer: Medicaid Other | Admitting: Obstetrics & Gynecology

## 2015-08-18 VITALS — BP 119/63 | HR 71 | Wt 186.0 lb

## 2015-08-18 DIAGNOSIS — O0992 Supervision of high risk pregnancy, unspecified, second trimester: Secondary | ICD-10-CM

## 2015-08-18 DIAGNOSIS — Z23 Encounter for immunization: Secondary | ICD-10-CM

## 2015-08-18 DIAGNOSIS — O0993 Supervision of high risk pregnancy, unspecified, third trimester: Secondary | ICD-10-CM | POA: Diagnosis not present

## 2015-08-18 LAB — POCT URINALYSIS DIP (DEVICE)
BILIRUBIN URINE: NEGATIVE
Glucose, UA: NEGATIVE mg/dL
HGB URINE DIPSTICK: NEGATIVE
KETONES UR: NEGATIVE mg/dL
Nitrite: NEGATIVE
PH: 6 (ref 5.0–8.0)
PROTEIN: NEGATIVE mg/dL
Specific Gravity, Urine: 1.015 (ref 1.005–1.030)
Urobilinogen, UA: 0.2 mg/dL (ref 0.0–1.0)

## 2015-08-18 NOTE — Progress Notes (Signed)
Subjective:  Julie Cline is a 20 y.o. S AA G1P0000 (son)  at 6548w2d being seen today for ongoing prenatal care.  She is currently monitored for the following issues for this low-risk pregnancy and has Placental abruption; Supervision of high risk pregnancy, antepartum; and Vaginal bleeding in pregnancy on her problem list.  Patient reports no complaints.  Contractions: Not present. Vag. Bleeding: None.  Movement: Present. Denies leaking of fluid.   The following portions of the patient's history were reviewed and updated as appropriate: allergies, current medications, past family history, past medical history, past social history, past surgical history and problem list. Problem list updated.  Objective:   Filed Vitals:   08/18/15 0849  BP: 119/63  Pulse: 71  Weight: 186 lb (84.369 kg)    Fetal Status: Fetal Heart Rate (bpm): 134 Fundal Height: 30 cm Movement: Present     General:  Alert, oriented and cooperative. Patient is in no acute distress.  Skin: Skin is warm and dry. No rash noted.   Cardiovascular: Normal heart rate noted  Respiratory: Normal respiratory effort, no problems with respiration noted  Abdomen: Soft, gravid, appropriate for gestational age. Pain/Pressure: Absent     Pelvic:  Cervical exam deferred        Extremities: Normal range of motion.  Edema: None  Mental Status: Normal mood and affect. Normal behavior. Normal judgment and thought content.   Urinalysis: Urine Protein: Negative Urine Glucose: Negative  Assessment and Plan:  Pregnancy: G1P0000 at 6848w2d  1. Supervision of high risk pregnancy in third trimester  - Tdap vaccine greater than or equal to 7yo IM; Standing - Tdap vaccine greater than or equal to 7yo IM  2. Supervision of high risk pregnancy, antepartum, second trimester   Preterm labor symptoms and general obstetric precautions including but not limited to vaginal bleeding, contractions, leaking of fluid and fetal movement were reviewed  in detail with the patient. Please refer to After Visit Summary for other counseling recommendations.  Return in about 3 years (around 08/18/2018).   Allie BossierMyra C Noely Kuhnle, MD

## 2015-09-07 ENCOUNTER — Ambulatory Visit (INDEPENDENT_AMBULATORY_CARE_PROVIDER_SITE_OTHER): Payer: Medicaid Other | Admitting: Family Medicine

## 2015-09-07 VITALS — HR 72 | Wt 191.2 lb

## 2015-09-07 DIAGNOSIS — O4593 Premature separation of placenta, unspecified, third trimester: Secondary | ICD-10-CM

## 2015-09-07 DIAGNOSIS — O0993 Supervision of high risk pregnancy, unspecified, third trimester: Secondary | ICD-10-CM

## 2015-09-07 LAB — POCT URINALYSIS DIP (DEVICE)
BILIRUBIN URINE: NEGATIVE
Glucose, UA: NEGATIVE mg/dL
HGB URINE DIPSTICK: NEGATIVE
KETONES UR: NEGATIVE mg/dL
Nitrite: NEGATIVE
Protein, ur: NEGATIVE mg/dL
SPECIFIC GRAVITY, URINE: 1.01 (ref 1.005–1.030)
Urobilinogen, UA: 0.2 mg/dL (ref 0.0–1.0)
pH: 6.5 (ref 5.0–8.0)

## 2015-09-07 NOTE — Patient Instructions (Signed)
Third Trimester of Pregnancy The third trimester is from week 29 through week 42, months 7 through 9. The third trimester is a time when the fetus is growing rapidly. At the end of the ninth month, the fetus is about 20 inches in length and weighs 6-10 pounds.  BODY CHANGES Your body goes through many changes during pregnancy. The changes vary from woman to woman.   Your weight will continue to increase. You can expect to gain 25-35 pounds (11-16 kg) by the end of the pregnancy.  You may begin to get stretch marks on your hips, abdomen, and breasts.  You may urinate more often because the fetus is moving lower into your pelvis and pressing on your bladder.  You may develop or continue to have heartburn as a result of your pregnancy.  You may develop constipation because certain hormones are causing the muscles that push waste through your intestines to slow down.  You may develop hemorrhoids or swollen, bulging veins (varicose veins).  You may have pelvic pain because of the weight gain and pregnancy hormones relaxing your joints between the bones in your pelvis. Backaches may result from overexertion of the muscles supporting your posture.  You may have changes in your hair. These can include thickening of your hair, rapid growth, and changes in texture. Some women also have hair loss during or after pregnancy, or hair that feels dry or thin. Your hair will most likely return to normal after your baby is born.  Your breasts will continue to grow and be tender. A yellow discharge may leak from your breasts called colostrum.  Your belly button may stick out.  You may feel short of breath because of your expanding uterus.  You may notice the fetus "dropping," or moving lower in your abdomen.  You may have a bloody mucus discharge. This usually occurs a few days to a week before labor begins.  Your cervix becomes thin and soft (effaced) near your due date. WHAT TO EXPECT AT YOUR  PRENATAL EXAMS  You will have prenatal exams every 2 weeks until week 36. Then, you will have weekly prenatal exams. During a routine prenatal visit:  You will be weighed to make sure you and the fetus are growing normally.  Your blood pressure is taken.  Your abdomen will be measured to track your baby's growth.  The fetal heartbeat will be listened to.  Any test results from the previous visit will be discussed.  You may have a cervical check near your due date to see if you have effaced. At around 36 weeks, your caregiver will check your cervix. At the same time, your caregiver will also perform a test on the secretions of the vaginal tissue. This test is to determine if a type of bacteria, Group B streptococcus, is present. Your caregiver will explain this further. Your caregiver may ask you:  What your birth plan is.  How you are feeling.  If you are feeling the baby move.  If you have had any abnormal symptoms, such as leaking fluid, bleeding, severe headaches, or abdominal cramping.  If you are using any tobacco products, including cigarettes, chewing tobacco, and electronic cigarettes.  If you have any questions. Other tests or screenings that may be performed during your third trimester include:  Blood tests that check for low iron levels (anemia).  Fetal testing to check the health, activity level, and growth of the fetus. Testing is done if you have certain medical conditions or if   there are problems during the pregnancy.  HIV (human immunodeficiency virus) testing. If you are at high risk, you may be screened for HIV during your third trimester of pregnancy. FALSE LABOR You may feel small, irregular contractions that eventually go away. These are called Braxton Hicks contractions, or false labor. Contractions may last for hours, days, or even weeks before true labor sets in. If contractions come at regular intervals, intensify, or become painful, it is best to be seen  by your caregiver.  SIGNS OF LABOR   Menstrual-like cramps.  Contractions that are 5 minutes apart or less.  Contractions that start on the top of the uterus and spread down to the lower abdomen and back.  A sense of increased pelvic pressure or back pain.  A watery or bloody mucus discharge that comes from the vagina. If you have any of these signs before the 37th week of pregnancy, call your caregiver right away. You need to go to the hospital to get checked immediately. HOME CARE INSTRUCTIONS   Avoid all smoking, herbs, alcohol, and unprescribed drugs. These chemicals affect the formation and growth of the baby.  Do not use any tobacco products, including cigarettes, chewing tobacco, and electronic cigarettes. If you need help quitting, ask your health care provider. You may receive counseling support and other resources to help you quit.  Follow your caregiver's instructions regarding medicine use. There are medicines that are either safe or unsafe to take during pregnancy.  Exercise only as directed by your caregiver. Experiencing uterine cramps is a good sign to stop exercising.  Continue to eat regular, healthy meals.  Wear a good support bra for breast tenderness.  Do not use hot tubs, steam rooms, or saunas.  Wear your seat belt at all times when driving.  Avoid raw meat, uncooked cheese, cat litter boxes, and soil used by cats. These carry germs that can cause birth defects in the baby.  Take your prenatal vitamins.  Take 1500-2000 mg of calcium daily starting at the 20th week of pregnancy until you deliver your baby.  Try taking a stool softener (if your caregiver approves) if you develop constipation. Eat more high-fiber foods, such as fresh vegetables or fruit and whole grains. Drink plenty of fluids to keep your urine clear or pale yellow.  Take warm sitz baths to soothe any pain or discomfort caused by hemorrhoids. Use hemorrhoid cream if your caregiver  approves.  If you develop varicose veins, wear support hose. Elevate your feet for 15 minutes, 3-4 times a day. Limit salt in your diet.  Avoid heavy lifting, wear low heal shoes, and practice good posture.  Rest a lot with your legs elevated if you have leg cramps or low back pain.  Visit your dentist if you have not gone during your pregnancy. Use a soft toothbrush to brush your teeth and be gentle when you floss.  A sexual relationship may be continued unless your caregiver directs you otherwise.  Do not travel far distances unless it is absolutely necessary and only with the approval of your caregiver.  Take prenatal classes to understand, practice, and ask questions about the labor and delivery.  Make a trial run to the hospital.  Pack your hospital bag.  Prepare the baby's nursery.  Continue to go to all your prenatal visits as directed by your caregiver. SEEK MEDICAL CARE IF:  You are unsure if you are in labor or if your water has broken.  You have dizziness.  You have   mild pelvic cramps, pelvic pressure, or nagging pain in your abdominal area.  You have persistent nausea, vomiting, or diarrhea.  You have a bad smelling vaginal discharge.  You have pain with urination. SEEK IMMEDIATE MEDICAL CARE IF:   You have a fever.  You are leaking fluid from your vagina.  You have spotting or bleeding from your vagina.  You have severe abdominal cramping or pain.  You have rapid weight loss or gain.  You have shortness of breath with chest pain.  You notice sudden or extreme swelling of your face, hands, ankles, feet, or legs.  You have not felt your baby move in over an hour.  You have severe headaches that do not go away with medicine.  You have vision changes.   This information is not intended to replace advice given to you by your health care provider. Make sure you discuss any questions you have with your health care provider.   Document Released:  01/08/2001 Document Revised: 02/04/2014 Document Reviewed: 03/17/2012 Elsevier Interactive Patient Education 2016 [employer]  Breastfeeding Deciding to breastfeed is one of the best choices you can make for you and your baby. A change in hormones during pregnancy causes your breast tissue to grow and increases the number and size of your milk ducts. These hormones also allow proteins, sugars, and fats from your blood supply to make breast milk in your milk-producing glands. Hormones prevent breast milk from being released before your baby is born as well as prompt milk flow after birth. Once breastfeeding has begun, thoughts of your baby, as well as his or her sucking or crying, can stimulate the release of milk from your milk-producing glands.  BENEFITS OF BREASTFEEDING For Your Baby  Your first milk (colostrum) helps your baby's digestive system function better.  There are antibodies in your milk that help your baby fight off infections.  Your baby has a lower incidence of asthma, allergies, and sudden infant death syndrome.  The nutrients in breast milk are better for your baby than infant formulas and are designed uniquely for your baby's needs.  Breast milk improves your baby's brain development.  Your baby is less likely to develop other conditions, such as childhood obesity, asthma, or type 2 diabetes mellitus. For You  Breastfeeding helps to create a very special bond between you and your baby.  Breastfeeding is convenient. Breast milk is always available at the correct temperature and costs nothing.  Breastfeeding helps to burn calories and helps you lose the weight gained during pregnancy.  Breastfeeding makes your uterus contract to its prepregnancy size faster and slows bleeding (lochia) after you give birth.   Breastfeeding helps to lower your risk of developing type 2 diabetes mellitus, osteoporosis, and breast or ovarian cancer later in life. SIGNS THAT YOUR BABY IS  HUNGRY Early Signs of Hunger  Increased alertness or activity.  Stretching.  Movement of the head from side to side.  Movement of the head and opening of the mouth when the corner of the mouth or cheek is stroked (rooting).  Increased sucking sounds, smacking lips, cooing, sighing, or squeaking.  Hand-to-mouth movements.  Increased sucking of fingers or hands. Late Signs of Hunger  Fussing.  Intermittent crying. Extreme Signs of Hunger Signs of extreme hunger will require calming and consoling before your baby will be able to breastfeed successfully. Do not wait for the following signs of extreme hunger to occur before you initiate breastfeeding:  Restlessness.  A loud, strong cry.  Screaming.   BREASTFEEDING BASICS Breastfeeding Initiation  Find a comfortable place to sit or lie down, with your neck and back well supported.  Place a pillow or rolled up blanket under your baby to bring him or her to the level of your breast (if you are seated). Nursing pillows are specially designed to help support your arms and your baby while you breastfeed.  Make sure that your baby's abdomen is facing your abdomen.  Gently massage your breast. With your fingertips, massage from your chest wall toward your nipple in a circular motion. This encourages milk flow. You may need to continue this action during the feeding if your milk flows slowly.  Support your breast with 4 fingers underneath and your thumb above your nipple. Make sure your fingers are well away from your nipple and your baby's mouth.  Stroke your baby's lips gently with your finger or nipple.  When your baby's mouth is open wide enough, quickly bring your baby to your breast, placing your entire nipple and as much of the colored area around your nipple (areola) as possible into your baby's mouth.  More areola should be visible above your baby's upper lip than below the lower lip.  Your baby's tongue should be between his  or her lower gum and your breast.  Ensure that your baby's mouth is correctly positioned around your nipple (latched). Your baby's lips should create a seal on your breast and be turned out (everted).  It is common for your baby to suck about 2-3 minutes in order to start the flow of breast milk. Latching Teaching your baby how to latch on to your breast properly is very important. An improper latch can cause nipple pain and decreased milk supply for you and poor weight gain in your baby. Also, if your baby is not latched onto your nipple properly, he or she may swallow some air during feeding. This can make your baby fussy. Burping your baby when you switch breasts during the feeding can help to get rid of the air. However, teaching your baby to latch on properly is still the best way to prevent fussiness from swallowing air while breastfeeding. Signs that your baby has successfully latched on to your nipple:  Silent tugging or silent sucking, without causing you pain.  Swallowing heard between every 3-4 sucks.  Muscle movement above and in front of his or her ears while sucking. Signs that your baby has not successfully latched on to nipple:  Sucking sounds or smacking sounds from your baby while breastfeeding.  Nipple pain. If you think your baby has not latched on correctly, slip your finger into the corner of your baby's mouth to break the suction and place it between your baby's gums. Attempt breastfeeding initiation again. Signs of Successful Breastfeeding Signs from your baby:  A gradual decrease in the number of sucks or complete cessation of sucking.  Falling asleep.  Relaxation of his or her body.  Retention of a small amount of milk in his or her mouth.  Letting go of your breast by himself or herself. Signs from you:  Breasts that have increased in firmness, weight, and size 1-3 hours after feeding.  Breasts that are softer immediately after  breastfeeding.  Increased milk volume, as well as a change in milk consistency and color by the fifth day of breastfeeding.  Nipples that are not sore, cracked, or bleeding. Signs That Your Baby is Getting Enough Milk  Wetting at least 3 diapers in a 24-hour period.   The urine should be clear and pale yellow by age 5 days.  At least 3 stools in a 24-hour period by age 5 days. The stool should be soft and yellow.  At least 3 stools in a 24-hour period by age 7 days. The stool should be seedy and yellow.  No loss of weight greater than 10% of birth weight during the first 3 days of age.  Average weight gain of 4-7 ounces (113-198 g) per week after age 4 days.  Consistent daily weight gain by age 5 days, without weight loss after the age of 2 weeks. After a feeding, your baby may spit up a small amount. This is common. BREASTFEEDING FREQUENCY AND DURATION Frequent feeding will help you make more milk and can prevent sore nipples and breast engorgement. Breastfeed when you feel the need to reduce the fullness of your breasts or when your baby shows signs of hunger. This is called "breastfeeding on demand." Avoid introducing a pacifier to your baby while you are working to establish breastfeeding (the first 4-6 weeks after your baby is born). After this time you may choose to use a pacifier. Research has shown that pacifier use during the first year of a baby's life decreases the risk of sudden infant death syndrome (SIDS). Allow your baby to feed on each breast as long as he or she wants. Breastfeed until your baby is finished feeding. When your baby unlatches or falls asleep while feeding from the first breast, offer the second breast. Because newborns are often sleepy in the first few weeks of life, you may need to awaken your baby to get him or her to feed. Breastfeeding times will vary from baby to baby. However, the following rules can serve as a guide to help you ensure that your baby is  properly fed:  Newborns (babies 4 weeks of age or younger) may breastfeed every 1-3 hours.  Newborns should not go longer than 3 hours during the day or 5 hours during the night without breastfeeding.  You should breastfeed your baby a minimum of 8 times in a 24-hour period until you begin to introduce solid foods to your baby at around 6 months of age. BREAST MILK PUMPING Pumping and storing breast milk allows you to ensure that your baby is exclusively fed your breast milk, even at times when you are unable to breastfeed. This is especially important if you are going back to work while you are still breastfeeding or when you are not able to be present during feedings. Your lactation consultant can give you guidelines on how long it is safe to store breast milk. A breast pump is a machine that allows you to pump milk from your breast into a sterile bottle. The pumped breast milk can then be stored in a refrigerator or freezer. Some breast pumps are operated by hand, while others use electricity. Ask your lactation consultant which type will work best for you. Breast pumps can be purchased, but some hospitals and breastfeeding support groups lease breast pumps on a monthly basis. A lactation consultant can teach you how to hand express breast milk, if you prefer not to use a pump. CARING FOR YOUR BREASTS WHILE YOU BREASTFEED Nipples can become dry, cracked, and sore while breastfeeding. The following recommendations can help keep your breasts moisturized and healthy:  Avoid using soap on your nipples.  Wear a supportive bra. Although not required, special nursing bras and tank tops are designed to allow access to your   breasts for breastfeeding without taking off your entire bra or top. Avoid wearing underwire-style bras or extremely tight bras.  Air dry your nipples for 3-4minutes after each feeding.  Use only cotton bra pads to absorb leaked breast milk. Leaking of breast milk between feedings  is normal.  Use lanolin on your nipples after breastfeeding. Lanolin helps to maintain your skin's normal moisture barrier. If you use pure lanolin, you do not need to wash it off before feeding your baby again. Pure lanolin is not toxic to your baby. You may also hand express a few drops of breast milk and gently massage that milk into your nipples and allow the milk to air dry. In the first few weeks after giving birth, some women experience extremely full breasts (engorgement). Engorgement can make your breasts feel heavy, warm, and tender to the touch. Engorgement peaks within 3-5 days after you give birth. The following recommendations can help ease engorgement:  Completely empty your breasts while breastfeeding or pumping. You may want to start by applying warm, moist heat (in the shower or with warm water-soaked hand towels) just before feeding or pumping. This increases circulation and helps the milk flow. If your baby does not completely empty your breasts while breastfeeding, pump any extra milk after he or she is finished.  Wear a snug bra (nursing or regular) or tank top for 1-2 days to signal your body to slightly decrease milk production.  Apply ice packs to your breasts, unless this is too uncomfortable for you.  Make sure that your baby is latched on and positioned properly while breastfeeding. If engorgement persists after 48 hours of following these recommendations, contact your health care provider or a lactation consultant. OVERALL HEALTH CARE RECOMMENDATIONS WHILE BREASTFEEDING  Eat healthy foods. Alternate between meals and snacks, eating 3 of each per day. Because what you eat affects your breast milk, some of the foods may make your baby more irritable than usual. Avoid eating these foods if you are sure that they are negatively affecting your baby.  Drink milk, fruit juice, and water to satisfy your thirst (about 10 glasses a day).  Rest often, relax, and continue to take  your prenatal vitamins to prevent fatigue, stress, and anemia.  Continue breast self-awareness checks.  Avoid chewing and smoking tobacco. Chemicals from cigarettes that pass into breast milk and exposure to secondhand smoke may harm your baby.  Avoid alcohol and drug use, including marijuana. Some medicines that may be harmful to your baby can pass through breast milk. It is important to ask your health care provider before taking any medicine, including all over-the-counter and prescription medicine as well as vitamin and herbal supplements. It is possible to become pregnant while breastfeeding. If birth control is desired, ask your health care provider about options that will be safe for your baby. SEEK MEDICAL CARE IF:  You feel like you want to stop breastfeeding or have become frustrated with breastfeeding.  You have painful breasts or nipples.  Your nipples are cracked or bleeding.  Your breasts are red, tender, or warm.  You have a swollen area on either breast.  You have a fever or chills.  You have nausea or vomiting.  You have drainage other than breast milk from your nipples.  Your breasts do not become full before feedings by the fifth day after you give birth.  You feel sad and depressed.  Your baby is too sleepy to eat well.  Your baby is having trouble sleeping.     Your baby is wetting less than 3 diapers in a 24-hour period.  Your baby has less than 3 stools in a 24-hour period.  Your baby's skin or the white part of his or her eyes becomes yellow.   Your baby is not gaining weight by 5 days of age. SEEK IMMEDIATE MEDICAL CARE IF:  Your baby is overly tired (lethargic) and does not want to wake up and feed.  Your baby develops an unexplained fever.   This information is not intended to replace advice given to you by your health care provider. Make sure you discuss any questions you have with your health care provider.   Document Released: 01/14/2005  Document Revised: 10/05/2014 Document Reviewed: 07/08/2012 Elsevier Interactive Patient Education 2016 [employer]  

## 2015-09-07 NOTE — Progress Notes (Signed)
Subjective:  Julie Cline is a 20 y.o. G1P0000 at 1434w1d being seen today for ongoing prenatal care.  She is currently monitored for the following issues for this high-risk pregnancy and has Placental abruption; Supervision of high risk pregnancy, antepartum; and Vaginal bleeding in pregnancy on her problem list.  Patient reports no complaints.  Contractions: Not present. Vag. Bleeding: None.  Movement: Present. Denies leaking of fluid.   The following portions of the patient's history were reviewed and updated as appropriate: allergies, current medications, past family history, past medical history, past social history, past surgical history and problem list. Problem list updated.  Objective:   Vitals:   09/07/15 0928  Pulse: 72  Weight: 191 lb 3.2 oz (86.7 kg)    Fetal Status: Fetal Heart Rate (bpm): 140 Fundal Height: 33 cm Movement: Present  Presentation: Vertex  General:  Alert, oriented and cooperative. Patient is in no acute distress.  Skin: Skin is warm and dry. No rash noted.   Cardiovascular: Normal heart rate noted  Respiratory: Normal respiratory effort, no problems with respiration noted  Abdomen: Soft, gravid, appropriate for gestational age. Pain/Pressure: Present     Pelvic:  Cervical exam deferred        Extremities: Normal range of motion.  Edema: Trace  Mental Status: Normal mood and affect. Normal behavior. Normal judgment and thought content.   Urinalysis: Urine Protein: Negative Urine Glucose: Negative  Assessment and Plan:  Pregnancy: G1P0000 at 1834w1d  1. Supervision of high risk pregnancy, antepartum, third trimester Continue prenatal care.  2. Placental abruption, third trimester No bleeding since hospitalization at 24 wks. Mild growth lag on last u/s--for f/u tomorrow--will await result to see about pursuing further testing.  Preterm labor symptoms and general obstetric precautions including but not limited to vaginal bleeding, contractions, leaking  of fluid and fetal movement were reviewed in detail with the patient. Please refer to After Visit Summary for other counseling recommendations.  Return in about 2 weeks (around 09/21/2015).   Reva Boresanya S Duquan Gillooly, MD

## 2015-09-08 ENCOUNTER — Ambulatory Visit (HOSPITAL_COMMUNITY)
Admission: RE | Admit: 2015-09-08 | Discharge: 2015-09-08 | Disposition: A | Discharge status: Home / Self Care | Payer: Medicaid Other | Source: Ambulatory Visit | Attending: Medical | Admitting: Medical

## 2015-09-08 ENCOUNTER — Encounter (HOSPITAL_COMMUNITY): Payer: Self-pay

## 2015-09-08 DIAGNOSIS — O4593 Premature separation of placenta, unspecified, third trimester: Secondary | ICD-10-CM | POA: Diagnosis not present

## 2015-09-08 DIAGNOSIS — O365930 Maternal care for other known or suspected poor fetal growth, third trimester, not applicable or unspecified: Secondary | ICD-10-CM | POA: Diagnosis present

## 2015-09-08 DIAGNOSIS — Z3A33 33 weeks gestation of pregnancy: Secondary | ICD-10-CM | POA: Insufficient documentation

## 2015-09-27 ENCOUNTER — Encounter: Payer: Self-pay | Admitting: Family Medicine

## 2015-09-27 ENCOUNTER — Encounter: Payer: Medicaid Other | Admitting: Certified Nurse Midwife

## 2015-10-10 ENCOUNTER — Encounter: Payer: Medicaid Other | Admitting: Advanced Practice Midwife

## 2015-10-11 ENCOUNTER — Ambulatory Visit (INDEPENDENT_AMBULATORY_CARE_PROVIDER_SITE_OTHER): Payer: Medicaid Other | Admitting: Family

## 2015-10-11 DIAGNOSIS — O4593 Premature separation of placenta, unspecified, third trimester: Secondary | ICD-10-CM

## 2015-10-11 DIAGNOSIS — O0993 Supervision of high risk pregnancy, unspecified, third trimester: Secondary | ICD-10-CM

## 2015-10-11 LAB — POCT URINALYSIS DIP (DEVICE)
Bilirubin Urine: NEGATIVE
GLUCOSE, UA: NEGATIVE mg/dL
Hgb urine dipstick: NEGATIVE
KETONES UR: NEGATIVE mg/dL
Nitrite: NEGATIVE
PROTEIN: NEGATIVE mg/dL
Specific Gravity, Urine: 1.015 (ref 1.005–1.030)
Urobilinogen, UA: 0.2 mg/dL (ref 0.0–1.0)
pH: 7 (ref 5.0–8.0)

## 2015-10-11 NOTE — Progress Notes (Signed)
   PRENATAL VISIT NOTE  Subjective:  Julie Cline is a 20 y.o. G1P0000 at 152w0d being seen today for ongoing prenatal care.  She is currently monitored for the following issues for this low-risk pregnancy and has resolved placental abruption; Supervision of high risk pregnancy, antepartum; and Vaginal bleeding in pregnancy on her problem list.  Patient reports no complaints.   .  .  Movement: Present. Denies leaking of fluid.   The following portions of the patient's history were reviewed and updated as appropriate: allergies, current medications, past family history, past medical history, past social history, past surgical history and problem list. Problem list updated.  Objective:   Vitals:   10/11/15 1612  BP: 136/64  Pulse: 69  Weight: 199 lb 11.2 oz (90.6 kg)    Fetal Status: Fetal Heart Rate (bpm): 141 Fundal Height: 38 cm Movement: Present  Presentation: Vertex  General:  Alert, oriented and cooperative. Patient is in no acute distress.  Skin: Skin is warm and dry. No rash noted.   Cardiovascular: Normal heart rate noted  Respiratory: Normal respiratory effort, no problems with respiration noted  Abdomen: Soft, gravid, appropriate for gestational age. Pain/Pressure: Present     Pelvic:  Cervical exam deferred        Extremities: Normal range of motion.  Edema: Trace  Mental Status: Normal mood and affect. Normal behavior. Normal judgment and thought content.   Urinalysis: Urine Protein: Negative Urine Glucose: Negative  Assessment and Plan:  Pregnancy: G1P0000 at 5452w0d  1. Supervision of high risk pregnancy, antepartum, third trimester - Reviewed GBS results   2. Placental abruption, third trimester - Resolved at 25 wks; no report of bleeding  Term labor symptoms and general obstetric precautions including but not limited to vaginal bleeding, contractions, leaking of fluid and fetal movement were reviewed in detail with the patient. Please refer to After Visit  Summary for other counseling recommendations.  Return in about 1 week (around 10/18/2015).  Eino FarberWalidah Kennith GainN Karim, CNM

## 2015-10-17 ENCOUNTER — Other Ambulatory Visit (HOSPITAL_COMMUNITY)
Admission: RE | Admit: 2015-10-17 | Discharge: 2015-10-17 | Disposition: A | Discharge status: Home / Self Care | Payer: Medicaid Other | Source: Ambulatory Visit | Attending: Certified Nurse Midwife | Admitting: Certified Nurse Midwife

## 2015-10-17 ENCOUNTER — Ambulatory Visit (INDEPENDENT_AMBULATORY_CARE_PROVIDER_SITE_OTHER): Payer: Medicaid Other | Admitting: Certified Nurse Midwife

## 2015-10-17 VITALS — BP 108/66 | HR 66 | Wt 200.9 lb

## 2015-10-17 DIAGNOSIS — Z113 Encounter for screening for infections with a predominantly sexual mode of transmission: Secondary | ICD-10-CM | POA: Insufficient documentation

## 2015-10-17 DIAGNOSIS — O0993 Supervision of high risk pregnancy, unspecified, third trimester: Secondary | ICD-10-CM

## 2015-10-17 LAB — POCT URINALYSIS DIP (DEVICE)
BILIRUBIN URINE: NEGATIVE
Glucose, UA: NEGATIVE mg/dL
HGB URINE DIPSTICK: NEGATIVE
Ketones, ur: NEGATIVE mg/dL
NITRITE: NEGATIVE
PH: 6.5 (ref 5.0–8.0)
Protein, ur: NEGATIVE mg/dL
Specific Gravity, Urine: 1.025 (ref 1.005–1.030)
Urobilinogen, UA: 0.2 mg/dL (ref 0.0–1.0)

## 2015-10-17 LAB — OB RESULTS CONSOLE GC/CHLAMYDIA: Gonorrhea: NEGATIVE

## 2015-10-17 LAB — OB RESULTS CONSOLE GBS: GBS: NEGATIVE

## 2015-10-17 NOTE — Progress Notes (Signed)
Subjective:  Julie Cline is a 20 y.o. G1P0000 at 6452w6d being seen today for ongoing prenatal care.  She is currently monitored for the following issues for this low-risk pregnancy and has Placental abruption; Supervision of high risk pregnancy, antepartum; and Vaginal bleeding in pregnancy on her problem list.  Patient reports no complaints.  Contractions: Not present. Vag. Bleeding: None.  Movement: Present. Denies leaking of fluid.   The following portions of the patient's history were reviewed and updated as appropriate: allergies, current medications, past family history, past medical history, past social history, past surgical history and problem list. Problem list updated.  Objective:   Vitals:   10/17/15 0948  BP: 108/66  Pulse: 66  Weight: 200 lb 14.4 oz (91.1 kg)    Fetal Status: Fetal Heart Rate (bpm): 144   Movement: Present     General:  Alert, oriented and cooperative. Patient is in no acute distress.  Skin: Skin is warm and dry. No rash noted.   Cardiovascular: Normal heart rate noted  Respiratory: Normal respiratory effort, no problems with respiration noted  Abdomen: Soft, gravid, appropriate for gestational age. Pain/Pressure: Present     Pelvic: Vag. Bleeding: None     Cervical exam performed        Extremities: Normal range of motion.  Edema: Trace  Mental Status: Normal mood and affect. Normal behavior. Normal judgment and thought content.   Urinalysis: Urine Protein: Trace Urine Glucose: Negative  Assessment and Plan:  Pregnancy: G1P0000 at 1752w6d  1. Supervision of high risk pregnancy, antepartum, third trimester - Culture, beta strep (group b only) - GC/Chlamydia probe amp ([facility]  Term labor symptoms and general obstetric precautions including but not limited to vaginal bleeding, contractions, leaking of fluid and fetal movement were reviewed in detail with the patient. Please refer to After Visit Summary for other counseling  recommendations.  Return in about 1 week (around 10/24/2015).   Donette LarryMelanie Crystale Giannattasio, CNM

## 2015-10-18 LAB — GC/CHLAMYDIA PROBE AMP (~~LOC~~) NOT AT ARMC
CHLAMYDIA, DNA PROBE: NEGATIVE
Neisseria Gonorrhea: NEGATIVE

## 2015-10-19 LAB — CULTURE, BETA STREP (GROUP B ONLY)

## 2015-10-24 ENCOUNTER — Encounter (HOSPITAL_COMMUNITY): Payer: Self-pay | Admitting: *Deleted

## 2015-10-24 ENCOUNTER — Inpatient Hospital Stay (HOSPITAL_COMMUNITY)
Admission: AD | Admit: 2015-10-24 | Discharge: 2015-10-24 | Disposition: A | Discharge status: Home / Self Care | Payer: Medicaid Other | Source: Ambulatory Visit | Attending: Family Medicine | Admitting: Family Medicine

## 2015-10-24 DIAGNOSIS — O99613 Diseases of the digestive system complicating pregnancy, third trimester: Secondary | ICD-10-CM

## 2015-10-24 DIAGNOSIS — Z3A39 39 weeks gestation of pregnancy: Secondary | ICD-10-CM | POA: Diagnosis not present

## 2015-10-24 DIAGNOSIS — K529 Noninfective gastroenteritis and colitis, unspecified: Secondary | ICD-10-CM | POA: Diagnosis not present

## 2015-10-24 DIAGNOSIS — O212 Late vomiting of pregnancy: Secondary | ICD-10-CM | POA: Diagnosis present

## 2015-10-24 DIAGNOSIS — Z3689 Encounter for other specified antenatal screening: Secondary | ICD-10-CM

## 2015-10-24 DIAGNOSIS — Z87891 Personal history of nicotine dependence: Secondary | ICD-10-CM | POA: Diagnosis not present

## 2015-10-24 DIAGNOSIS — Z3493 Encounter for supervision of normal pregnancy, unspecified, third trimester: Secondary | ICD-10-CM

## 2015-10-24 LAB — URINALYSIS, ROUTINE W REFLEX MICROSCOPIC
Bilirubin Urine: NEGATIVE
GLUCOSE, UA: NEGATIVE mg/dL
Hgb urine dipstick: NEGATIVE
Ketones, ur: NEGATIVE mg/dL
Nitrite: NEGATIVE
PH: 6.5 (ref 5.0–8.0)
Protein, ur: NEGATIVE mg/dL
SPECIFIC GRAVITY, URINE: 1.01 (ref 1.005–1.030)

## 2015-10-24 LAB — URINE MICROSCOPIC-ADD ON: RBC / HPF: NONE SEEN RBC/hpf (ref 0–5)

## 2015-10-24 MED ORDER — FAMOTIDINE IN NACL 20-0.9 MG/50ML-% IV SOLN
20.0000 mg | Freq: Once | INTRAVENOUS | Status: AC
  Administered 2015-10-24: 20 mg via INTRAVENOUS
  Filled 2015-10-24: qty 50

## 2015-10-24 MED ORDER — SODIUM CHLORIDE 0.9 % IV SOLN
8.0000 mg | Freq: Once | INTRAVENOUS | Status: AC
  Administered 2015-10-24: 8 mg via INTRAVENOUS
  Filled 2015-10-24: qty 4

## 2015-10-24 MED ORDER — ONDANSETRON HCL 4 MG PO TABS: 4.0000 mg | ORAL_TABLET | Freq: Three times a day (TID) | ORAL | 0 refills | Status: DC | PRN

## 2015-10-24 MED ORDER — PROMETHAZINE HCL 25 MG PO TABS
25.0000 mg | ORAL_TABLET | Freq: Four times a day (QID) | ORAL | Status: DC | PRN
  Administered 2015-10-24: 25 mg via ORAL
  Filled 2015-10-24: qty 1

## 2015-10-24 MED ORDER — LACTATED RINGERS IV BOLUS (SEPSIS)
1000.0000 mL | Freq: Once | INTRAVENOUS | Status: AC
  Administered 2015-10-24: 1000 mL via INTRAVENOUS

## 2015-10-24 NOTE — Discharge Instructions (Signed)

## 2015-10-24 NOTE — MAU Note (Addendum)
Has been vomiting and when she does, she can't control her bladder. No diarrhea. No pain. No bleeding. Unable to void at this time

## 2015-10-24 NOTE — MAU Provider Note (Signed)
Chief Complaint:  Emesis   HPI: Julie Cline is a 20 y.o. G1P0000 at [redacted]w[redacted]d who presents to maternity admissions reporting vomiting that started yesterday. Reports this started suddenly yesterday and has vomited several times. Has been able to keep fluids down. Denies abdominal pain or diarrhea. No known sick contact. NO fever or chills.   Denies contractions, leakage of fluid or vaginal bleeding. Good fetal movement.   Pregnancy Course:  - PNC at [facility] - Resolved placental abruption  Past Medical History: Past Medical History:  Diagnosis Date  . Chlamydia   . Urinary tract infection     Past obstetric history: OB History  Gravida Para Term Preterm AB Living  1 0 0 0 0 0  SAB TAB Ectopic Multiple Live Births  0 0 0 0      # Outcome Date GA Lbr Len/2nd Weight Sex Delivery Anes PTL Lv  1 Current               Past Surgical History: Past Surgical History:  Procedure Laterality Date  . EYE SURGERY     Eye lid  . HERNIA REPAIR       Family History: Family History  Problem Relation Age of Onset  . Alcohol abuse Neg Hx   . Arthritis Neg Hx   . Asthma Neg Hx   . Birth defects Neg Hx   . Cancer Neg Hx   . COPD Neg Hx   . Depression Neg Hx   . Diabetes Neg Hx   . Drug abuse Neg Hx   . Early death Neg Hx   . Hearing loss Neg Hx   . Heart disease Neg Hx   . Hyperlipidemia Neg Hx   . Hypertension Neg Hx   . Kidney disease Neg Hx   . Learning disabilities Neg Hx   . Mental illness Neg Hx   . Mental retardation Neg Hx   . Miscarriages / Stillbirths Neg Hx   . Stroke Neg Hx   . Vision loss Neg Hx   . Varicose Veins Neg Hx     Social History: Social History  Substance Use Topics  . Smoking status: Former Smoker    Packs/day: 0.25    Years: 6.00    Types: Cigarettes    Quit date: 01/27/2013  . Smokeless tobacco: Never Used  . Alcohol use No     Comment: Pt says she drank prior to pregnancy    Allergies: No Known Allergies  Meds:  No prescriptions prior  to admission.    I have reviewed patient's Past Medical Hx, Surgical Hx, Family Hx, Social Hx, medications and allergies.   ROS:  A comprehensive ROS was negative except per HPI.    Physical Exam   Patient Vitals for the past 24 hrs:  BP Temp Temp src Pulse Resp Weight  10/24/15 1247 118/71 - - 74 16 -  10/24/15 1052 - 98.9 F (37.2 C) Oral - - -  10/24/15 0812 114/71 98.4 F (36.9 C) Oral 86 18 -  10/24/15 0759 - - - - - 199 lb 4 oz (90.4 kg)   Constitutional: Well-developed, well-nourished female in no acute distress.  Cardiovascular: normal rate, regular rythm Respiratory: normal effort, lungs CTAB GI: Abd soft, non-tender pm all 4 quadrants, gravid appropriate for gestational age.  MS: Extremities nontender, no edema Neurologic: Alert and oriented x 4.  GU: Neg CVAT.  FHT:  Baseline 135 , moderate variability, accelerations present, no decelerations Contractions: none  Labs: Results for orders placed or performed during the hospital encounter of 10/24/15 (from the past 24 hour(s))  Urinalysis, Routine w reflex microscopic (not at [facility])     Status: Abnormal   Collection Time: 10/24/15  8:35 AM  Result Value Ref Range   Color, Urine YELLOW YELLOW   APPearance CLEAR CLEAR   Specific Gravity, Urine 1.010 1.005 - 1.030   pH 6.5 5.0 - 8.0   Glucose, UA NEGATIVE NEGATIVE mg/dL   Hgb urine dipstick NEGATIVE NEGATIVE   Bilirubin Urine NEGATIVE NEGATIVE   Ketones, ur NEGATIVE NEGATIVE mg/dL   Protein, ur NEGATIVE NEGATIVE mg/dL   Nitrite NEGATIVE NEGATIVE   Leukocytes, UA MODERATE (A) NEGATIVE  Urine microscopic-add on     Status: Abnormal   Collection Time: 10/24/15  8:35 AM  Result Value Ref Range   Squamous Epithelial / LPF 0-5 (A) NONE SEEN   WBC, UA 6-30 0 - 5 WBC/hpf   RBC / HPF NONE SEEN 0 - 5 RBC/hpf   Bacteria, UA FEW (A) NONE SEEN    MAU Course: - IVFs, famotidine and zofran given. Pt's symptoms improved.  MDM: Plan of care reviewed with patient,  including labs and tests ordered and medical treatment.   Assessment: 1. Gastroenteritis   2. Third trimester pregnancy   3. NST (non-stress test) reactive     Plan: - Rx for zofran - Counseled on drinking small amount of fluids and keeping herself hydrated.  - Discharge home in stable condition.  - Labor precautions and fetal kick counts      Medication List    TAKE these medications   ondansetron 4 MG tablet Commonly known as:  ZOFRAN Take 1 tablet (4 mg total) by mouth every 8 (eight) hours as needed for nausea or vomiting.       Julie PearJulie P Degele, MD 10/24/2015   Midwife attestation:  I have seen and examined this patient; I agree with above documentation in the resident's note.   Julie Cline is a 20 y.o. G1P0000 reporting N/V +FM, denies LOF, VB, contractions, vaginal discharge.  PE: BP 118/71 (BP Location: Right Arm)   Pulse 74   Temp 98.9 F (37.2 C) (Oral)   Resp 16   Wt 90.4 kg (199 lb 4 oz)   LMP 01/18/2015 (Exact Date)   BMI 33.16 kg/m  Gen: calm comfortable, NAD Resp: normal effort, no distress Abd: gravid  ROS, labs, PMH reviewed NST reactive  A/P: 39.[redacted] weeks gestation Gastroenteritis Reactive NST Follow up in office as scheduled Zofran prn  Donette LarryMelanie Verina Galeno, CNM  7:36 PM

## 2015-10-26 ENCOUNTER — Ambulatory Visit (INDEPENDENT_AMBULATORY_CARE_PROVIDER_SITE_OTHER): Payer: Medicaid Other | Admitting: Obstetrics & Gynecology

## 2015-10-26 VITALS — BP 118/68 | HR 61 | Wt 194.6 lb

## 2015-10-26 DIAGNOSIS — O480 Post-term pregnancy: Secondary | ICD-10-CM

## 2015-10-26 DIAGNOSIS — O0993 Supervision of high risk pregnancy, unspecified, third trimester: Secondary | ICD-10-CM

## 2015-10-26 MED ORDER — PANTOPRAZOLE SODIUM 20 MG PO TBEC
20.0000 mg | DELAYED_RELEASE_TABLET | Freq: Every day | ORAL | 0 refills | Status: DC
Start: 2015-10-26 — End: 2015-11-01

## 2015-10-26 MED ORDER — METOCLOPRAMIDE HCL 5 MG PO TABS: 5.0000 mg | ORAL_TABLET | Freq: Three times a day (TID) | ORAL | 1 refills | Status: DC

## 2015-10-26 NOTE — Progress Notes (Signed)
Pt has been having nausea and vomiting x3 days - was seen @ MAU on 9/26 and given Rx for Zofran which she has not obtained from pharmacy yet. She is able to tolerate sips of liquid and only small amount of food. No heartburn    PRENATAL VISIT NOTE  Subjective:  Julie Cline is a 20 y.o. G1P0000 at 651w1d being seen today for ongoing prenatal care.  She is currently monitored for the following issues for this low-risk pregnancy and has Placental abruption; Supervision of high risk pregnancy, antepartum; and Vaginal bleeding in pregnancy on her problem list.  Patient reports nausea, occasional contractions and vomiting.  Contractions: Not present. Vag. Bleeding: None.  Movement: Present. Denies leaking of fluid.   The following portions of the patient's history were reviewed and updated as appropriate: allergies, current medications, past family history, past medical history, past social history, past surgical history and problem list. Problem list updated.  Objective:   Vitals:   10/26/15 1544  BP: 118/68  Pulse: 61  Weight: 194 lb 9.6 oz (88.3 kg)    Fetal Status: Fetal Heart Rate (bpm): NST   Movement: Present     General:  Alert, oriented and cooperative. Patient is in no acute distress.  Skin: Skin is warm and dry. No rash noted.   Cardiovascular: Normal heart rate noted  Respiratory: Normal respiratory effort, no problems with respiration noted  Abdomen: Soft, gravid, appropriate for gestational age. Pain/Pressure: Present     Pelvic:  Cervical exam performed        Extremities: Normal range of motion.     Mental Status: Normal mood and affect. Normal behavior. Normal judgment and thought content.   Urinalysis:      Assessment and Plan:  Pregnancy: G1P0000 at 421w1d  1. Post term pregnancy, antepartum condition or complication NST reactive - Fetal nonstress test - metoCLOPramide (REGLAN) 5 MG tablet; Take 1 tablet (5 mg total) by mouth 4 (four) times daily -  before  meals and at bedtime.  Dispense: 30 tablet; Refill: 1 - pantoprazole (PROTONIX) 20 MG tablet; Take 1 tablet (20 mg total) by mouth daily.  Dispense: 20 tablet; Refill: 0  2. Supervision of high risk pregnancy, antepartum, third trimester N&V may be due to reflux  Term labor symptoms and general obstetric precautions including but not limited to vaginal bleeding, contractions, leaking of fluid and fetal movement were reviewed in detail with the patient. Please refer to After Visit Summary for other counseling recommendations.  Return in about 4 days (around 10/30/2015) for NST/AFI.  Adam PhenixJames G Arnold, MD

## 2015-10-26 NOTE — Progress Notes (Signed)
Pt has been having nausea and vomiting x3 days - was seen @ MAU on 9/26 and given Rx for Zofran which she has not obtained from pharmacy yet. She is able to tolerate sips of liquid but no food.

## 2015-10-26 NOTE — Patient Instructions (Signed)
Vaginal Delivery °During delivery, your health care provider will help you give birth to your baby. During a vaginal delivery, you will work to push the baby out of your vagina. However, before you can push your baby out, a few things need to happen. The opening of your uterus (cervix) has to soften, thin out, and open up (dilate) all the way to 10 cm. Also, your baby has to move down from the uterus into your vagina.  °SIGNS OF LABOR  °Your health care provider will first need to make sure you are in labor. Signs of labor include:  °· Passing what is called the mucous plug before labor begins. This is a small amount of blood-stained mucus. °· Having regular, painful uterine contractions.   °· The time between contractions gets shorter.   °· The discomfort and pain gradually get more intense. °· Contraction pains get worse when walking and do not go away when resting.   °· Your cervix becomes thinner (effacement) and dilates. °BEFORE THE DELIVERY °Once you are in labor and admitted into the hospital or care center, your health care provider may do the following:  °· Perform a complete physical exam. °· Review any complications related to pregnancy or labor.  °· Check your blood pressure, pulse, temperature, and heart rate (vital signs).   °· Determine if, and when, the rupture of amniotic membranes occurred. °· Do a vaginal exam (using a sterile glove and lubricant) to determine:   °¨ The position (presentation) of the baby. Is the baby's head presenting first (vertex) in the birth canal (vagina), or are the feet or buttocks first (breech)?   °¨ The level (station) of the baby's head within the birth canal.   °¨ The effacement and dilatation of the cervix.   °· An electronic fetal monitor is usually placed on your abdomen when you first arrive. This is used to monitor your contractions and the baby's heart rate. °¨ When the monitor is on your abdomen (external fetal monitor), it can only pick up the frequency and  length of your contractions. It cannot tell the strength of your contractions. °¨ If it becomes necessary for your health care provider to know exactly how strong your contractions are or to see exactly what the baby's heart rate is doing, an internal monitor may be inserted into your vagina and uterus. Your health care provider will discuss the benefits and risks of using an internal monitor and obtain your permission before inserting the device. °¨ Continuous fetal monitoring may be needed if you have an epidural, are receiving certain medicines (such as oxytocin), or have pregnancy or labor complications. °· An IV access tube may be placed into a vein in your arm to deliver fluids and medicines if necessary. °THREE STAGES OF LABOR AND DELIVERY °Normal labor and delivery is divided into three stages. °First Stage °This stage starts when you begin to contract regularly and your cervix begins to efface and dilate. It ends when your cervix is completely open (fully dilated). The first stage is the longest stage of labor and can last from 3 hours to 15 hours.  °Several methods are available to help with labor pain. You and your health care provider will decide which option is best for you. Options include:  °· Opioid medicines. These are strong pain medicines that you can get through your IV tube or as a shot into your muscle. These medicines lessen pain but do not make it go away completely.  °· Epidural. A medicine is given through a thin tube that   is inserted in your back. The medicine numbs the lower part of your body and prevents any pain in that area. °· Paracervical pain medicine. This is an injection of an anesthetic on each side of your cervix.   °· You may request natural childbirth, which does not involve the use of pain medicines or an epidural during labor and delivery. Instead, you will use other things, such as breathing exercises, to help cope with the pain. °Second Stage °The second stage of labor  begins when your cervix is fully dilated at 10 cm. It continues until you push your baby down through the birth canal and the baby is born. This stage can take only minutes or several hours. °· The location of your baby's head as it moves through the birth canal is reported as a number called a station. If the baby's head has not started its descent, the station is described as being at minus 3 (-3). When your baby's head is at the zero station, it is at the middle of the birth canal and is engaged in the pelvis. The station of your baby helps indicate the progress of the second stage of labor. °· When your baby is born, your health care provider may hold the baby with his or her head lowered to prevent amniotic fluid, mucus, and blood from getting into the baby's lungs. The baby's mouth and nose may be suctioned with a small bulb syringe to remove any additional fluid. °· Your health care provider may then place the baby on your stomach. It is important to keep the baby from getting cold. To do this, the health care provider will dry the baby off, place the baby directly on your skin (with no blankets between you and the baby), and cover the baby with warm, dry blankets.   °· The umbilical cord is cut. °Third Stage °During the third stage of labor, your health care provider will deliver the placenta (afterbirth) and make sure your bleeding is under control. The delivery of the placenta usually takes about 5 minutes but can take up to 30 minutes. After the placenta is delivered, a medicine may be given either by IV or injection to help contract the uterus and control bleeding. If you are planning to breastfeed, you can try to do so now. °After you deliver the placenta, your uterus should contract and get very firm. If your uterus does not remain firm, your health care provider will massage it. This is important because the contraction of the uterus helps cut off bleeding at the site where the placenta was attached  to your uterus. If your uterus does not contract properly and stay firm, you may continue to bleed heavily. If there is a lot of bleeding, medicines may be given to contract the uterus and stop the bleeding.  °  °This information is not intended to replace advice given to you by your health care provider. Make sure you discuss any questions you have with your health care provider. °  °Document Released: 10/24/2007 Document Revised: 02/04/2014 Document Reviewed: 09/11/2011 °Elsevier Interactive Patient Education ©2016 [employer] ° °

## 2015-10-30 ENCOUNTER — Encounter (HOSPITAL_COMMUNITY): Payer: Self-pay | Admitting: *Deleted

## 2015-10-30 ENCOUNTER — Ambulatory Visit (INDEPENDENT_AMBULATORY_CARE_PROVIDER_SITE_OTHER): Payer: Medicaid Other | Admitting: Family Medicine

## 2015-10-30 ENCOUNTER — Telehealth (HOSPITAL_COMMUNITY): Payer: Self-pay | Admitting: *Deleted

## 2015-10-30 DIAGNOSIS — O480 Post-term pregnancy: Secondary | ICD-10-CM

## 2015-10-30 NOTE — Progress Notes (Signed)
NST reactive.

## 2015-10-30 NOTE — Addendum Note (Signed)
Addended by: Jill SideAY, DIANE L on: 10/30/2015 03:46 PM   Modules accepted: Orders

## 2015-10-30 NOTE — Telephone Encounter (Signed)
Preadmission screen  

## 2015-11-01 ENCOUNTER — Inpatient Hospital Stay (HOSPITAL_COMMUNITY): Payer: Medicaid Other | Admitting: Anesthesiology

## 2015-11-01 ENCOUNTER — Encounter (HOSPITAL_COMMUNITY): Payer: Self-pay

## 2015-11-01 ENCOUNTER — Inpatient Hospital Stay (HOSPITAL_COMMUNITY)
Admission: RE | Admit: 2015-11-01 | Discharge: 2015-11-02 | DRG: 775 | Disposition: A | Discharge status: Home / Self Care | Payer: Medicaid Other | Source: Ambulatory Visit | Attending: Obstetrics & Gynecology | Admitting: Obstetrics & Gynecology

## 2015-11-01 DIAGNOSIS — O480 Post-term pregnancy: Secondary | ICD-10-CM | POA: Diagnosis present

## 2015-11-01 DIAGNOSIS — Z833 Family history of diabetes mellitus: Secondary | ICD-10-CM

## 2015-11-01 DIAGNOSIS — Z3A41 41 weeks gestation of pregnancy: Secondary | ICD-10-CM

## 2015-11-01 DIAGNOSIS — Z87891 Personal history of nicotine dependence: Secondary | ICD-10-CM

## 2015-11-01 LAB — CBC
HCT: 32.6 % — ABNORMAL LOW (ref 36.0–46.0)
HEMOGLOBIN: 11 g/dL — AB (ref 12.0–15.0)
MCH: 28.9 pg (ref 26.0–34.0)
MCHC: 33.7 g/dL (ref 30.0–36.0)
MCV: 85.6 fL (ref 78.0–100.0)
Platelets: 250 10*3/uL (ref 150–400)
RBC: 3.81 MIL/uL — ABNORMAL LOW (ref 3.87–5.11)
RDW: 14 % (ref 11.5–15.5)
WBC: 8.1 10*3/uL (ref 4.0–10.5)

## 2015-11-01 LAB — RPR: RPR Ser Ql: NONREACTIVE

## 2015-11-01 LAB — TYPE AND SCREEN
ABO/RH(D): A POS
Antibody Screen: NEGATIVE

## 2015-11-01 MED ORDER — TERBUTALINE SULFATE 1 MG/ML IJ SOLN
0.2500 mg | Freq: Once | INTRAMUSCULAR | Status: DC | PRN
  Filled 2015-11-01: qty 1

## 2015-11-01 MED ORDER — BENZOCAINE-MENTHOL 20-0.5 % EX AERO: 1.0000 "application " | INHALATION_SPRAY | CUTANEOUS | Status: DC | PRN

## 2015-11-01 MED ORDER — LACTATED RINGERS IV SOLN
INTRAVENOUS | Status: DC
  Administered 2015-11-01 (×3): via INTRAVENOUS

## 2015-11-01 MED ORDER — DIBUCAINE 1 % RE OINT: 1.0000 "application " | TOPICAL_OINTMENT | RECTAL | Status: DC | PRN

## 2015-11-01 MED ORDER — WITCH HAZEL-GLYCERIN EX PADS: 1.0000 "application " | MEDICATED_PAD | CUTANEOUS | Status: DC | PRN

## 2015-11-01 MED ORDER — PRENATAL MULTIVITAMIN CH
1.0000 | ORAL_TABLET | Freq: Every day | ORAL | Status: DC
  Administered 2015-11-02: 1 via ORAL
  Filled 2015-11-01: qty 1

## 2015-11-01 MED ORDER — OXYTOCIN 40 UNITS IN LACTATED RINGERS INFUSION - SIMPLE MED: 2.5000 [IU]/h | INTRAVENOUS | Status: DC

## 2015-11-01 MED ORDER — LIDOCAINE HCL (PF) 1 % IJ SOLN
INTRAMUSCULAR | Status: DC | PRN
  Administered 2015-11-01 (×2): 4 mL

## 2015-11-01 MED ORDER — ONDANSETRON HCL 4 MG PO TABS: 4.0000 mg | ORAL_TABLET | ORAL | Status: DC | PRN

## 2015-11-01 MED ORDER — FENTANYL 2.5 MCG/ML BUPIVACAINE 1/10 % EPIDURAL INFUSION (WH - ANES)
14.0000 mL/h | INTRAMUSCULAR | Status: DC | PRN
  Administered 2015-11-01 (×3): 14 mL/h via EPIDURAL
  Filled 2015-11-01 (×2): qty 125

## 2015-11-01 MED ORDER — EPHEDRINE 5 MG/ML INJ
10.0000 mg | INTRAVENOUS | Status: DC | PRN
  Filled 2015-11-01: qty 4

## 2015-11-01 MED ORDER — LACTATED RINGERS IV SOLN: 500.0000 mL | INTRAVENOUS | Status: DC | PRN

## 2015-11-01 MED ORDER — SOD CITRATE-CITRIC ACID 500-334 MG/5ML PO SOLN: 30.0000 mL | ORAL | Status: DC | PRN

## 2015-11-01 MED ORDER — ONDANSETRON HCL 4 MG/2ML IJ SOLN
4.0000 mg | Freq: Four times a day (QID) | INTRAMUSCULAR | Status: DC | PRN
  Administered 2015-11-01: 4 mg via INTRAVENOUS
  Filled 2015-11-01 (×2): qty 2

## 2015-11-01 MED ORDER — OXYCODONE-ACETAMINOPHEN 5-325 MG PO TABS: 2.0000 | ORAL_TABLET | ORAL | Status: DC | PRN

## 2015-11-01 MED ORDER — FENTANYL 2.5 MCG/ML BUPIVACAINE 1/10 % EPIDURAL INFUSION (WH - ANES): 14.0000 mL/h | INTRAMUSCULAR | Status: DC | PRN

## 2015-11-01 MED ORDER — LACTATED RINGERS IV SOLN: 500.0000 mL | Freq: Once | INTRAVENOUS | Status: DC

## 2015-11-01 MED ORDER — OXYCODONE-ACETAMINOPHEN 5-325 MG PO TABS: 1.0000 | ORAL_TABLET | ORAL | Status: DC | PRN

## 2015-11-01 MED ORDER — ACETAMINOPHEN 325 MG PO TABS
650.0000 mg | ORAL_TABLET | ORAL | Status: DC | PRN
  Administered 2015-11-02: 650 mg via ORAL
  Filled 2015-11-01: qty 2

## 2015-11-01 MED ORDER — MEASLES, MUMPS & RUBELLA VAC ~~LOC~~ INJ
0.5000 mL | INJECTION | Freq: Once | SUBCUTANEOUS | Status: DC
  Filled 2015-11-01: qty 0.5

## 2015-11-01 MED ORDER — SENNOSIDES-DOCUSATE SODIUM 8.6-50 MG PO TABS
2.0000 | ORAL_TABLET | ORAL | Status: DC
  Administered 2015-11-01: 2 via ORAL
  Filled 2015-11-01: qty 2

## 2015-11-01 MED ORDER — PHENYLEPHRINE 40 MCG/ML (10ML) SYRINGE FOR IV PUSH (FOR BLOOD PRESSURE SUPPORT)
80.0000 ug | PREFILLED_SYRINGE | INTRAVENOUS | Status: DC | PRN
  Filled 2015-11-01: qty 5
  Filled 2015-11-01: qty 10

## 2015-11-01 MED ORDER — ACETAMINOPHEN 325 MG PO TABS
650.0000 mg | ORAL_TABLET | ORAL | Status: DC | PRN
  Administered 2015-11-01: 650 mg via ORAL
  Filled 2015-11-01: qty 2

## 2015-11-01 MED ORDER — ZOLPIDEM TARTRATE 5 MG PO TABS: 5.0000 mg | ORAL_TABLET | Freq: Every evening | ORAL | Status: DC | PRN

## 2015-11-01 MED ORDER — COCONUT OIL OIL: 1.0000 "application " | TOPICAL_OIL | Status: DC | PRN

## 2015-11-01 MED ORDER — IBUPROFEN 600 MG PO TABS
600.0000 mg | ORAL_TABLET | Freq: Four times a day (QID) | ORAL | Status: DC
  Administered 2015-11-01 – 2015-11-02 (×3): 600 mg via ORAL
  Filled 2015-11-01 (×3): qty 1

## 2015-11-01 MED ORDER — FENTANYL CITRATE (PF) 100 MCG/2ML IJ SOLN
100.0000 ug | INTRAMUSCULAR | Status: DC | PRN
  Administered 2015-11-01 (×2): 100 ug via INTRAVENOUS
  Filled 2015-11-01 (×2): qty 2

## 2015-11-01 MED ORDER — LIDOCAINE HCL (PF) 1 % IJ SOLN
30.0000 mL | INTRAMUSCULAR | Status: DC | PRN
  Administered 2015-11-01: 30 mL via SUBCUTANEOUS
  Filled 2015-11-01: qty 30

## 2015-11-01 MED ORDER — OXYTOCIN BOLUS FROM INFUSION
500.0000 mL | Freq: Once | INTRAVENOUS | Status: AC
  Administered 2015-11-01: 500 mL via INTRAVENOUS

## 2015-11-01 MED ORDER — TETANUS-DIPHTH-ACELL PERTUSSIS 5-2.5-18.5 LF-MCG/0.5 IM SUSP
0.5000 mL | Freq: Once | INTRAMUSCULAR | Status: DC
Start: 2015-11-02 — End: 2015-11-02

## 2015-11-01 MED ORDER — FLEET ENEMA 7-19 GM/118ML RE ENEM: 1.0000 | ENEMA | RECTAL | Status: DC | PRN

## 2015-11-01 MED ORDER — ONDANSETRON HCL 4 MG/2ML IJ SOLN: 4.0000 mg | INTRAMUSCULAR | Status: DC | PRN

## 2015-11-01 MED ORDER — PHENYLEPHRINE 40 MCG/ML (10ML) SYRINGE FOR IV PUSH (FOR BLOOD PRESSURE SUPPORT)
80.0000 ug | PREFILLED_SYRINGE | INTRAVENOUS | Status: DC | PRN
  Filled 2015-11-01: qty 5

## 2015-11-01 MED ORDER — OXYTOCIN 40 UNITS IN LACTATED RINGERS INFUSION - SIMPLE MED
1.0000 m[IU]/min | INTRAVENOUS | Status: DC
  Administered 2015-11-01: 2 m[IU]/min via INTRAVENOUS
  Filled 2015-11-01: qty 1000

## 2015-11-01 MED ORDER — DIPHENHYDRAMINE HCL 25 MG PO CAPS: 25.0000 mg | ORAL_CAPSULE | Freq: Four times a day (QID) | ORAL | Status: DC | PRN

## 2015-11-01 MED ORDER — MISOPROSTOL 25 MCG QUARTER TABLET
25.0000 ug | ORAL_TABLET | ORAL | Status: DC
  Administered 2015-11-01: 25 ug via VAGINAL
  Filled 2015-11-01: qty 1
  Filled 2015-11-01: qty 0.25
  Filled 2015-11-01 (×4): qty 1

## 2015-11-01 MED ORDER — SIMETHICONE 80 MG PO CHEW: 80.0000 mg | CHEWABLE_TABLET | ORAL | Status: DC | PRN

## 2015-11-01 MED ORDER — DIPHENHYDRAMINE HCL 50 MG/ML IJ SOLN: 12.5000 mg | INTRAMUSCULAR | Status: DC | PRN

## 2015-11-01 NOTE — Progress Notes (Signed)
  LABOR PROGRESS NOTE  Julie Cline is a 20 y.o. G1P0000 at 4459w0d Doing well, comfortable with epidural IUPC placed for better monitoring Dilation: 5.5 Effacement (%): 50 Cervical Position: Middle Station: -1 Presentation: Vertex Exam by:: Ashok PallWouk, MD  Anticipate vaginal delivery  Tillman SersAngela C Riccio, DO PGY-1 10/4/20171:44 PM

## 2015-11-01 NOTE — Progress Notes (Signed)
Patient ID: Julie Cline, female   DOB: 08/30/1995, 20 y.o.   MRN: [id number]  S: Called to patient's room due to repetitive variables. Patient is comfortable, feeling strong contractions every so often, sitting at edge of bed.  O:  Vitals:   11/01/15 0209 11/01/15 0602  BP: 136/78 130/78  Pulse: (!) 51 (!) 59  Temp: 98.3 F (36.8 C) 97.9 F (36.6 C)    Dilation: 1.5 Effacement (%): 60 Station: -3 Presentation: Vertex Exam by:: Dr. Omer JackMumaw  FHT: 130, mod var, no accels, a couple repetitive variable decels lasting 1 min, nadir to 70-90s with recovery to baseline. Variables improved while in room with patient sitting at bedside. TOCO: Irregular  A/P: Continue positional changes Received IVF bolus Foley bulb placed Will reassess before next cytotec

## 2015-11-01 NOTE — Anesthesia Procedure Notes (Addendum)
Epidural Patient location during procedure: OB  Staffing Anesthesiologist: Lewie LoronGERMEROTH, Emmry Hinsch Performed: anesthesiologist   Preanesthetic Checklist Completed: patient identified, pre-op evaluation, timeout performed, IV checked, risks and benefits discussed and monitors and equipment checked  Epidural Patient position: sitting Prep: site prepped and draped and DuraPrep Patient monitoring: heart rate Approach: midline Location: L2-L3 Injection technique: LOR air and LOR saline  Needle:  Needle type: Tuohy  Needle gauge: 17 G Needle length: 9 cm Needle insertion depth: 7 cm Catheter type: closed end flexible Catheter size: 19 Gauge Catheter at skin depth: 13 cm Test dose: negative  Assessment Sensory level: T8 Events: blood not aspirated, injection not painful, no injection resistance, negative IV test and no paresthesia  Additional Notes Reason for block:procedure for pain

## 2015-11-01 NOTE — Lactation Note (Signed)
This note was copied from a baby's chart. Lactation Consultation Note  Patient Name: Julie Cline: 11/01/2015 Reason for consult: Initial assessment   [facility] Infant was STS with mom and cueing to feed. Mom came in planning to bottle feed, family in room and encouraging her to breast feed. I was asked to see her by bedside RN. Reviewed benefits of BF and risks of formula and mom agreed to BF infant. Room was very busy with visitors and phone calls, mom will need teaching reiterated.   Infant was noted to be tongue sucking and was initially difficult to to get infant to open wide to latch. After a few minutes he opened wide and latched. He was noted to have flanged lips, rhythmic sucking and intermittent swallows. Mom with soft compressible breasts and areola, with everted nipples. Colostrum was easily expressible from both breasts. Enc mom to compress/ massage breast with feedings. Infant came off breast after 15 minutes and was weighed and measured. Infant weighed 6 lb 3.3 oz. He was then relatched to left breast with flanged lips, rhythmic sucking and intermittent swallows. Mom denied pain with feeding.   Advised mom to feed infant 8-12 x in 24 hours at first feeding cues. Enc mom to not allow infant to go > 3 hours between feeds. Discussed the normalcy of cluster feeding. Discussed with mom the importance of deep latch and pillow and head support with feedings.   BF Resources Handout, Feeding Log and LC Brochure given, informed of IP/OP Services, BF Support Groups and LC phone #. Enc mom to call out to desk for feeding assistance as needed . Follow up tomorrow and prn.     Maternal Data Formula Feeding for Exclusion: No Does the patient have breastfeeding experience prior to this delivery?: No  Feeding Feeding Type: Breast Fed Length of feed: 20 min  LATCH Score/Interventions Latch: Grasps breast  easily, tongue down, lips flanged, rhythmical sucking.  Audible Swallowing: Spontaneous and intermittent  Type of Nipple: Everted at rest and after stimulation  Comfort (Breast/Nipple): Soft / non-tender     Hold (Positioning): Assistance needed to correctly position infant at breast and maintain latch.  LATCH Score: 9  Lactation Tools Discussed/Used WIC Program: No (Sister had a PIS per mom )   Consult Status Consult Status: Follow-up Cline: 11/02/15 Follow-up type: In-patient    Silas FloodSharon S Ornella Coderre 11/01/2015, 6:05 PM

## 2015-11-01 NOTE — H&P (Signed)
LABOR AND DELIVERY ADMISSION HISTORY AND PHYSICAL NOTE  Julie Cline is a 20 y.o. female G1P0000 with IUP at [redacted]w[redacted]d by LMP and Hx of placental abruption that has resolved presenting for IOL for post dates. Endorses sporadic contractions that started about 1 month ago. Have not changed in that time.  She reports positive fetal movement. She denies leakage of fluid or vaginal bleeding.  Prenatal History/Complications:  Past Medical History: Past Medical History:  Diagnosis Date  . Chlamydia   . Medical history non-contributory   . Urinary tract infection     Past Surgical History: Past Surgical History:  Procedure Laterality Date  . EYE SURGERY     Eye lid  . HERNIA REPAIR      Obstetrical History: OB History    Gravida Para Term Preterm AB Living   1 0 0 0 0 0   SAB TAB Ectopic Multiple Live Births   0 0 0 0        Social History: Social History   Social History  . Marital status: Single    Spouse name: N/A  . Number of children: N/A  . Years of education: N/A   Social History Main Topics  . Smoking status: Former Smoker    Packs/day: 0.25    Years: 6.00    Types: Cigarettes    Quit date: 01/27/2013  . Smokeless tobacco: Never Used  . Alcohol use No     Comment: Pt says she drank prior to pregnancy  . Drug use: No  . Sexual activity: Not Currently    Birth control/ protection: None   Other Topics Concern  . Not on file   Social History Narrative  . No narrative on file    Family History: Family History  Problem Relation Age of Onset  . Diabetes Maternal Uncle   . Diabetes Paternal Grandfather   . Alcohol abuse Neg Hx   . Arthritis Neg Hx   . Asthma Neg Hx   . Birth defects Neg Hx   . Cancer Neg Hx   . COPD Neg Hx   . Depression Neg Hx   . Drug abuse Neg Hx   . Early death Neg Hx   . Hearing loss Neg Hx   . Heart disease Neg Hx   . Hyperlipidemia Neg Hx   . Hypertension Neg Hx   . Kidney disease Neg Hx   . Learning disabilities Neg Hx    . Mental illness Neg Hx   . Mental retardation Neg Hx   . Miscarriages / Stillbirths Neg Hx   . Stroke Neg Hx   . Vision loss Neg Hx   . Varicose Veins Neg Hx     Allergies: No Known Allergies  Prescriptions Prior to Admission  Medication Sig Dispense Refill Last Dose  . metoCLOPramide (REGLAN) 5 MG tablet Take 1 tablet (5 mg total) by mouth 4 (four) times daily -  before meals and at bedtime. 30 tablet 1   . ondansetron (ZOFRAN) 4 MG tablet Take 1 tablet (4 mg total) by mouth every 8 (eight) hours as needed for nausea or vomiting. (Patient not taking: Reported on 10/26/2015) 20 tablet 0 Not Taking  . pantoprazole (PROTONIX) 20 MG tablet Take 1 tablet (20 mg total) by mouth daily. 20 tablet 0      Review of Systems   All systems reviewed and negative except as stated in HPI  Blood pressure 136/78, pulse (!) 51, temperature 98.3 F (36.8 C), temperature source  Oral, height 5\' 5"  (1.651 m), weight 88.9 kg (196 lb), last menstrual period 01/18/2015. General appearance: alert, cooperative and no distress Lungs: no respiratory distress Heart: regular rate Abdomen: soft, non-tender Extremities: No calf swelling or tenderness Presentation: cephalic by US Fetal monitoring: baseline 140, moderate variability Uterine activity: sporadic      Prenatal labs: ABO, Rh: --/--/A POS (10/04 0215) Antibody: NEG (10/04 0215) Rubella: immune RPR: NON REAC (07/07 0939)  HBsAg: Negative (06/08 1316)  HIV: NONREACTIVE (07/07 0939)  GBS: Negative (09/19 0000)  Genetic screening:  Not done due to late presentation  Anatomy US: normal  Prenatal Transfer Tool  Maternal Diabetes: No Genetic Screening: Declined, presented to prenatal care too late for it to be done Maternal Ultrasounds/Referrals: Normal Fetal Ultrasounds or other Referrals:  None Maternal Substance Abuse:  No Significant Maternal Medications:  None Significant Maternal Lab Results: Lab values include: Group B Strep  negative  Results for orders placed or performed during the hospital encounter of 11/01/15 (from the past 24 hour(s))  CBC   Collection Time: 11/01/15  2:15 AM  Result Value Ref Range   WBC 8.1 4.0 - 10.5 K/uL   RBC 3.81 (L) 3.87 - 5.11 MIL/uL   Hemoglobin 11.0 (L) 12.0 - 15.0 g/dL   HCT 16.132.6 (L) 09.636.0 - 04.546.0 %   MCV 85.6 78.0 - 100.0 fL   MCH 28.9 26.0 - 34.0 pg   MCHC 33.7 30.0 - 36.0 g/dL   RDW 40.914.0 81.111.5 - 91.415.5 %   Platelets 250 150 - 400 K/uL  Type and screen [facility]   Collection Time: 11/01/15  2:15 AM  Result Value Ref Range   ABO/RH(D) A POS    Antibody Screen NEG    Sample Expiration 11/04/2015     Patient Active Problem List   Diagnosis Date Noted  . Supervision of high risk pregnancy, antepartum 07/08/2015  . Vaginal bleeding in pregnancy   . Placental abruption 07/06/2015    Assessment: Julie Cline is a 20 y.o. G1P0000 at 1749w0d here for IOL for post dates  #labor: start Cytotec, foley bulb if cervix favorable  #Pain: Plan for epidural #FWB: Category 1 strip #ID: GBS negative #MOF: bottle #MOC: unsure #Circ:  Yes, unsure if going to do inpatient or outpatient  Harvin Hazelhristina Rizk, Medical Student 10/4/20173:35 AM   OB FELLOW MEDICAL STUDENT NOTE ATTESTATION  I have seen and examined this patient. Note this is a Psychologist, occupationalmedical student note and as such does not necessarily reflect the patient's plan of care. Please see H&P note for this date of service.    Jen MowElizabeth Mumaw, DO [place] Fellow 11/01/2015

## 2015-11-01 NOTE — Anesthesia Pain Management Evaluation Note (Signed)
  CRNA Pain Management Visit Note  Patient: Julie Cline, 20 y.o., female  "Hello I am a [facility]. We have an anesthesia team available at all times to provide care throughout the hospital, including epidural management and anesthesia for C-section. I don't know your plan for the delivery whether it a natural birth, water birth, IV sedation, nitrous supplementation, doula or epidural, but we want to meet your pain goals."   1.Was your pain managed to your expectations on prior hospitalizations?   No prior hospitalizations  2.What is your expectation for pain management during this hospitalization?     Epidural and IV pain meds  3.How can we help you reach that goal? IV pain meds, epidural when ready.  Record the patient's initial score and the patient's pain goal.   Pain: 9--L&D RN at bedside and preparing to administer IV pain meds  Pain Goal: 0--reasonable expectations discussed [facility] wants you to be able to say your pain was always managed very well.  Clois Montavon L 11/01/2015

## 2015-11-01 NOTE — Anesthesia Rounding Note (Signed)
  CRNA Epidural Rounding Note  Patient: Julie Cline, 20 y.o., female  Patient's current pain level: Pain Score: 6  (11/01/15 1539)  Agreed upon pain management level: 4  Epidural intervention: No   Comments:   Julie Cline 11/01/2015

## 2015-11-01 NOTE — Anesthesia Preprocedure Evaluation (Signed)
Anesthesia Evaluation  Patient identified by MRN, date of birth, ID band Patient awake    Reviewed: Allergy & Precautions, NPO status , Patient's Chart, lab work & pertinent test results  Airway Mallampati: II  TM Distance: >3 FB Neck ROM: Full    Dental no notable dental hx.    Pulmonary neg pulmonary ROS, former smoker,    Pulmonary exam normal breath sounds clear to auscultation       Cardiovascular negative cardio ROS Normal cardiovascular exam Rhythm:Regular Rate:Normal     Neuro/Psych negative neurological ROS  negative psych ROS   GI/Hepatic negative GI ROS, Neg liver ROS,   Endo/Other  negative endocrine ROS  Renal/GU negative Renal ROS     Musculoskeletal negative musculoskeletal ROS (+)   Abdominal (+) + obese,   Peds  Hematology negative hematology ROS (+)   Anesthesia Other Findings   Reproductive/Obstetrics (+) Pregnancy                             Anesthesia Physical Anesthesia Plan  ASA: II  Anesthesia Plan: Epidural   Post-op Pain Management:    Induction:   Airway Management Planned:   Additional Equipment:   Intra-op Plan:   Post-operative Plan:   Informed Consent: I have reviewed the patients History and Physical, chart, labs and discussed the procedure including the risks, benefits and alternatives for the proposed anesthesia with the patient or authorized representative who has indicated his/her understanding and acceptance.     Plan Discussed with:   Anesthesia Plan Comments:         Anesthesia Quick Evaluation

## 2015-11-02 ENCOUNTER — Ambulatory Visit: Payer: Self-pay

## 2015-11-02 MED ORDER — IBUPROFEN 600 MG PO TABS: 600.0000 mg | ORAL_TABLET | Freq: Four times a day (QID) | ORAL | 0 refills | Status: DC

## 2015-11-02 MED ORDER — SENNOSIDES-DOCUSATE SODIUM 8.6-50 MG PO TABS: 2.0000 | ORAL_TABLET | ORAL | 0 refills | Status: DC

## 2015-11-02 NOTE — Progress Notes (Signed)
UR chart review completed.  

## 2015-11-02 NOTE — Discharge Summary (Signed)
OB Discharge Summary     Patient Name: Julie Cline DOB: 1995/05/11 MRN: [id number]  Date of admission: 11/01/2015 Delivering MD: Shonna Chock BEDFORD   Date of discharge: 11/02/2015  Admitting diagnosis: INDUCTION Intrauterine pregnancy: [redacted]w[redacted]d     Secondary diagnosis:  Active Problems:   Post term pregnancy at [redacted] weeks gestation  Additional problems: none     Discharge diagnosis: Term Pregnancy Delivered                                                                                                Post partum procedures:none  Augmentation: Cytotec and Foley Balloon  Complications: None  Hospital course:  Induction of Labor With Vaginal Delivery   20 y.o. yo G1P1001 at [redacted]w[redacted]d was admitted to the hospital 11/01/2015 for induction of labor.  Indication for induction: Postdates.  Patient had an uncomplicated labor course as follows: Membrane Rupture Time/Date: 12:04 PM ,11/01/2015   Intrapartum Procedures: Episiotomy: None [1]                                         Lacerations:  Labial [10];1st degree [2]  Patient had delivery of a Viable infant.  Information for the patient's newborn:  Julie, Cline [[id number]]  Delivery Method: Vaginal, Spontaneous Delivery (Filed from Delivery Summary)   11/01/2015  Details of delivery can be found in separate delivery note.  Patient had a routine postpartum course. Patient is discharged home 11/02/15.   Physical exam Vitals:   11/01/15 1822 11/01/15 1920 11/01/15 2322 11/02/15 0644  BP: 118/68 126/62 (!) 113/59 (!) 116/59  Pulse: 60 60 72 (!) 57  Resp:   18 17  Temp: 98.5 F (36.9 C) 98.7 F (37.1 C) 98.8 F (37.1 C) 98.9 F (37.2 C)  TempSrc: Oral Oral Oral Oral  SpO2:   100%   Weight:      Height:       General: alert, cooperative and no distress Lochia: appropriate Uterine Fundus: firm Incision: N/A DVT Evaluation: No evidence of DVT seen on physical exam. Labs: Lab Results  Component Value Date   WBC 8.1  11/01/2015   HGB 11.0 (L) 11/01/2015   HCT 32.6 (L) 11/01/2015   MCV 85.6 11/01/2015   PLT 250 11/01/2015   CMP Latest Ref Rng & Units 10/31/2011  Glucose 70 - 99 mg/dL 86  BUN 6 - 23 mg/dL 10  Creatinine 4.69 - 6.29 mg/dL 5.28  Sodium 413 - 244 mEq/L 140  Potassium 3.5 - 5.1 mEq/L 3.7  Chloride 96 - 112 mEq/L 103    Discharge instruction: per After Visit Summary and "Baby and Me Booklet".  After visit meds:    Medication List    TAKE these medications   ibuprofen 600 MG tablet Commonly known as:  ADVIL,MOTRIN Take 1 tablet (600 mg total) by mouth every 6 (six) hours.   senna-docusate 8.6-50 MG tablet Commonly known as:  Senokot-S Take 2 tablets by mouth daily. Start taking on:  11/03/2015  Diet: routine diet  Activity: Advance as tolerated. Pelvic rest for 6 weeks.   Outpatient follow up:6 weeks Follow up Appt:Future Appointments Date Time Provider Department Center  12/20/2015 10:20 AM Julie SkeensNicholas Michael Schenk, MD [facility]    Postpartum contraception: None  Newborn Data: Live born female  Birth Weight: 6 lb 3.3 oz (2815 g) APGAR: 8, 9  Baby Feeding: Bottle Disposition:home with mother   11/02/2015 Tillman SersAngela C Riccio, DO  I was present for the exam and agree with above. [facility] given. Undecided. Instructed to abstain x 6 weeks.   [place]orence[place]ia Shaneka Cline, CNM 11/02/2015 8:47 AM

## 2015-11-02 NOTE — Lactation Note (Signed)
This note was copied from a baby's chart. Lactation Consultation Note  Patient Name: Julie Cline Reason for consult: Follow-up assessment Baby at 28 hr of life. Mom stated bf is going well. She denies breast or nipple pain, voiced no concerns. Discussed baby behavior, feeding frequency, voids, wt loss, breast changes, and nipple care. She is aware of lactation services and support group. She will call as needed.    Maternal Data    Feeding Feeding Type: Breast Fed  LATCH Score/Interventions Latch: Grasps breast easily, tongue down, lips flanged, rhythmical sucking. Intervention(s): Skin to skin;Teach feeding cues;Waking techniques  Audible Swallowing: A few with stimulation Intervention(s): Skin to skin;Hand expression Intervention(s): Skin to skin;Hand expression  Type of Nipple: Everted at rest and after stimulation  Comfort (Breast/Nipple): Soft / non-tender     Hold (Positioning): Assistance needed to correctly position infant at breast and maintain latch. Intervention(s): Breastfeeding basics reviewed;Support Pillows;Position options;Skin to skin  LATCH Score: 8  Lactation Tools Discussed/Used     Consult Status Consult Status: Follow-up Date: 11/03/15 Follow-up type: In-patient    Julie Cline Cline, 8:43 PM

## 2015-11-02 NOTE — Anesthesia Postprocedure Evaluation (Signed)
Anesthesia Post Note  Patient: Donn Pierinishley L Mangine  Procedure(s) Performed: * No procedures listed *  Patient location during evaluation: Mother Baby Anesthesia Type: Epidural Level of consciousness: awake Pain management: pain level controlled Vital Signs Assessment: post-procedure vital signs reviewed and stable Respiratory status: spontaneous breathing Cardiovascular status: stable Postop Assessment: no headache, no backache, epidural receding and patient able to bend at knees Anesthetic complications: no     Last Vitals:  Vitals:   11/01/15 2322 11/02/15 0644  BP: (!) 113/59 (!) 116/59  Pulse: 72 (!) 57  Resp: 18 17  Temp: 37.1 C 37.2 C    Last Pain:  Vitals:   11/02/15 0644  TempSrc: Oral  PainSc:    Pain Goal: Patients Stated Pain Goal: 2 (11/01/15 1539)               Edison PaceWILKERSON,Evonne Rinks

## 2015-11-02 NOTE — Discharge Instructions (Signed)

## 2015-11-03 ENCOUNTER — Ambulatory Visit: Payer: Self-pay

## 2015-11-03 NOTE — Lactation Note (Signed)
This note was copied from a baby's chart. Lactation Consultation Note  Patient Name: Julie Cline ZOXWR'UToday's Date: 11/03/2015 Reason for consult: Follow-up assessment ( LC updated doc flow sheets per mom )  Baby 43 hours, baby has been breast feeding consistently , per mom took a break last  Night and did not feed. Per mom attempted x 2. Baby has not stooled since last night at 2130p.  Per mom has been gasey. Moms milk is coming in bilaterally. LC assessed breast tissue with  Moms permission - and noted breast to be full and nodules in the outer aspects of both breast.  ( breast just full not engorged ). Mom denies sore nipples . Sore nipple and engorgement prevention and tx  Reviewed. LC instructed mom on the use hand pump and noted the #24 Flange to be ok for today, #27  Provided for when the milk comes in. Also 2 colostrum containers and mom feels comfortable with hand expressing.  @ consult baby woke up , diaper dry, LC assisted mom with postioning and football position to obtain the  Depth. Baby has a tendency to suck upper lip and makes it difficult for mom to latch on baby by his self.  LC assisted to flip upper lip , multiple swallows noted and baby only stayed on for 5 mins and released.  Mom has a quick let down. Even though depth obtained , LC suspects due to quick let and the baby not stooling since last night  Is in the snacking mode.  Mother informed of post-discharge support and given phone number to the lactation department, including services for phone  call assistance; out-patient appointments; and breastfeeding support group. List of other breastfeeding resources in the community  given in the handout. Encouraged mother to call for problems or concerns related to breastfeeding.   Maternal Data Has patient been taught Hand Expression?: Yes  Feeding Feeding Type: Breast Fed Length of feed: 5 min  LATCH Score/Interventions Latch: Grasps breast easily, tongue down,  lips flanged, rhythmical sucking. Intervention(s): Skin to skin;Teach feeding cues;Waking techniques  Audible Swallowing: Spontaneous and intermittent  Type of Nipple: Everted at rest and after stimulation  Comfort (Breast/Nipple): Soft / non-tender     Hold (Positioning): Assistance needed to correctly position infant at breast and maintain latch. Intervention(s): Breastfeeding basics reviewed  LATCH Score: 9  Lactation Tools Discussed/Used Tools: Flanges;Pump Flange Size: 27 Breast pump type: Manual   Consult Status Consult Status: Complete Date: 11/03/15 Follow-up type: In-patient    Matilde SprangMargaret Ann Lucia Harm 11/03/2015, 12:22 PM

## 2015-12-20 ENCOUNTER — Ambulatory Visit: Payer: Medicaid Other | Admitting: Obstetrics and Gynecology

## 2016-01-30 ENCOUNTER — Encounter (HOSPITAL_COMMUNITY): Payer: Self-pay | Admitting: Emergency Medicine

## 2016-01-30 ENCOUNTER — Emergency Department (HOSPITAL_COMMUNITY)
Admission: EM | Admit: 2016-01-30 | Discharge: 2016-01-30 | Disposition: A | Discharge status: Home / Self Care | Payer: Medicaid Other | Attending: Emergency Medicine | Admitting: Emergency Medicine

## 2016-01-30 DIAGNOSIS — R51 Headache: Secondary | ICD-10-CM | POA: Diagnosis present

## 2016-01-30 DIAGNOSIS — Z87891 Personal history of nicotine dependence: Secondary | ICD-10-CM | POA: Insufficient documentation

## 2016-01-30 DIAGNOSIS — R519 Headache, unspecified: Secondary | ICD-10-CM

## 2016-01-30 MED ORDER — KETOROLAC TROMETHAMINE 60 MG/2ML IM SOLN
60.0000 mg | Freq: Once | INTRAMUSCULAR | Status: AC
  Administered 2016-01-30: 60 mg via INTRAMUSCULAR
  Filled 2016-01-30: qty 2

## 2016-01-30 MED ORDER — PROCHLORPERAZINE EDISYLATE 5 MG/ML IJ SOLN
10.0000 mg | Freq: Once | INTRAMUSCULAR | Status: AC
  Administered 2016-01-30: 10 mg via INTRAMUSCULAR
  Filled 2016-01-30: qty 2

## 2016-01-30 NOTE — Discharge Instructions (Signed)
Recommend excedrin migraine or similar if headache recurs. Follow-up with your primary care doctor. Return here for new concerns.

## 2016-01-30 NOTE — ED Triage Notes (Signed)
Pt c/o headache onset of 2 days ago that is worsening. Pt reports taking ibuprofen with no relief. Pt reports diarrhea last week but none recently. Pt has been able to tolerate food and fluids. Pt currently eating popcorn and drinking soda in triage area.

## 2016-01-30 NOTE — ED Provider Notes (Signed)
[facility]-EMERGENCY DEPT Provider Note    By signing my name below, I, Earmon Phoenix, attest that this documentation has been prepared under the direction and in the presence of Sharilyn Sites, PA-C. Electronically Signed: Earmon Phoenix, ED Scribe. 01/30/16. 3:54 PM    History   Chief Complaint Chief Complaint  Patient presents with  . Headache    The history is provided by the patient and medical records. No language interpreter was used.    HPI Comments:  HPI Comments:  Julie Cline is a 21 y.o. female who presents to the Emergency Department complaining of a worsening HA that began three days ago. She denies any associated symptoms. She describes the pain as pressure and throbbing. She has taken Motrin for pain with no significant relief. Pt reports sound increases the pain. She denies alleviating factors. She denies head injury or trauma. She denies nausea, vomiting, loss of vision.No numbness or weakness of her extremities. No difficulty walking. No confusion or changes in speech. She is not currently on anticoagulation.  No history of migraines.   Past Medical History:  Diagnosis Date  . Chlamydia   . Medical history non-contributory   . Urinary tract infection     Patient Active Problem List   Diagnosis Date Noted  . Post term pregnancy at [redacted] weeks gestation 11/01/2015  . Supervision of high risk pregnancy, antepartum 07/08/2015  . Vaginal bleeding in pregnancy   . Placental abruption 07/06/2015    Past Surgical History:  Procedure Laterality Date  . EYE SURGERY     Eye lid  . HERNIA REPAIR      OB History    Gravida Para Term Preterm AB Living   1 1 1  0 0 1   SAB TAB Ectopic Multiple Live Births   0 0 0 0 1       Home Medications    Prior to Admission medications   Medication Sig Start Date End Date Taking? Authorizing Provider  ibuprofen (ADVIL,MOTRIN) 600 MG tablet Take 1 tablet (600 mg total) by mouth every 6 (six) hours. 11/02/15   Tillman Sers, DO  senna-docusate (SENOKOT-S) 8.6-50 MG tablet Take 2 tablets by mouth daily. 11/03/15   Tillman Sers, DO    Family History Family History  Problem Relation Age of Onset  . Diabetes Maternal Uncle   . Diabetes Paternal Grandfather   . Alcohol abuse Neg Hx   . Arthritis Neg Hx   . Asthma Neg Hx   . Birth defects Neg Hx   . Cancer Neg Hx   . COPD Neg Hx   . Depression Neg Hx   . Drug abuse Neg Hx   . Early death Neg Hx   . Hearing loss Neg Hx   . Heart disease Neg Hx   . Hyperlipidemia Neg Hx   . Hypertension Neg Hx   . Kidney disease Neg Hx   . Learning disabilities Neg Hx   . Mental illness Neg Hx   . Mental retardation Neg Hx   . Miscarriages / Stillbirths Neg Hx   . Stroke Neg Hx   . Vision loss Neg Hx   . Varicose Veins Neg Hx     Social History Social History  Substance Use Topics  . Smoking status: Former Smoker    Packs/day: 0.25    Years: 6.00    Types: Cigarettes    Quit date: 01/27/2013  . Smokeless tobacco: Never Used  . Alcohol use No  Comment: Pt says she drank prior to pregnancy     Allergies   Patient has no known allergies.   Review of Systems Review of Systems  Neurological: Positive for headaches.  All other systems reviewed and are negative.    Physical Exam Updated Vital Signs BP 121/74 (BP Location: Left Arm)   Pulse 80   Temp 97.6 F (36.4 C) (Oral)   Resp 18   Wt 157 lb 12.8 oz (71.6 kg)   LMP 01/15/2016   SpO2 100%   BMI 26.26 kg/m   Physical Exam  Constitutional: She is oriented to person, place, and time. She appears well-developed and well-nourished. No distress.  HENT:  Head: Normocephalic and atraumatic.  Right Ear: External ear normal.  Left Ear: External ear normal.  Mouth/Throat: Oropharynx is clear and moist.  Eyes: Conjunctivae and EOM are normal. Pupils are equal, round, and reactive to light.  Neck: Normal range of motion and full passive range of motion without pain. Neck supple. No neck  rigidity.  No rigidity, no meningismus  Cardiovascular: Normal rate, regular rhythm and normal heart sounds.   No murmur heard. Pulmonary/Chest: Effort normal and breath sounds normal. No respiratory distress. She has no wheezes. She has no rhonchi.  Abdominal: Soft. Bowel sounds are normal. There is no tenderness. There is no guarding.  Musculoskeletal: Normal range of motion. She exhibits no edema.  Neurological: She is alert and oriented to person, place, and time. She has normal strength. She displays no tremor. No cranial nerve deficit or sensory deficit. She displays no seizure activity.  AAOx3, answering questions and following commands appropriately; equal strength UE and LE bilaterally; CN grossly intact; moves all extremities appropriately without ataxia; no focal neuro deficits or facial asymmetry appreciated  Skin: Skin is warm and dry. No rash noted. She is not diaphoretic.  Psychiatric: She has a normal mood and affect. Her behavior is normal. Thought content normal.  Nursing note and vitals reviewed.    ED Treatments / Results  DIAGNOSTIC STUDIES: Oxygen Saturation is 100% on RA, normal by my interpretation.   COORDINATION OF CARE: 2:02 PM- Will give Toradol and Compazine injection prior to discharge. Pt verbalizes understanding and agrees to plan.  Medications  ketorolac (TORADOL) injection 60 mg (60 mg Intramuscular Given 01/30/16 1422)  prochlorperazine (COMPAZINE) injection 10 mg (10 mg Intramuscular Given 01/30/16 1422)    Labs (all labs ordered are listed, but only abnormal results are displayed) Labs Reviewed - No data to display  EKG  EKG Interpretation None       Radiology No results found.  Procedures Procedures (including critical care time)  Medications Ordered in ED Medications  ketorolac (TORADOL) injection 60 mg (60 mg Intramuscular Given 01/30/16 1422)  prochlorperazine (COMPAZINE) injection 10 mg (10 mg Intramuscular Given 01/30/16 1422)      Initial Impression / Assessment and Plan / ED Course  I have reviewed the triage vital signs and the nursing notes.  Pertinent labs & imaging results that were available during my care of the patient were reviewed by me and considered in my medical decision making (see chart for details).  Clinical Course    21 year old female here with headache. She is afebrile and nontoxic. Her neurologic exam is nonfocal. No signs or symptoms concerning for meningitis. Patient was treated here with Toradol and Compazine with resolution of headache. Feel she is stable for discharge home. Recommended Excedrin or other similar products should headache her.  Discussed plan with patient, she acknowledged  understanding and agreed with plan of care.  Return precautions given for new or worsening symptoms.  I personally performed the services described in this documentation, which was scribed in my presence. The recorded information has been reviewed and is accurate.  Final Clinical Impressions(s) / ED Diagnoses   Final diagnoses:  Nonintractable headache, unspecified chronicity pattern, unspecified headache type    New Prescriptions New Prescriptions   No medications on file     Oletha BlendLisa M Tymira Horkey, PA-C 01/30/16 1605    Raeford RazorStephen Kohut, MD 02/01/16 1354

## 2016-03-06 ENCOUNTER — Encounter (HOSPITAL_COMMUNITY): Payer: Self-pay | Admitting: Emergency Medicine

## 2016-03-06 ENCOUNTER — Emergency Department (HOSPITAL_COMMUNITY): Payer: Medicaid Other

## 2016-03-06 ENCOUNTER — Emergency Department (HOSPITAL_COMMUNITY)
Admission: EM | Admit: 2016-03-06 | Discharge: 2016-03-06 | Disposition: A | Discharge status: Home / Self Care | Payer: Medicaid Other | Attending: Emergency Medicine | Admitting: Emergency Medicine

## 2016-03-06 DIAGNOSIS — Z87891 Personal history of nicotine dependence: Secondary | ICD-10-CM | POA: Insufficient documentation

## 2016-03-06 DIAGNOSIS — Z79899 Other long term (current) drug therapy: Secondary | ICD-10-CM | POA: Insufficient documentation

## 2016-03-06 DIAGNOSIS — R05 Cough: Secondary | ICD-10-CM | POA: Insufficient documentation

## 2016-03-06 DIAGNOSIS — R112 Nausea with vomiting, unspecified: Secondary | ICD-10-CM | POA: Diagnosis not present

## 2016-03-06 DIAGNOSIS — R52 Pain, unspecified: Secondary | ICD-10-CM | POA: Diagnosis not present

## 2016-03-06 DIAGNOSIS — R6889 Other general symptoms and signs: Secondary | ICD-10-CM

## 2016-03-06 LAB — URINALYSIS, ROUTINE W REFLEX MICROSCOPIC
BACTERIA UA: NONE SEEN
BILIRUBIN URINE: NEGATIVE
Glucose, UA: NEGATIVE mg/dL
HGB URINE DIPSTICK: NEGATIVE
KETONES UR: 20 mg/dL — AB
LEUKOCYTES UA: NEGATIVE
NITRITE: NEGATIVE
PROTEIN: 100 mg/dL — AB
SPECIFIC GRAVITY, URINE: 1.027 (ref 1.005–1.030)
pH: 5 (ref 5.0–8.0)

## 2016-03-06 LAB — CBC WITH DIFFERENTIAL/PLATELET
BASOS ABS: 0 10*3/uL (ref 0.0–0.1)
BASOS PCT: 0 %
Eosinophils Absolute: 0 10*3/uL (ref 0.0–0.7)
Eosinophils Relative: 0 %
HEMATOCRIT: 44.9 % (ref 36.0–46.0)
HEMOGLOBIN: 14.7 g/dL (ref 12.0–15.0)
LYMPHS PCT: 24 %
Lymphs Abs: 1.4 10*3/uL (ref 0.7–4.0)
MCH: 27.6 pg (ref 26.0–34.0)
MCHC: 32.7 g/dL (ref 30.0–36.0)
MCV: 84.2 fL (ref 78.0–100.0)
Monocytes Absolute: 0.6 10*3/uL (ref 0.1–1.0)
Monocytes Relative: 11 %
NEUTROS ABS: 3.7 10*3/uL (ref 1.7–7.7)
NEUTROS PCT: 65 %
Platelets: 239 10*3/uL (ref 150–400)
RBC: 5.33 MIL/uL — AB (ref 3.87–5.11)
RDW: 16.4 % — ABNORMAL HIGH (ref 11.5–15.5)
WBC: 5.6 10*3/uL (ref 4.0–10.5)

## 2016-03-06 LAB — COMPREHENSIVE METABOLIC PANEL
ALK PHOS: 52 U/L (ref 38–126)
ALT: 21 U/L (ref 14–54)
ANION GAP: 10 (ref 5–15)
AST: 25 U/L (ref 15–41)
Albumin: 4.3 g/dL (ref 3.5–5.0)
BILIRUBIN TOTAL: 0.3 mg/dL (ref 0.3–1.2)
BUN: 5 mg/dL — ABNORMAL LOW (ref 6–20)
CALCIUM: 9.5 mg/dL (ref 8.9–10.3)
CO2: 24 mmol/L (ref 22–32)
Chloride: 100 mmol/L — ABNORMAL LOW (ref 101–111)
Creatinine, Ser: 0.73 mg/dL (ref 0.44–1.00)
GFR calc non Af Amer: >60 mL/min (ref >60)
Glucose, Bld: 102 mg/dL — ABNORMAL HIGH (ref 65–99)
POTASSIUM: 3.4 mmol/L — AB (ref 3.5–5.1)
SODIUM: 134 mmol/L — AB (ref 135–145)
TOTAL PROTEIN: 8.1 g/dL (ref 6.5–8.1)

## 2016-03-06 LAB — POC URINE PREG, ED: PREG TEST UR: NEGATIVE

## 2016-03-06 MED ORDER — ONDANSETRON HCL 4 MG PO TABS: 4.0000 mg | ORAL_TABLET | Freq: Four times a day (QID) | ORAL | 0 refills | Status: DC

## 2016-03-06 MED ORDER — ACETAMINOPHEN 325 MG PO TABS
650.0000 mg | ORAL_TABLET | Freq: Once | ORAL | Status: AC | PRN
  Administered 2016-03-06: 650 mg via ORAL

## 2016-03-06 MED ORDER — IBUPROFEN 800 MG PO TABS: 800.0000 mg | ORAL_TABLET | Freq: Three times a day (TID) | ORAL | 0 refills | Status: DC

## 2016-03-06 MED ORDER — SODIUM CHLORIDE 0.9 % IV BOLUS (SEPSIS)
1000.0000 mL | Freq: Once | INTRAVENOUS | Status: AC
  Administered 2016-03-06: 1000 mL via INTRAVENOUS

## 2016-03-06 MED ORDER — ACETAMINOPHEN 325 MG PO TABS
ORAL_TABLET | ORAL | Status: AC
  Filled 2016-03-06: qty 2

## 2016-03-06 MED ORDER — ONDANSETRON HCL 4 MG/2ML IJ SOLN
4.0000 mg | Freq: Once | INTRAMUSCULAR | Status: AC
  Administered 2016-03-06: 4 mg via INTRAVENOUS
  Filled 2016-03-06: qty 2

## 2016-03-06 MED ORDER — BENZONATATE 100 MG PO CAPS: 100.0000 mg | ORAL_CAPSULE | Freq: Three times a day (TID) | ORAL | 0 refills | Status: DC

## 2016-03-06 NOTE — ED Triage Notes (Signed)
C/o fever, chills, nausea, vomited x 1, diarrhea x2, non-productive cough, and generalized body aches x 2 days.

## 2016-03-06 NOTE — ED Provider Notes (Signed)
[facility]-EMERGENCY DEPT Provider Note   CSN: [id number] Arrival date & time: 03/06/16  1731     History   Chief Complaint Chief Complaint  Patient presents with  . flu like symptoms    HPI Julie Cline is a 21 y.o. female who presents with a three-day history of fever, nonproductive cough, nausea, one episode of vomiting, and generalized body aches. Patient has been able tolerate fluids. Patient has been taking DayQuil with good relief. She states she has felt better today than the past few days. She feels that she has been improving. Patient denies any chest pain, shortness of breath, abdominal pain, diarrhea, blood in stool, urinary symptoms.  HPI  Past Medical History:  Diagnosis Date  . Chlamydia   . Medical history non-contributory   . Urinary tract infection     Patient Active Problem List   Diagnosis Date Noted  . Post term pregnancy at [redacted] weeks gestation 11/01/2015  . Supervision of high risk pregnancy, antepartum 07/08/2015  . Vaginal bleeding in pregnancy   . Placental abruption 07/06/2015    Past Surgical History:  Procedure Laterality Date  . EYE SURGERY     Eye lid  . HERNIA REPAIR      OB History    Gravida Para Term Preterm AB Living   1 1 1  0 0 1   SAB TAB Ectopic Multiple Live Births   0 0 0 0 1       Home Medications    Prior to Admission medications   Medication Sig Start Date End Date Taking? Authorizing Provider  benzonatate (TESSALON) 100 MG capsule Take 1 capsule (100 mg total) by mouth every 8 (eight) hours. 03/06/16   Emi Holes, PA-C  ibuprofen (ADVIL,MOTRIN) 800 MG tablet Take 1 tablet (800 mg total) by mouth 3 (three) times daily. 03/06/16   Waylan Boga Caryl Manas, PA-C  ondansetron (ZOFRAN) 4 MG tablet Take 1 tablet (4 mg total) by mouth every 6 (six) hours. 03/06/16   Joel Cowin M Keyonna [place], PA-C  senna-docusate (SENOKOT-S) 8.6-50 MG tablet Take 2 tablets by mouth daily. 11/03/15   Tillman Sers, DO    Family History Family History    Problem Relation Age of Onset  . Diabetes Maternal Uncle   . Diabetes Paternal Grandfather   . Alcohol abuse Neg Hx   . Arthritis Neg Hx   . Asthma Neg Hx   . Birth defects Neg Hx   . Cancer Neg Hx   . COPD Neg Hx   . Depression Neg Hx   . Drug abuse Neg Hx   . Early death Neg Hx   . Hearing loss Neg Hx   . Heart disease Neg Hx   . Hyperlipidemia Neg Hx   . Hypertension Neg Hx   . Kidney disease Neg Hx   . Learning disabilities Neg Hx   . Mental illness Neg Hx   . Mental retardation Neg Hx   . Miscarriages / Stillbirths Neg Hx   . Stroke Neg Hx   . Vision loss Neg Hx   . Varicose Veins Neg Hx     Social History Social History  Substance Use Topics  . Smoking status: Former Smoker    Packs/day: 0.25    Years: 6.00    Types: Cigarettes    Quit date: 01/27/2013  . Smokeless tobacco: Never Used  . Alcohol use Yes     Allergies   Patient has no known allergies.   Review of Systems Review  of Systems  Constitutional: Positive for fatigue. Negative for chills and fever.  HENT: Negative for ear pain, facial swelling and sore throat.   Respiratory: Positive for cough. Negative for shortness of breath.   Cardiovascular: Negative for chest pain.  Gastrointestinal: Positive for nausea and vomiting. Negative for abdominal pain, blood in stool and diarrhea.  Genitourinary: Negative for dysuria.  Musculoskeletal: Positive for myalgias. Negative for neck pain.  Skin: Negative for rash and wound.  Neurological: Negative for headaches.  Psychiatric/Behavioral: The patient is not nervous/anxious.      Physical Exam Updated Vital Signs BP 120/57 (BP Location: Right Arm)   Pulse 85   Temp 98.2 F (36.8 C) (Oral)   Resp 18   LMP 02/12/2016   SpO2 98%   Physical Exam  Constitutional: She appears well-developed and well-nourished. No distress.  HENT:  Head: Normocephalic and atraumatic.  Mouth/Throat: Oropharynx is clear and moist. No oropharyngeal exudate.  Eyes:  Conjunctivae are normal. Pupils are equal, round, and reactive to light. Right eye exhibits no discharge. Left eye exhibits no discharge. No scleral icterus.  Neck: Normal range of motion. Neck supple. No thyromegaly present.  Cardiovascular: Normal rate, regular rhythm, normal heart sounds and intact distal pulses.  Exam reveals no gallop and no friction rub.   No murmur heard. Pulmonary/Chest: Effort normal and breath sounds normal. No stridor. No respiratory distress. She has no wheezes. She has no rales.  Abdominal: Soft. Bowel sounds are normal. She exhibits no distension. There is no tenderness. There is no rebound and no guarding.  Musculoskeletal: She exhibits no edema.  Lymphadenopathy:    She has no cervical adenopathy.  Neurological: She is alert. Coordination normal.  Skin: Skin is warm and dry. No rash noted. She is not diaphoretic. No pallor.  Psychiatric: She has a normal mood and affect.  Nursing note and vitals reviewed.    ED Treatments / Results  Labs (all labs ordered are listed, but only abnormal results are displayed) Labs Reviewed  COMPREHENSIVE METABOLIC PANEL - Abnormal; Notable for the following:       Result Value   Sodium 134 (*)    Potassium 3.4 (*)    Chloride 100 (*)    Glucose, Bld 102 (*)    BUN 5 (*)    All other components within normal limits  CBC WITH DIFFERENTIAL/PLATELET - Abnormal; Notable for the following:    RBC 5.33 (*)    RDW 16.4 (*)    All other components within normal limits  URINALYSIS, ROUTINE W REFLEX MICROSCOPIC - Abnormal; Notable for the following:    APPearance HAZY (*)    Ketones, ur 20 (*)    Protein, ur 100 (*)    Squamous Epithelial / LPF 0-5 (*)    All other components within normal limits  I-STAT CG4 LACTIC ACID, ED  POC URINE PREG, ED    EKG  EKG Interpretation None       Radiology Dg Chest 2 View  Result Date: 03/06/2016 CLINICAL DATA:  Fever, dry cough and chills EXAM: CHEST  2 VIEW COMPARISON:  None.  FINDINGS: The heart size and mediastinal contours are within normal limits. Both lungs are clear. The visualized skeletal structures are unremarkable. IMPRESSION: No active cardiopulmonary disease. Electronically Signed   By: Tollie Eth M.D.   On: 03/06/2016 19:44    Procedures Procedures (including critical care time)  Medications Ordered in ED Medications  acetaminophen (TYLENOL) tablet 650 mg (650 mg Oral Given 03/06/16 1848)  sodium chloride 0.9 % bolus 1,000 mL (0 mLs Intravenous Stopped 03/06/16 2136)  ondansetron (ZOFRAN) injection 4 mg (4 mg Intravenous Given 03/06/16 2016)     Initial Impression / Assessment and Plan / ED Course  I have reviewed the triage vital signs and the nursing notes.  Pertinent labs & imaging results that were available during my care of the patient were reviewed by me and considered in my medical decision making (see chart for details).     Patient with symptoms consistent with influenza.  Vitals are stable, low-grade fever.  No signs of dehydration, tolerating PO's. CBC unremarkable. CMP shows sodium 134, potassium 3.4, chloride 100, glucose 102, BUN 5. UA shows 20 ketones, 100 protein. Urine pregnancy negative. CXR shows no active cardiac pulmonary disease. Discussed the cost versus benefit of Tamiflu treatment with the patient.  The patient understands that symptoms are greater than the recommended 24-48 hour window of treatment. Patient given 1 L of IV fluids and Zofran in the ED with good improvement of symptoms. Patient tolerating oral fluids as well. Patient will be discharged with instructions to orally hydrate, rest, and use over-the-counter medications such as anti-inflammatories ibuprofen and Aleve for muscle aches and Tylenol for fever.  Patient will also be given a cough suppressant and Zofran. Return precautions discussed. Patient understands and agrees with plan. Patient vitals stable throughout ED course and discharged in satisfactory  condition.   Final Clinical Impressions(s) / ED Diagnoses   Final diagnoses:  Flu-like symptoms    New Prescriptions New Prescriptions   BENZONATATE (TESSALON) 100 MG CAPSULE    Take 1 capsule (100 mg total) by mouth every 8 (eight) hours.   IBUPROFEN (ADVIL,MOTRIN) 800 MG TABLET    Take 1 tablet (800 mg total) by mouth 3 (three) times daily.   ONDANSETRON (ZOFRAN) 4 MG TABLET    Take 1 tablet (4 mg total) by mouth every 6 (six) hours.     Emi Holeslexandra M Braiden Rodman, PA-C 03/06/16 2155    Canary Brimhristopher J Tegeler, MD 03/07/16 1125

## 2016-03-06 NOTE — ED Notes (Signed)
RN offered PO fluids to pt. Pt stated she has been drinking her ginger ale and feels "fine". Pt denies any nausea.

## 2016-03-06 NOTE — Discharge Instructions (Signed)
Medications: Zofran, Tessalon, ibuprofen  Treatment: Take Zofran every 6 hours as needed for nausea or vomiting. Take Tessalon every 8 hours as needed for cough. Take ibuprofen every 8 hours as needed for body aches. You can alternate with Tylenol as prescribed over-the-counter. Make sure to drink plenty of water. Begin eating bland foods such as bananas, rice, toast, applesauce, baked chicken. Avoid greasy or fried foods initially.  Follow-up: Please return to the emergency department if you develop any new or worsening symptoms.

## 2016-03-06 NOTE — ED Notes (Signed)
Called pt to room from lobby. No response.

## 2016-03-18 ENCOUNTER — Encounter: Payer: Self-pay | Admitting: Obstetrics & Gynecology

## 2016-03-18 ENCOUNTER — Ambulatory Visit (INDEPENDENT_AMBULATORY_CARE_PROVIDER_SITE_OTHER): Payer: Self-pay | Admitting: *Deleted

## 2016-03-18 DIAGNOSIS — Z3200 Encounter for pregnancy test, result unknown: Secondary | ICD-10-CM

## 2016-03-18 DIAGNOSIS — Z3201 Encounter for pregnancy test, result positive: Secondary | ICD-10-CM

## 2016-03-18 NOTE — Progress Notes (Signed)
+  UPT today.  LMP - 02/06/16.  EDD - 11/12/16.  Pt desires to receive prenatal care at this office. Medication reconciliation completed.

## 2016-03-19 LAB — POCT PREGNANCY, URINE: Preg Test, Ur: POSITIVE — AB

## 2016-04-30 ENCOUNTER — Ambulatory Visit: Payer: Self-pay

## 2016-04-30 ENCOUNTER — Inpatient Hospital Stay (HOSPITAL_COMMUNITY): Payer: Medicaid Other

## 2016-04-30 ENCOUNTER — Other Ambulatory Visit (HOSPITAL_COMMUNITY)
Admission: RE | Admit: 2016-04-30 | Discharge: 2016-04-30 | Disposition: A | Discharge status: Home / Self Care | Payer: Medicaid Other | Source: Ambulatory Visit | Attending: Medical | Admitting: Medical

## 2016-04-30 ENCOUNTER — Encounter: Payer: Self-pay | Admitting: Medical

## 2016-04-30 ENCOUNTER — Inpatient Hospital Stay (HOSPITAL_COMMUNITY)
Admission: AD | Admit: 2016-04-30 | Discharge: 2016-04-30 | Disposition: A | Discharge status: Home / Self Care | Payer: Medicaid Other | Source: Ambulatory Visit | Attending: Family Medicine | Admitting: Family Medicine

## 2016-04-30 ENCOUNTER — Ambulatory Visit (INDEPENDENT_AMBULATORY_CARE_PROVIDER_SITE_OTHER): Payer: Self-pay | Admitting: Medical

## 2016-04-30 VITALS — BP 101/58 | HR 62 | Wt 146.3 lb

## 2016-04-30 DIAGNOSIS — Z3A12 12 weeks gestation of pregnancy: Secondary | ICD-10-CM | POA: Diagnosis not present

## 2016-04-30 DIAGNOSIS — O021 Missed abortion: Secondary | ICD-10-CM | POA: Insufficient documentation

## 2016-04-30 DIAGNOSIS — O3680X0 Pregnancy with inconclusive fetal viability, not applicable or unspecified: Secondary | ICD-10-CM

## 2016-04-30 DIAGNOSIS — Z3481 Encounter for supervision of other normal pregnancy, first trimester: Secondary | ICD-10-CM | POA: Diagnosis not present

## 2016-04-30 DIAGNOSIS — Z113 Encounter for screening for infections with a predominantly sexual mode of transmission: Secondary | ICD-10-CM

## 2016-04-30 DIAGNOSIS — IMO0002 Reserved for concepts with insufficient information to code with codable children: Secondary | ICD-10-CM

## 2016-04-30 DIAGNOSIS — Z3490 Encounter for supervision of normal pregnancy, unspecified, unspecified trimester: Secondary | ICD-10-CM | POA: Insufficient documentation

## 2016-04-30 DIAGNOSIS — Z124 Encounter for screening for malignant neoplasm of cervix: Secondary | ICD-10-CM

## 2016-04-30 DIAGNOSIS — Z3480 Encounter for supervision of other normal pregnancy, unspecified trimester: Secondary | ICD-10-CM

## 2016-04-30 DIAGNOSIS — Z8759 Personal history of other complications of pregnancy, childbirth and the puerperium: Secondary | ICD-10-CM

## 2016-04-30 MED ORDER — IBUPROFEN 600 MG PO TABS: 600.0000 mg | ORAL_TABLET | Freq: Four times a day (QID) | ORAL | 0 refills | Status: DC | PRN

## 2016-04-30 MED ORDER — MISOPROSTOL 200 MCG PO TABS: 800.0000 ug | ORAL_TABLET | Freq: Once | ORAL | 0 refills | Status: DC

## 2016-04-30 MED ORDER — HYDROCODONE-ACETAMINOPHEN 5-325 MG PO TABS: 1.0000 | ORAL_TABLET | ORAL | 0 refills | Status: DC | PRN

## 2016-04-30 NOTE — MAU Note (Signed)
sent up from clinic for viability scan.  Too late in day for outpt addon.

## 2016-04-30 NOTE — MAU Provider Note (Signed)
S:  [name] is a 21 y.o. female G2P1001 @ [redacted]w[redacted]d by certain LMP here in MAU for an [place]. She was seen in the [facility] for her first prenatal appointment and fetal heart tones were not heard. She was sent here for an [place] for viability.    O:  GENERAL: Well-developed, well-nourished female in no acute distress.  LUNGS: Effort normal SKIN: Warm, dry and without erythema PSYCH: Normal mood and affect  Vitals:   04/30/16 1851 04/30/16 1925  BP: 117/72 127/69  Pulse: 63 66  Resp: 16 17  Temp: 98.6 F (37 C)    MDM  US Ob Comp Less 14 Wks  Result Date: 04/30/2016 CLINICAL DATA:  Fetal heart tones not heard EXAM: OBSTETRIC <14 WK [place] AND TRANSVAGINAL OB US TECHNIQUE: Both transabdominal and transvaginal ultrasound examinations were performed for complete evaluation of the gestation as well as the maternal uterus, adnexal regions, and pelvic cul-de-sac. Transvaginal technique was performed to assess early pregnancy. COMPARISON:  None. FINDINGS: Intrauterine gestational sac: Single intrauterine gestational sac Yolk sac:  Visualized Embryo:  Visualized Cardiac Activity: Not visualized CRL:  23  mm   8 w   6 d                  [place] EDC: 12/04/2016 Subchorionic hemorrhage: Small subchorionic hemorrhage along the left inferior aspect of the gestational sac. Maternal uterus/adnexae: Bilateral ovaries are within normal limits. Left ovary measures 2.5 x 1 point by 1.3 cm. Right ovary measures 1.9 x 1.9 x 2.1 cm. No free fluid. IMPRESSION: 1. Single intrauterine gestation with crown-rump length measurement of 23 mm and no cardiac activity. Findings meet definitive criteria for failed pregnancy. This follows SRU consensus guidelines: Diagnostic Criteria for Nonviable Pregnancy Early in the First Trimester. Macy Mis J Med [phone]. 2. Small subchorionic hemorrhage. Electronically Signed   By: Jasmine Pang M.D.   On: 04/30/2016 18:14   US Ob Transvaginal  Result Date: 04/30/2016 CLINICAL DATA:  Fetal  heart tones not heard EXAM: OBSTETRIC <14 WK [place] AND TRANSVAGINAL OB US TECHNIQUE: Both transabdominal and transvaginal ultrasound examinations were performed for complete evaluation of the gestation as well as the maternal uterus, adnexal regions, and pelvic cul-de-sac. Transvaginal technique was performed to assess early pregnancy. COMPARISON:  None. FINDINGS: Intrauterine gestational sac: Single intrauterine gestational sac Yolk sac:  Visualized Embryo:  Visualized Cardiac Activity: Not visualized CRL:  23  mm   8 w   6 d                  [place] EDC: 12/04/2016 Subchorionic hemorrhage: Small subchorionic hemorrhage along the left inferior aspect of the gestational sac. Maternal uterus/adnexae: Bilateral ovaries are within normal limits. Left ovary measures 2.5 x 1 point by 1.3 cm. Right ovary measures 1.9 x 1.9 x 2.1 cm. No free fluid. IMPRESSION: 1. Single intrauterine gestation with crown-rump length measurement of 23 mm and no cardiac activity. Findings meet definitive criteria for failed pregnancy. This follows SRU consensus guidelines: Diagnostic Criteria for Nonviable Pregnancy Early in the First Trimester. Macy Mis J Med [id number]. 2. Small subchorionic hemorrhage. Electronically Signed   By: Jasmine Pang M.D.   On: 04/30/2016 18:14    US showing failed pregnancy at [redacted]w[redacted]d. Offered watchful waiting vs. cytotec. Patient states she is not interested in using cytotec at this time and would like to see if she passes the sac on her own. Patient agreeable to me sending an RX for cytotec and for  her to pick it up if bleeding has not started in 5-7 days.   A positive blood type    A:  1. Missed abortion   2. Fetal heart tones not heard     P:  Discharge home in stable condition Rx: Cytotec, Ibuprofen, vicodin.  Bleeding precautions Strict return precautions to MAU.  Patient to call the clinic for follow up.    Duane Lope, NP 04/30/2016 7:57 PM

## 2016-04-30 NOTE — MAU Note (Signed)
Comfort care packet and support resources given and discussed with patient prior to discharge.

## 2016-04-30 NOTE — MAU Note (Signed)
Feels fine, "I feel normal."  Denies any pain or bleeding.

## 2016-04-30 NOTE — Progress Notes (Signed)
Pt informed that the ultrasound is considered a limited OB ultrasound and is not intended to be a complete ultrasound exam.  Patient also informed that the ultrasound is not being completed with the intent of assessing for fetal or placental anomalies or any pelvic abnormalities.  Explained that the purpose of today's ultrasound is to assess for viability.  Patient acknowledges the purpose of the exam and the limitations of the study.    Single IUP visualized Cardiac activity - visually absent Size and shape of embryo is visually inconsistent with [redacted]wks EGA  Julie Cline notified of results.  Pt will be sent to [place] dept for confirmatory [place].

## 2016-04-30 NOTE — Discharge Instructions (Signed)

## 2016-04-30 NOTE — Progress Notes (Signed)
   PRENATAL VISIT NOTE  Subjective:  Julie Cline is a 21 y.o. G2P1001 at [redacted]w[redacted]d being seen today for her initial prenatal visit.  She is currently monitored for the following issues for this low-risk pregnancy and has Supervision of normal pregnancy and History of placenta abruption on her problem list.  Patient reports no complaints.  Contractions: Not present. Vag. Bleeding: None.   . Denies leaking of fluid.   The following portions of the patient's history were reviewed and updated as appropriate: allergies, current medications, past family history, past medical history, past social history, past surgical history and problem list. Problem list updated.  Objective:   Vitals:   04/30/16 1451  BP: (!) 101/58  Pulse: 62  Weight: 146 lb 4.8 oz (66.4 kg)    Fetal Status:           General:  Alert, oriented and cooperative. Patient is in no acute distress.  Skin: Skin is warm and dry. No rash noted.   Cardiovascular: Normal heart rate noted  Respiratory: Normal respiratory effort, no problems with respiration noted  Abdomen: Soft, gravid, appropriate for gestational age. Pain/Pressure: Absent     Pelvic:  Cervical exam performed      closed/thick/firm  Extremities: Normal range of motion.  Edema: None  Mental Status: Normal mood and affect. Normal behavior. Normal judgment and thought content.   Assessment and Plan:  Pregnancy: G2P1001 at [redacted]w[redacted]d  1. Encounter for supervision of other normal pregnancy in first trimester - Prenatal Profile I - HIV antibody (with reflex) - Cytology - PAP - [place] MFM Fetal Nuchal Translucency; scheduled  - US OB Limited - to assess FHTs  2. History of placenta abruption - [place] MFM Fetal Nuchal Translucency; scheduled - No bleeding today, abruption with last pregnancy, resolved  4. Encounter to determine fetal viability of pregnancy, single or unspecified fetus - unable to obtain FHTs with doppler - US OB Limited; office performed. Unable to  detect FHTs. Unable to get patient outpatient [place] scheduled today, consulted with Dr. Adrian Blackwater. Ok to send to MAU for viability [place].   First trimester warning symptoms and general obstetric precautions including but not limited to vaginal bleeding, contractions, leaking of fluid and fetal movement were reviewed in detail with the patient. Please refer to After Visit Summary for other counseling recommendations.  Return in about 4 weeks (around 05/28/2016) for LOB.   Marny Lowenstein, PA-C

## 2016-04-30 NOTE — Patient Instructions (Signed)
First Trimester of Pregnancy The first trimester of pregnancy is from week 1 until the end of week 13 (months 1 through 3). During this time, your baby will begin to develop inside you. At 6-8 weeks, the eyes and face are formed, and the heartbeat can be seen on ultrasound. At the end of 12 weeks, all the baby's organs are formed. Prenatal care is all the medical care you receive before the birth of your baby. Make sure you get good prenatal care and follow all of your doctor's instructions. Follow these instructions at home: Medicines  Take over-the-counter and prescription medicines only as told by your doctor. Some medicines are safe and some medicines are not safe during pregnancy.  Take a prenatal vitamin that contains at least 600 micrograms (mcg) of folic acid.  If you have trouble pooping (constipation), take medicine that will make your stool soft (stool softener) if your doctor approves. Eating and drinking  Eat regular, healthy meals.  Your doctor will tell you the amount of weight gain that is right for you.  Avoid raw meat and uncooked cheese.  If you feel sick to your stomach (nauseous) or throw up (vomit): ? Eat 4 or 5 small meals a day instead of 3 large meals. ? Try eating a few soda crackers. ? Drink liquids between meals instead of during meals.  To prevent constipation: ? Eat foods that are high in fiber, like fresh fruits and vegetables, whole grains, and beans. ? Drink enough fluids to keep your pee (urine) clear or pale yellow. Activity  Exercise only as told by your doctor. Stop exercising if you have cramps or pain in your lower belly (abdomen) or low back.  Do not exercise if it is too hot, too humid, or if you are in a place of great height (high altitude).  Try to avoid standing for long periods of time. Move your legs often if you must stand in one place for a long time.  Avoid heavy lifting.  Wear low-heeled shoes. Sit and stand up straight.  You  can have sex unless your doctor tells you not to. Relieving pain and discomfort  Wear a good support bra if your breasts are sore.  Take warm water baths (sitz baths) to soothe pain or discomfort caused by hemorrhoids. Use hemorrhoid cream if your doctor says it is okay.  Rest with your legs raised if you have leg cramps or low back pain.  If you have puffy, bulging veins (varicose veins) in your legs: ? Wear support hose or compression stockings as told by your doctor. ? Raise (elevate) your feet for 15 minutes, 3-4 times a day. ? Limit salt in your food. Prenatal care  Schedule your prenatal visits by the twelfth week of pregnancy.  Write down your questions. Take them to your prenatal visits.  Keep all your prenatal visits as told by your doctor. This is important. Safety  Wear your seat belt at all times when driving.  Make a list of emergency phone numbers. The list should include numbers for family, friends, the hospital, and police and fire departments. General instructions  Ask your doctor for a referral to a local prenatal class. Begin classes no later than at the start of month 6 of your pregnancy.  Ask for help if you need counseling or if you need help with nutrition. Your doctor can give you advice or tell you where to go for help.  Do not use hot tubs, steam rooms, or   saunas.  Do not douche or use tampons or scented sanitary pads.  Do not cross your legs for long periods of time.  Avoid all herbs and alcohol. Avoid drugs that are not approved by your doctor.  Do not use any tobacco products, including cigarettes, chewing tobacco, and electronic cigarettes. If you need help quitting, ask your doctor. You may get counseling or other support to help you quit.  Avoid cat litter boxes and soil used by cats. These carry germs that can cause birth defects in the baby and can cause a loss of your baby (miscarriage) or stillbirth.  Visit your dentist. At home, brush  your teeth with a soft toothbrush. Be gentle when you floss. Contact a doctor if:  You are dizzy.  You have mild cramps or pressure in your lower belly.  You have a nagging pain in your belly area.  You continue to feel sick to your stomach, you throw up, or you have watery poop (diarrhea).  You have a bad smelling fluid coming from your vagina.  You have pain when you pee (urinate).  You have increased puffiness (swelling) in your face, hands, legs, or ankles. Get help right away if:  You have a fever.  You are leaking fluid from your vagina.  You have spotting or bleeding from your vagina.  You have very bad belly cramping or pain.  You gain or lose weight rapidly.  You throw up blood. It may look like coffee grounds.  You are around people who have German measles, fifth disease, or chickenpox.  You have a very bad headache.  You have shortness of breath.  You have any kind of trauma, such as from a fall or a car accident. Summary  The first trimester of pregnancy is from week 1 until the end of week 13 (months 1 through 3).  To take care of yourself and your unborn baby, you will need to eat healthy meals, take medicines only if your doctor tells you to do so, and do activities that are safe for you and your baby.  Keep all follow-up visits as told by your doctor. This is important as your doctor will have to ensure that your baby is healthy and growing well. This information is not intended to replace advice given to you by your health care provider. Make sure you discuss any questions you have with your health care provider. Document Released: 07/03/2007 Document Revised: 01/23/2016 Document Reviewed: 01/23/2016 Elsevier Interactive Patient Education  2017 [employer]  

## 2016-04-30 NOTE — MAU Note (Signed)
Pt. Not in lobby for U/S, 1st call

## 2016-05-01 LAB — PRENATAL PROFILE I(LABCORP)
Antibody Screen: NEGATIVE
BASOS: 0 %
Basophils Absolute: 0 10*3/uL (ref 0.0–0.2)
EOS (ABSOLUTE): 0.1 10*3/uL (ref 0.0–0.4)
EOS: 1 %
HEMATOCRIT: 38.2 % (ref 34.0–46.6)
HEP B S AG: NEGATIVE
Hemoglobin: 12.7 g/dL (ref 11.1–15.9)
Immature Grans (Abs): 0 10*3/uL (ref 0.0–0.1)
Immature Granulocytes: 0 %
LYMPHS ABS: 1.7 10*3/uL (ref 0.7–3.1)
Lymphs: 31 %
MCH: 28.4 pg (ref 26.6–33.0)
MCHC: 33.2 g/dL (ref 31.5–35.7)
MCV: 86 fL (ref 79–97)
MONOCYTES: 6 %
Monocytes Absolute: 0.4 10*3/uL (ref 0.1–0.9)
NEUTROS ABS: 3.4 10*3/uL (ref 1.4–7.0)
Neutrophils: 62 %
Platelets: 315 10*3/uL (ref 150–379)
RBC: 4.47 x10E6/uL (ref 3.77–5.28)
RDW: 16.7 % — ABNORMAL HIGH (ref 12.3–15.4)
RH TYPE: POSITIVE
RPR: NONREACTIVE
RUBELLA: 1.25 {index} (ref >0.99)
WBC: 5.6 10*3/uL (ref 3.4–10.8)

## 2016-05-01 LAB — HIV ANTIBODY (ROUTINE TESTING W REFLEX): HIV Screen 4th Generation wRfx: NONREACTIVE

## 2016-05-03 LAB — CYTOLOGY - PAP
Chlamydia: NEGATIVE
Diagnosis: NEGATIVE
Neisseria Gonorrhea: NEGATIVE

## 2016-05-10 ENCOUNTER — Ambulatory Visit (HOSPITAL_COMMUNITY): Payer: Medicaid Other | Attending: Medical

## 2016-05-10 ENCOUNTER — Ambulatory Visit (HOSPITAL_COMMUNITY): Admission: RE | Admit: 2016-05-10 | Payer: Medicaid Other | Source: Ambulatory Visit

## 2016-06-12 ENCOUNTER — Encounter (HOSPITAL_COMMUNITY): Payer: Self-pay

## 2016-06-12 ENCOUNTER — Emergency Department (HOSPITAL_COMMUNITY)
Admission: EM | Admit: 2016-06-12 | Discharge: 2016-06-13 | Disposition: A | Discharge status: Home / Self Care | Payer: No Typology Code available for payment source | Attending: Emergency Medicine | Admitting: Emergency Medicine

## 2016-06-12 ENCOUNTER — Emergency Department (HOSPITAL_COMMUNITY): Payer: No Typology Code available for payment source

## 2016-06-12 ENCOUNTER — Emergency Department (HOSPITAL_COMMUNITY)
Admission: EM | Admit: 2016-06-12 | Discharge: 2016-06-12 | Disposition: A | Discharge status: Home / Self Care | Payer: No Typology Code available for payment source | Source: Home / Self Care | Attending: Emergency Medicine | Admitting: Emergency Medicine

## 2016-06-12 DIAGNOSIS — Y939 Activity, unspecified: Secondary | ICD-10-CM | POA: Insufficient documentation

## 2016-06-12 DIAGNOSIS — Z79899 Other long term (current) drug therapy: Secondary | ICD-10-CM | POA: Insufficient documentation

## 2016-06-12 DIAGNOSIS — S0081XA Abrasion of other part of head, initial encounter: Secondary | ICD-10-CM

## 2016-06-12 DIAGNOSIS — Y92410 Unspecified street and highway as the place of occurrence of the external cause: Secondary | ICD-10-CM | POA: Insufficient documentation

## 2016-06-12 DIAGNOSIS — G43809 Other migraine, not intractable, without status migrainosus: Secondary | ICD-10-CM

## 2016-06-12 DIAGNOSIS — Z87891 Personal history of nicotine dependence: Secondary | ICD-10-CM

## 2016-06-12 DIAGNOSIS — Y999 Unspecified external cause status: Secondary | ICD-10-CM | POA: Insufficient documentation

## 2016-06-12 DIAGNOSIS — S060X0A Concussion without loss of consciousness, initial encounter: Secondary | ICD-10-CM

## 2016-06-12 DIAGNOSIS — S06330A Contusion and laceration of cerebrum, unspecified, without loss of consciousness, initial encounter: Secondary | ICD-10-CM

## 2016-06-12 DIAGNOSIS — S0990XA Unspecified injury of head, initial encounter: Secondary | ICD-10-CM | POA: Diagnosis present

## 2016-06-12 MED ORDER — DIPHENHYDRAMINE HCL 25 MG PO CAPS
50.0000 mg | ORAL_CAPSULE | Freq: Once | ORAL | Status: AC
  Administered 2016-06-12: 50 mg via ORAL
  Filled 2016-06-12: qty 2

## 2016-06-12 MED ORDER — OXYCODONE HCL 5 MG PO TABS: 5.0000 mg | ORAL_TABLET | Freq: Four times a day (QID) | ORAL | 0 refills | Status: DC | PRN

## 2016-06-12 MED ORDER — METOCLOPRAMIDE HCL 10 MG PO TABS
10.0000 mg | ORAL_TABLET | Freq: Once | ORAL | Status: AC
  Administered 2016-06-12: 10 mg via ORAL
  Filled 2016-06-12: qty 1

## 2016-06-12 MED ORDER — IBUPROFEN 800 MG PO TABS
800.0000 mg | ORAL_TABLET | Freq: Once | ORAL | Status: AC
  Administered 2016-06-12: 800 mg via ORAL
  Filled 2016-06-12: qty 1

## 2016-06-12 MED ORDER — BENZOCAINE 20 % MT GEL: 1.0000 "application " | Freq: Three times a day (TID) | OROMUCOSAL | 0 refills | Status: DC | PRN

## 2016-06-12 MED ORDER — ACETAMINOPHEN 500 MG PO TABS
1000.0000 mg | ORAL_TABLET | Freq: Once | ORAL | Status: AC
Start: 2016-06-12 — End: 2016-06-12
  Administered 2016-06-12: 1000 mg via ORAL
  Filled 2016-06-12: qty 2
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
## 2016-06-12 NOTE — ED Notes (Signed)
Pt declined DC vitals 
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
## 2016-06-12 NOTE — ED Notes (Signed)
ED Provider at bedside. 

## 2016-06-13 MED ORDER — PROCHLORPERAZINE MALEATE 10 MG PO TABS: 10.0000 mg | ORAL_TABLET | Freq: Two times a day (BID) | ORAL | 0 refills | Status: DC | PRN

## 2016-06-25 ENCOUNTER — Emergency Department (HOSPITAL_COMMUNITY)
Admission: EM | Admit: 2016-06-25 | Discharge: 2016-06-26 | Disposition: A | Discharge status: Home / Self Care | Payer: Medicaid Other | Attending: Dermatology | Admitting: Dermatology

## 2016-06-25 DIAGNOSIS — Z3A12 12 weeks gestation of pregnancy: Secondary | ICD-10-CM | POA: Diagnosis not present

## 2016-06-25 DIAGNOSIS — Z5321 Procedure and treatment not carried out due to patient leaving prior to being seen by health care provider: Secondary | ICD-10-CM | POA: Insufficient documentation

## 2016-06-25 DIAGNOSIS — O26891 Other specified pregnancy related conditions, first trimester: Secondary | ICD-10-CM | POA: Diagnosis not present

## 2016-06-25 NOTE — ED Notes (Signed)
Pt stated as I was checking another pts vitals that she was leaving she was not waiting any longer.

## 2016-06-25 NOTE — ED Triage Notes (Signed)
Pt presents to the er for complaints of back pain, she had a miscarriage 2 weeks ago, states that she was [redacted] weeks pregnant.  Has been having back pain for two weeks now and is still bleeding and is scared she has an infection.  Denies having to have a D&C done and reports not going to a follow up appointment.

## 2016-07-04 ENCOUNTER — Encounter (HOSPITAL_COMMUNITY): Payer: Self-pay | Admitting: *Deleted

## 2016-07-04 ENCOUNTER — Inpatient Hospital Stay (HOSPITAL_COMMUNITY)
Admission: AD | Admit: 2016-07-04 | Discharge: 2016-07-04 | Disposition: A | Discharge status: Home / Self Care | Payer: Medicaid Other | Source: Ambulatory Visit | Attending: Family Medicine | Admitting: Family Medicine

## 2016-07-04 DIAGNOSIS — R1084 Generalized abdominal pain: Secondary | ICD-10-CM | POA: Insufficient documentation

## 2016-07-04 DIAGNOSIS — R195 Other fecal abnormalities: Secondary | ICD-10-CM

## 2016-07-04 DIAGNOSIS — Z87891 Personal history of nicotine dependence: Secondary | ICD-10-CM | POA: Diagnosis not present

## 2016-07-04 DIAGNOSIS — A5901 Trichomonal vulvovaginitis: Secondary | ICD-10-CM | POA: Diagnosis not present

## 2016-07-04 DIAGNOSIS — O039 Complete or unspecified spontaneous abortion without complication: Secondary | ICD-10-CM | POA: Diagnosis not present

## 2016-07-04 DIAGNOSIS — R109 Unspecified abdominal pain: Secondary | ICD-10-CM

## 2016-07-04 DIAGNOSIS — N939 Abnormal uterine and vaginal bleeding, unspecified: Secondary | ICD-10-CM | POA: Diagnosis present

## 2016-07-04 DIAGNOSIS — R197 Diarrhea, unspecified: Secondary | ICD-10-CM | POA: Diagnosis not present

## 2016-07-04 DIAGNOSIS — O9989 Other specified diseases and conditions complicating pregnancy, childbirth and the puerperium: Secondary | ICD-10-CM

## 2016-07-04 LAB — CBC
HCT: 41.4 % (ref 36.0–46.0)
Hemoglobin: 13.5 g/dL (ref 12.0–15.0)
MCH: 29.1 pg (ref 26.0–34.0)
MCHC: 32.6 g/dL (ref 30.0–36.0)
MCV: 89.2 fL (ref 78.0–100.0)
Platelets: 294 K/uL (ref 150–400)
RBC: 4.64 MIL/uL (ref 3.87–5.11)
RDW: 13.8 % (ref 11.5–15.5)
WBC: 6.2 K/uL (ref 4.0–10.5)

## 2016-07-04 LAB — WET PREP, GENITAL
CLUE CELLS WET PREP: NONE SEEN
SPERM: NONE SEEN
Yeast Wet Prep HPF POC: NONE SEEN

## 2016-07-04 LAB — URINALYSIS, ROUTINE W REFLEX MICROSCOPIC
BILIRUBIN URINE: NEGATIVE
Bacteria, UA: NONE SEEN
GLUCOSE, UA: NEGATIVE mg/dL
Ketones, ur: NEGATIVE mg/dL
NITRITE: NEGATIVE
PH: 5 (ref 5.0–8.0)
Protein, ur: NEGATIVE mg/dL
SPECIFIC GRAVITY, URINE: 1.026 (ref 1.005–1.030)

## 2016-07-04 LAB — HCG, QUANTITATIVE, PREGNANCY: hCG, Beta Chain, Quant, S: 3 m[IU]/mL

## 2016-07-04 LAB — POCT PREGNANCY, URINE: Preg Test, Ur: NEGATIVE

## 2016-07-04 MED ORDER — METRONIDAZOLE 500 MG PO TABS
2000.0000 mg | ORAL_TABLET | Freq: Once | ORAL | Status: AC
  Administered 2016-07-04: 2000 mg via ORAL
  Filled 2016-07-04: qty 4

## 2016-07-04 NOTE — MAU Note (Signed)
Pt reports she had a miscarriage 8 weeks ago. Given "medicine to complete the miscarriage. Pt stated she still has been having vag bleeding since then it had never stopped. Bleeding is lighter than a period but still having cramping.

## 2016-07-04 NOTE — MAU Provider Note (Signed)
Chief Complaint:  Vaginal Bleeding   First Provider Initiated Contact with Patient 07/04/16 1446      HPI: Julie Cline is a 21 y.o. G2P1001 who presents to maternity admissions reporting vaginal bleeding after having a miscarriage.  Also has some cramping.  Miscarriage was completed with Cytotec. She reports vaginal bleeding, vaginal itching/burning, urinary symptoms, h/a, dizziness, n/v, or fever/chills.    Pain is actually in upper abdomen.  Colicky, comes and goes and moves around.  Lower abdominal cramping is mild.    Vaginal Bleeding  The patient's primary symptoms include genital itching, pelvic pain and vaginal bleeding. The patient's pertinent negatives include no genital lesions, genital odor or genital rash. This is a recurrent problem. The current episode started 1 to 4 weeks ago. The problem occurs intermittently. The problem has been gradually improving. The pain is mild. The problem affects both sides. She is not pregnant. Associated symptoms include abdominal pain, back pain and diarrhea (not really diarrhea, just 2 loose stools per day). Pertinent negatives include no chills, constipation, dysuria, fever, flank pain, nausea or vomiting. The vaginal discharge was bloody. The vaginal bleeding is lighter than menses. She has not been passing clots. She has not been passing tissue. Nothing aggravates the symptoms. She has tried nothing for the symptoms. She is sexually active. It is unknown (States partner got "checked last week and doesnt havie anything"  Does not know his name) whether or not her partner has an STD. She uses nothing for contraception.  Abdominal Pain  This is a recurrent problem. The current episode started 1 to 4 weeks ago. The onset quality is gradual. The problem occurs intermittently. The pain is located in the LUQ, RUQ and epigastric region. The pain is mild. The quality of the pain is colicky. The abdominal pain does not radiate. Associated symptoms include  diarrhea (not really diarrhea, just 2 loose stools per day). Pertinent negatives include no constipation, dysuria, fever, nausea or vomiting.    RN Note: Pt reports she had a miscarriage 8 weeks ago. Given "medicine to complete the miscarriage. Pt stated she still has been having vag bleeding since then it had never stopped. Bleeding is lighter than a period but still having cramping.    Electronically signed by Madaline Savage, RN at      Past Medical History: Past Medical History:  Diagnosis Date  . Chlamydia   . Medical history non-contributory   . Urinary tract infection     Past obstetric history: OB History  Gravida Para Term Preterm AB Living  2 1 1  0 0 1  SAB TAB Ectopic Multiple Live Births  0 0 0 0 1    # Outcome Date GA Lbr Len/2nd Weight Sex Delivery Anes PTL Lv  2 Gravida           1 Term 11/01/15 [redacted]w[redacted]d 05:34 / 00:15 6 lb 3.3 oz (2.815 kg) M Vag-Spont EPI  LIV      Past Surgical History: Past Surgical History:  Procedure Laterality Date  . EYE SURGERY     Eye lid  . HERNIA REPAIR      Family History: Family History  Problem Relation Age of Onset  . Diabetes Maternal Uncle   . Prostate cancer Paternal Grandfather   . Alcohol abuse Neg Hx   . Arthritis Neg Hx   . Asthma Neg Hx   . Birth defects Neg Hx   . Cancer Neg Hx   . COPD Neg Hx   .  Depression Neg Hx   . Drug abuse Neg Hx   . Early death Neg Hx   . Hearing loss Neg Hx   . Heart disease Neg Hx   . Hyperlipidemia Neg Hx   . Hypertension Neg Hx   . Kidney disease Neg Hx   . Learning disabilities Neg Hx   . Mental illness Neg Hx   . Mental retardation Neg Hx   . Miscarriages / Stillbirths Neg Hx   . Stroke Neg Hx   . Vision loss Neg Hx   . Varicose Veins Neg Hx     Social History: Social History  Substance Use Topics  . Smoking status: Former Smoker    Packs/day: 0.25    Years: 6.00    Types: Cigarettes    Quit date: 01/27/2013  . Smokeless tobacco: Never Used  . Alcohol use  No    Allergies: No Known Allergies  Meds:  Prescriptions Prior to Admission  Medication Sig Dispense Refill Last Dose  . HYDROcodone-acetaminophen (NORCO/VICODIN) 5-325 MG tablet Take 1-2 tablets by mouth every 4 (four) hours as needed. 6 tablet 0   . ibuprofen (ADVIL,MOTRIN) 600 MG tablet Take 1 tablet (600 mg total) by mouth every 6 (six) hours as needed. 30 tablet 0   . misoprostol (CYTOTEC) 200 MCG tablet Place 4 tablets (800 mcg total) vaginally once. 4 tablet 0   . prochlorperazine (COMPAZINE) 10 MG tablet Take 1 tablet (10 mg total) by mouth 2 (two) times daily as needed for nausea or vomiting. 8 tablet 0     I have reviewed patient's Past Medical Hx, Surgical Hx, Family Hx, Social Hx, medications and allergies.  ROS:  Review of Systems  Constitutional: Negative for chills and fever.  Gastrointestinal: Positive for abdominal pain and diarrhea (not really diarrhea, just 2 loose stools per day). Negative for constipation, nausea and vomiting.  Genitourinary: Positive for pelvic pain and vaginal bleeding. Negative for dysuria and flank pain.  Musculoskeletal: Positive for back pain.   Other systems negative     Physical Exam  Patient Vitals for the past 24 hrs:  BP Temp Pulse Resp SpO2 Height Weight  07/04/16 1426 126/75 98.2 F (36.8 C) 63 18 100 % 5\' 6"  (1.676 m) 130 lb 12.8 oz (59.3 kg)   Constitutional: Well-developed, well-nourished female in no acute distress.  Cardiovascular: normal rate and rhythm, no ectopy audible, S1 & S2 heard, no murmur Respiratory: normal effort, no distress. Lungs CTAB with no wheezes or crackles GI: Abd soft, non-tender.  Nondistended.  No rebound, No guarding.  Bowel Sounds audible  MS: Extremities nontender, no edema, normal ROM Neurologic: Alert and oriented x 4.   Grossly nonfocal. GU: Neg CVAT. Skin:  Warm and Dry Psych:  Affect appropriate.  PELVIC EXAM: Cervix pink, visually closed, without lesion, scant red discharge, vaginal  walls and external genitalia normal Bimanual exam: Cervix firm, anterior, neg CMT, uterus slightly tender, nonenlarged, adnexa without tenderness, enlargement, or mass    Labs: A/Positive/-- (04/03 1605) Results for orders placed or performed during the hospital encounter of 07/04/16 (from the past 24 hour(s))  Urinalysis, Routine w reflex microscopic     Status: Abnormal   Collection Time: 07/04/16  2:30 PM  Result Value Ref Range   Color, Urine YELLOW YELLOW   APPearance CLEAR CLEAR   Specific Gravity, Urine 1.026 1.005 - 1.030   pH 5.0 5.0 - 8.0   Glucose, UA NEGATIVE NEGATIVE mg/dL   Hgb urine dipstick MODERATE (A) NEGATIVE  Bilirubin Urine NEGATIVE NEGATIVE   Ketones, ur NEGATIVE NEGATIVE mg/dL   Protein, ur NEGATIVE NEGATIVE mg/dL   Nitrite NEGATIVE NEGATIVE   Leukocytes, UA SMALL (A) NEGATIVE   RBC / HPF 6-30 0 - 5 RBC/hpf   WBC, UA 6-30 0 - 5 WBC/hpf   Bacteria, UA NONE SEEN NONE SEEN   Squamous Epithelial / LPF 0-5 (A) NONE SEEN   Mucous PRESENT   Pregnancy, urine POC     Status: None   Collection Time: 07/04/16  2:50 PM  Result Value Ref Range   Preg Test, Ur NEGATIVE NEGATIVE  Wet prep, genital     Status: Abnormal   Collection Time: 07/04/16  2:58 PM  Result Value Ref Range   Yeast Wet Prep HPF POC NONE SEEN NONE SEEN   Trich, Wet Prep PRESENT (A) NONE SEEN   Clue Cells Wet Prep HPF POC NONE SEEN NONE SEEN   WBC, Wet Prep HPF POC MANY (A) NONE SEEN   Sperm NONE SEEN   CBC     Status: None   Collection Time: 07/04/16  3:02 PM  Result Value Ref Range   WBC 6.2 4.0 - 10.5 K/uL   RBC 4.64 3.87 - 5.11 MIL/uL   Hemoglobin 13.5 12.0 - 15.0 g/dL   HCT 96.241.4 95.236.0 - 84.146.0 %   MCV 89.2 78.0 - 100.0 fL   MCH 29.1 26.0 - 34.0 pg   MCHC 32.6 30.0 - 36.0 g/dL   RDW 32.413.8 40.111.5 - 02.715.5 %   Platelets 294 150 - 400 K/uL  hCG, quantitative, pregnancy     Status: None   Collection Time: 07/04/16  3:02 PM  Result Value Ref Range   hCG, Beta Chain, Quant, S 3 <5 mIU/mL     Imaging:   MAU Course/MDM: I have ordered labs as follows:  Wet prep, GC/Chlam (per request), Quant HCG, CBC Imaging ordered: none Results reviewed. HCG is less than 5, so AB is completed.  CBC stable. No leukocytosis to suggest infection.  Wet prep is positive for Trich, which was treated here today.    Given location and type of colicky upper abdominal pain that moves around, I think the abdominal pain is likely intestinal colick, possibly IBS. Patient states she has wondered about that.  No acute findings for endometritis.   Treatments in MAU included Flagyl 2gm PO x 1.   Partner Rx given for Expedited Partner Therapy  Discussed contraception, patient wants to start it right away.  I got her an appointment for tomorrow at 0820 for DepoProvera, which she wants.  Pt stable at time of discharge.  Assessment: Abdominal cramping - Plan: Discharge patient  Trichomonal vaginitis - Plan: Discharge patient  Loose stools - 2/day - Plan: Discharge patient  Complete abortion - Plan: Discharge patient   Plan: Discharge home Recommend Followup in clinic as scheduled  Encouraged to return here or to other Urgent Care/ED if she develops worsening of symptoms, increase in pain, fever, or other concerning symptoms.   Wynelle BourgeoisMarie Caral Whan CNM, MSN Certified Nurse-Midwife 07/04/2016 2:46 PM

## 2016-07-04 NOTE — Discharge Instructions (Signed)
Abdominal Bloating When you have abdominal bloating, your abdomen may feel full, tight, or painful. It may also look bigger than normal or swollen (distended). Common causes of abdominal bloating include:  Swallowing air.  Constipation.  Problems digesting food.  Eating too much.  Irritable bowel syndrome. This is a condition that affects the large intestine.  Lactose intolerance. This is an inability to digest lactose, a natural sugar in dairy products.  Celiac disease. This is a condition that affects the ability to digest gluten, a protein found in some grains.  Gastroparesis. This is a condition that slows down the movement of food in the stomach and small intestine. It is more common in people with diabetes mellitus.  Gastroesophageal reflux disease (GERD). This is a digestive condition that makes stomach acid flow back into the esophagus.  Urinary retention. This means that the body is holding onto urine, and the bladder cannot be emptied all the way.  Follow these instructions at home: Eating and drinking  Avoid eating too much.  Try not to swallow air while talking or eating.  Avoid eating while lying down.  Avoid these foods and drinks: ? Foods that cause gas, such as broccoli, cabbage, cauliflower, and baked beans. ? Carbonated drinks. ? Hard candy. ? Chewing gum. Medicines  Take over-the-counter and prescription medicines only as told by your health care provider.  Take probiotic medicines. These medicines contain live bacteria or yeasts that can help digestion.  Take coated peppermint oil capsules. Activity  Try to exercise regularly. Exercise may help to relieve bloating that is caused by gas and relieve constipation. General instructions  Keep all follow-up visits as told by your health care provider. This is important. Contact a health care provider if:  You have nausea and vomiting.  You have diarrhea.  You have abdominal pain.  You have  unusual weight loss or weight gain.  You have severe pain, and medicines do not help. Get help right away if:  You have severe chest pain.  You have trouble breathing.  You have shortness of breath.  You have trouble urinating.  You have darker urine than normal.  You have blood in your stools or have dark, tarry stools. Summary  Abdominal bloating means that the abdomen is swollen.  Common causes of abdominal bloating are swallowing air, constipation, and problems digesting food.  Avoid eating too much and avoid swallowing air.  Avoid foods that cause gas, carbonated drinks, hard candy, and chewing gum. This information is not intended to replace advice given to you by your health care provider. Make sure you discuss any questions you have with your health care provider. Document Released: 02/16/2016 Document Revised: 02/16/2016 Document Reviewed: 02/16/2016 Elsevier Interactive Patient Education  2018 [employer]  Trichomoniasis Trichomoniasis is an STI (sexually transmitted infection) that can affect both women and men. In women, the outer area of the female genitalia (vulva) and the vagina are affected. In men, the penis is mainly affected, but the prostate and other reproductive organs can also be involved. This condition can be treated with medicine. It often has no symptoms (is asymptomatic), especially in men. What are the causes? This condition is caused by an organism called Trichomonas vaginalis. Trichomoniasis most often spreads from person to person (is contagious) through sexual contact. What increases the risk? The following factors may make you more likely to develop this condition:  Having unprotected sexual intercourse.  Having sexual intercourse with a partner who has trichomoniasis.  Having multiple sexual partners.  Having had previous trichomoniasis infections or other STIs.  What are the signs or symptoms? In women, symptoms of trichomoniasis  include:  Abnormal vaginal discharge that is clear, white, gray, or yellow-green and foamy and has an unusual "fishy" odor.  Itching and irritation of the vagina and vulva.  Burning or pain during urination or sexual intercourse.  Genital redness and swelling.  In men, symptoms of trichomoniasis include:  Penile discharge that may be foamy or contain pus.  Pain in the penis. This may happen only when urinating.  Itching or irritation inside the penis.  Burning after urination or ejaculation.  How is this diagnosed? In women, this condition may be found during a routine Pap test or physical exam. It may be found in men during a routine physical exam. Your health care provider may perform tests to help diagnose this infection, such as:  Urine tests (men and women).  The following in women: ? Testing the pH of the vagina. ? A vaginal swab test that checks for the Trichomonas vaginalis organism. ? Testing vaginal secretions.  Your health care provider may test you for other STIs, including HIV (human immunodeficiency virus). How is this treated? This condition is treated with medicine taken by mouth (orally), such as metronidazole or tinidazole to fight the infection. Your sexual partner(s) may also need to be tested and treated.  If you are a woman and you plan to become pregnant or think you may be pregnant, tell your health care provider right away. Some medicines that are used to treat the infection should not be taken during pregnancy.  Your health care provider may recommend over-the-counter medicines or creams to help relieve itching or irritation. You may be tested for infection again 3 months after treatment. Follow these instructions at home:  Take and use over-the-counter and prescription medicines, including creams, only as told by your health care provider.  Do not have sexual intercourse until one week after you finish your medicine, or until your health care  provider approves. Ask your health care provider when you may resume sexual intercourse.  (Women) Do not douche or wear tampons while you have the infection.  Discuss your infection with your sexual partner(s). Make sure that your partner gets tested and treated, if necessary.  Keep all follow-up visits as told by your health care provider. This is important. How is this prevented?  Use condoms every time you have sex. Using condoms correctly and consistently can help protect against STIs.  Avoid having multiple sexual partners.  Talk with your sexual partner about any symptoms that either of you may have, as well as any history of STIs.  Get tested for STIs and STDs (sexually transmitted diseases) before you have sex. Ask your partner to do the same.  Do not have sexual contact if you have symptoms of trichomoniasis or another STI. Contact a health care provider if:  You still have symptoms after you finish your medicine.  You develop pain in your abdomen.  You have pain when you urinate.  You have bleeding after sexual intercourse.  You develop a rash.  You feel nauseous or you vomit.  You plan to become pregnant or think you may be pregnant. Summary  Trichomoniasis is an STI (sexually transmitted infection) that can affect both women and men.  This condition often has no symptoms (is asymptomatic), especially in men.  You should not have sexual intercourse until one week after you finish your medicine, or until your health care  provider approves. Ask your health care provider when you may resume sexual intercourse.  Discuss your infection with your sexual partner. Make sure that your partner gets tested and treated, if necessary. This information is not intended to replace advice given to you by your health care provider. Make sure you discuss any questions you have with your health care provider. Document Released: 07/10/2000 Document Revised: 12/08/2015 Document  Reviewed: 12/08/2015 Elsevier Interactive Patient Education  2017 [employer].

## 2016-07-05 ENCOUNTER — Telehealth (HOSPITAL_COMMUNITY): Payer: Self-pay

## 2016-07-05 ENCOUNTER — Encounter (HOSPITAL_COMMUNITY): Payer: Self-pay | Admitting: Advanced Practice Midwife

## 2016-07-05 ENCOUNTER — Ambulatory Visit (INDEPENDENT_AMBULATORY_CARE_PROVIDER_SITE_OTHER): Payer: Medicaid Other

## 2016-07-05 VITALS — BP 120/66 | HR 72

## 2016-07-05 DIAGNOSIS — A549 Gonococcal infection, unspecified: Secondary | ICD-10-CM | POA: Insufficient documentation

## 2016-07-05 DIAGNOSIS — Z3042 Encounter for surveillance of injectable contraceptive: Secondary | ICD-10-CM

## 2016-07-05 DIAGNOSIS — Z3049 Encounter for surveillance of other contraceptives: Secondary | ICD-10-CM | POA: Diagnosis present

## 2016-07-05 HISTORY — DX: Gonococcal infection, unspecified: A54.9

## 2016-07-05 LAB — GC/CHLAMYDIA PROBE AMP (~~LOC~~) NOT AT ARMC
Chlamydia: NEGATIVE
Neisseria Gonorrhea: POSITIVE — AB

## 2016-07-05 MED ORDER — MEDROXYPROGESTERONE ACETATE 150 MG/ML IM SUSP
150.0000 mg | Freq: Once | INTRAMUSCULAR | Status: AC
  Administered 2016-07-05: 150 mg via INTRAMUSCULAR

## 2016-07-05 NOTE — Progress Notes (Signed)
Patient presented to office today for her first depo provera.Patient was told to come down from [facility]U after having a miscarriage.  Patient will returned 09/20/2016-10/04/2016

## 2016-07-05 NOTE — Telephone Encounter (Signed)
Called patient. Verified DOB with patient. Pt aware her lab results during her last visit to [facility] showed she was positive for Gonorrhea. Pt aware to contact her the local Health Clinic in the county she resides to be seen for treatment of her positive results. Patient aware once treatment is received, abstain from intercourse x 2 weeks. Also, she is aware to inform her partner because they will also need to be treated. Communicable Disease Report faxed to health department. 

## 2016-09-20 ENCOUNTER — Ambulatory Visit: Payer: Medicaid Other

## 2016-12-29 ENCOUNTER — Encounter (HOSPITAL_COMMUNITY): Payer: Self-pay | Admitting: *Deleted

## 2016-12-29 ENCOUNTER — Other Ambulatory Visit: Payer: Self-pay

## 2016-12-29 ENCOUNTER — Emergency Department (HOSPITAL_COMMUNITY)
Admission: EM | Admit: 2016-12-29 | Discharge: 2016-12-29 | Disposition: A | Discharge status: Home / Self Care | Payer: Self-pay | Attending: Emergency Medicine | Admitting: Emergency Medicine

## 2016-12-29 DIAGNOSIS — Y33XXXA Other specified events, undetermined intent, initial encounter: Secondary | ICD-10-CM | POA: Insufficient documentation

## 2016-12-29 DIAGNOSIS — S025XXA Fracture of tooth (traumatic), initial encounter for closed fracture: Secondary | ICD-10-CM | POA: Insufficient documentation

## 2016-12-29 DIAGNOSIS — Z87891 Personal history of nicotine dependence: Secondary | ICD-10-CM | POA: Insufficient documentation

## 2016-12-29 DIAGNOSIS — Y939 Activity, unspecified: Secondary | ICD-10-CM | POA: Insufficient documentation

## 2016-12-29 DIAGNOSIS — Y999 Unspecified external cause status: Secondary | ICD-10-CM | POA: Insufficient documentation

## 2016-12-29 DIAGNOSIS — Y929 Unspecified place or not applicable: Secondary | ICD-10-CM | POA: Insufficient documentation

## 2016-12-29 DIAGNOSIS — K0889 Other specified disorders of teeth and supporting structures: Secondary | ICD-10-CM

## 2016-12-29 MED ORDER — HYDROCODONE-ACETAMINOPHEN 5-325 MG PO TABS
2.0000 | ORAL_TABLET | Freq: Once | ORAL | Status: AC
  Administered 2016-12-29: 2 via ORAL
  Filled 2016-12-29: qty 2

## 2016-12-29 MED ORDER — HYDROCODONE-ACETAMINOPHEN 5-325 MG PO TABS: 1.0000 | ORAL_TABLET | Freq: Four times a day (QID) | ORAL | 0 refills | Status: DC | PRN

## 2016-12-29 MED ORDER — AMOXICILLIN 500 MG PO CAPS: 500.0000 mg | ORAL_CAPSULE | Freq: Two times a day (BID) | ORAL | 0 refills | Status: DC

## 2016-12-29 NOTE — ED Provider Notes (Signed)
[facility] EM[facility]GENCY [facility] Provider Note   CSN: [id number] Arrival date & time: 12/29/16  1254     History   Chief Complaint Chief Complaint  Patient presents with  . Dental Pain    HPI Julie Cline is a 21 y.o. female who presents today for evaluation of left lower dental pain.  Patient reports that this is been ongoing for the last 2 days.  Patient reports that she thinks her lower tooth chipped off and is causing the pain.  Patient has been taking Tylenol and ibuprofen with minimal improvement in pain.  She has not tried any other therapies.  Patient does not currently see a dentist.  She states that she called but the earliest appointment that was able to to get was tomorrow.  Patient reports that she has been able to eat and drink but reports worsening pain.  Patient denies any fevers, difficulty swelling, facial redness or swelling, neck swelling.  The history is provided by the patient.    Past Medical History:  Diagnosis Date  . Chlamydia   . Medical history non-contributory   . Urinary tract infection     Patient Active Problem List   Diagnosis Date Noted  . Gonorrhea 07/05/2016  . Trichomonal vaginitis 07/05/2016  . Supervision of normal pregnancy 04/30/2016  . History of placenta abruption 04/30/2016    Past Surgical History:  Procedure Laterality Date  . EYE SURGERY     Eye lid  . HERNIA REPAIR      OB History    Gravida Para Term Preterm AB Living   2 1 1  0 0 1   SAB TAB Ectopic Multiple Live Births   0 0 0 0 1       Home Medications    Prior to Admission medications   Medication Sig Start Date End Date Taking? Authorizing Provider  amoxicillin (AMOXIL) 500 MG capsule Take 1 capsule (500 mg total) by mouth 2 (two) times daily. 12/29/16   Maxwell CaulLayden, Donald Jacque A, PA-C  HYDROcodone-acetaminophen (NORCO/VICODIN) 5-325 MG tablet Take 1-2 tablets by mouth every 6 (six) hours as needed. 12/29/16   Maxwell CaulLayden, Ramondo Dietze A, PA-C    ibuprofen (ADVIL,MOTRIN) 600 MG tablet Take 1 tablet (600 mg total) by mouth every 6 (six) hours as needed. 04/30/16   Rasch, Victorino DikeJennifer I, NP  misoprostol (CYTOTEC) 200 MCG tablet Place 4 tablets (800 mcg total) vaginally once. 04/30/16 04/30/16  Rasch, Harolyn RutherfordJennifer I, NP    Family History Family History  Problem Relation Age of Onset  . Diabetes Maternal Uncle   . Prostate cancer Paternal Grandfather   . Alcohol abuse Neg Hx   . Arthritis Neg Hx   . Asthma Neg Hx   . Birth defects Neg Hx   . Cancer Neg Hx   . COPD Neg Hx   . Depression Neg Hx   . Drug abuse Neg Hx   . Early death Neg Hx   . Hearing loss Neg Hx   . Heart disease Neg Hx   . Hyperlipidemia Neg Hx   . Hypertension Neg Hx   . Kidney disease Neg Hx   . Learning disabilities Neg Hx   . Mental illness Neg Hx   . Mental retardation Neg Hx   . Miscarriages / Stillbirths Neg Hx   . Stroke Neg Hx   . Vision loss Neg Hx   . Varicose Veins Neg Hx     Social History Social History   Tobacco Use  .  Smoking status: Former Smoker    Packs/day: 0.25    Years: 6.00    Pack years: 1.50    Types: Cigarettes    Last attempt to quit: 01/27/2013    Years since quitting: 3.9  . Smokeless tobacco: Never Used  Substance Use Topics  . Alcohol use: No  . Drug use: No     Allergies   Patient has no known allergies.   Review of Systems Review of Systems  Constitutional: Negative for fever.  HENT: Positive for dental problem. Negative for facial swelling and trouble swallowing.      Physical Exam Updated Vital Signs BP 132/84   Pulse 68   Temp 98.8 F (37.1 C) (Oral)   Resp 12   Ht 5\' 6"  (1.676 m)   Wt 61.2 kg (135 lb)   LMP 12/29/2016 (Exact Date)   SpO2 98%   BMI 21.79 kg/m   Physical Exam  Constitutional: She appears well-developed and well-nourished.  HENT:  Head: Normocephalic and atraumatic.  Mouth/Throat: Uvula is midline, oropharynx is clear and moist and mucous membranes are normal. No trismus in the  jaw. Abnormal dentition. No dental abscesses.    Scattered dental caries noted.  No gingival swelling or erythema.  No fluctuance or dental abscess identified.  No trismus.  Uvula is midline.  No evidence of peritonsillar abscess.  Posterior oropharynx is clear.  Eyes: Conjunctivae and EOM are normal. Right eye exhibits no discharge. Left eye exhibits no discharge. No scleral icterus.  Pulmonary/Chest: Effort normal.  Neurological: She is alert.  Skin: Skin is warm and dry.  Psychiatric: She has a normal mood and affect. Her speech is normal and behavior is normal.  Nursing note and vitals reviewed.    ED Treatments / Results  Labs (all labs ordered are listed, but only abnormal results are displayed) Labs Reviewed - No data to display  EKG  EKG Interpretation None       Radiology No results found.  Procedures Procedures (including critical care time)  Medications Ordered in ED Medications  HYDROcodone-acetaminophen (NORCO/VICODIN) 5-325 MG per tablet 2 tablet (2 tablets Oral Given 12/29/16 1553)     Initial Impression / Assessment and Plan / ED Course  I have reviewed the triage vital signs and the nursing notes.  Pertinent labs & imaging results that were available during my care of the patient were reviewed by me and considered in my medical decision making (see chart for details).     20 y.o. F who presents to the ED for evaluation of dental pain.  Patient reports that 1 of her tooth has partially cracked, causing the pain.  Denies any fevers, facial swelling, facial redness. Patient is afebrile , non-toxic appearing, sitting comfortably on examination table. Vital signs reviewed and stable.  Physical exam shows partially cracked to the left lower side.   No trismus.  Uvula is midline.  Patient no evidence of respiratory distress.  No identifiable dental abscess that needs I&D today in the department.  Suspect that symptoms are likely secondary to cracked tooth.   Requesting a "24-hour nerve block.." Explained to patient that I could attempt to have a dental block but states that it will not last for 24 hours.  To give a dose of pain medication here in the department since her mom is here to drive her home.  Patient does not wish to have nerve block done at this time.  We will plan to give a very small dose of pain  medication until she is ableto see the dentist tomorrow.  Start on antibiotic therapy for prevention of dental abscess.  Instructed patient to follow-up with referred dental resources. Patient had ample opportunity for questions and discussion. All patient's questions were answered with full understanding. Strict return precautions discussed. Patient expresses understanding and agreement to plan.   Final Clinical Impressions(s) / ED Diagnoses   Final diagnoses:  Pain, dental  Closed fracture of tooth, initial encounter    ED Discharge Orders        Ordered    amoxicillin (AMOXIL) 500 MG capsule  2 times daily     12/29/16 1542    HYDROcodone-acetaminophen (NORCO/VICODIN) 5-325 MG tablet  Every 6 hours PRN     12/29/16 1549       Maxwell CaulLayden, Joycelynn Fritsche A, PA-C 12/29/16 1728    Maia PlanLong, Joshua G, MD 12/30/16 [phone]

## 2016-12-29 NOTE — ED Triage Notes (Signed)
Pt reports having pain from broken tooth on LT lower side.

## 2016-12-29 NOTE — Discharge Instructions (Signed)
Take the medications as directed.   You can take Tylenol or Ibuprofen as directed for pain. You can alternate Tylenol and Ibuprofen every 4 hours. If you take Tylenol at 1pm, then you can take Ibuprofen at 5pm. Then you can take Tylenol again at 9pm. You can take the pain medication for severe pain. Do not breast feed while taking this medication.   The exam and treatment you received today has been provided on an emergency basis only. This is not a substitute for complete medical or dental care. If your problem worsens or new symptoms (problems) appear, and you are unable to arrange prompt follow-up care with your dentist, call or return to this location. If you do not have a dentist, please follow-up with one on the list provided  CALL YOUR DENTIST OR RETURN IMMEDIATELY IF you develop a fever, rash, difficulty breathing or swallowing, neck or facial swelling, or other potentially serious concerns.

## 2017-01-25 ENCOUNTER — Inpatient Hospital Stay (HOSPITAL_COMMUNITY)
Admission: AD | Admit: 2017-01-25 | Discharge: 2017-01-25 | Disposition: A | Discharge status: Home / Self Care | Payer: Self-pay | Source: Ambulatory Visit | Attending: Obstetrics and Gynecology | Admitting: Obstetrics and Gynecology

## 2017-01-25 ENCOUNTER — Other Ambulatory Visit: Payer: Self-pay

## 2017-01-25 ENCOUNTER — Encounter (HOSPITAL_COMMUNITY): Payer: Self-pay | Admitting: *Deleted

## 2017-01-25 DIAGNOSIS — Z9889 Other specified postprocedural states: Secondary | ICD-10-CM | POA: Insufficient documentation

## 2017-01-25 DIAGNOSIS — N760 Acute vaginitis: Secondary | ICD-10-CM

## 2017-01-25 DIAGNOSIS — N898 Other specified noninflammatory disorders of vagina: Secondary | ICD-10-CM

## 2017-01-25 DIAGNOSIS — B9689 Other specified bacterial agents as the cause of diseases classified elsewhere: Secondary | ICD-10-CM

## 2017-01-25 DIAGNOSIS — Z87440 Personal history of urinary (tract) infections: Secondary | ICD-10-CM | POA: Insufficient documentation

## 2017-01-25 DIAGNOSIS — Z833 Family history of diabetes mellitus: Secondary | ICD-10-CM | POA: Insufficient documentation

## 2017-01-25 DIAGNOSIS — N938 Other specified abnormal uterine and vaginal bleeding: Secondary | ICD-10-CM

## 2017-01-25 DIAGNOSIS — Z87891 Personal history of nicotine dependence: Secondary | ICD-10-CM | POA: Insufficient documentation

## 2017-01-25 DIAGNOSIS — Z8042 Family history of malignant neoplasm of prostate: Secondary | ICD-10-CM | POA: Insufficient documentation

## 2017-01-25 DIAGNOSIS — Z3202 Encounter for pregnancy test, result negative: Secondary | ICD-10-CM | POA: Insufficient documentation

## 2017-01-25 DIAGNOSIS — N939 Abnormal uterine and vaginal bleeding, unspecified: Secondary | ICD-10-CM | POA: Insufficient documentation

## 2017-01-25 DIAGNOSIS — Z8619 Personal history of other infectious and parasitic diseases: Secondary | ICD-10-CM | POA: Insufficient documentation

## 2017-01-25 LAB — URINALYSIS, ROUTINE W REFLEX MICROSCOPIC
BACTERIA UA: NONE SEEN
Bilirubin Urine: NEGATIVE
Glucose, UA: NEGATIVE mg/dL
KETONES UR: NEGATIVE mg/dL
Leukocytes, UA: NEGATIVE
Nitrite: NEGATIVE
PROTEIN: 30 mg/dL — AB
Specific Gravity, Urine: 1.025 (ref 1.005–1.030)
pH: 5 (ref 5.0–8.0)

## 2017-01-25 LAB — WET PREP, GENITAL
Sperm: NONE SEEN
TRICH WET PREP: NONE SEEN
YEAST WET PREP: NONE SEEN

## 2017-01-25 LAB — POCT PREGNANCY, URINE: Preg Test, Ur: NEGATIVE

## 2017-01-25 MED ORDER — MEGESTROL ACETATE 40 MG PO TABS: 40.0000 mg | ORAL_TABLET | Freq: Three times a day (TID) | ORAL | 0 refills | Status: DC

## 2017-01-25 MED ORDER — METRONIDAZOLE 500 MG PO TABS: 500.0000 mg | ORAL_TABLET | Freq: Two times a day (BID) | ORAL | 0 refills | Status: DC

## 2017-01-25 NOTE — Discharge Instructions (Signed)
Abnormal Uterine Bleeding Abnormal uterine bleeding means bleeding more than usual from your uterus. It can include:  Bleeding between periods.  Bleeding after sex.  Bleeding that is heavier than normal.  Periods that last longer than usual.  Bleeding after you have stopped having your period (menopause).  There are many problems that may cause this. You should see a doctor for any kind of bleeding that is not normal. Treatment depends on the cause of the bleeding. Follow these instructions at home:  Watch your condition for any changes.  Do not use tampons, douche, or have sex, if your doctor tells you not to.  Change your pads often.  Get regular well-woman exams. Make sure they include a pelvic exam and cervical cancer screening.  Keep all follow-up visits as told by your doctor. This is important. Contact a doctor if:  The bleeding lasts more than one week.  You feel dizzy at times.  You feel like you are going to throw up (nauseous).  You throw up. Get help right away if:  You pass out.  You have to change pads every hour.  You have belly (abdominal) pain.  You have a fever.  You get sweaty.  You get weak.  You passing large blood clots from your vagina. Summary  Abnormal uterine bleeding means bleeding more than usual from your uterus.  There are many problems that may cause this. You should see a doctor for any kind of bleeding that is not normal.  Treatment depends on the cause of the bleeding. This information is not intended to replace advice given to you by your health care provider. Make sure you discuss any questions you have with your health care provider. Document Released: 11/11/2008 Document Revised: 01/09/2016 Document Reviewed: 01/09/2016 Elsevier Interactive Patient Education  2017 Elsevier Inc.  Bacterial Vaginosis Bacterial vaginosis is an infection of the vagina. It happens when too many germs (bacteria) grow in the vagina. This  infection puts you at risk for infections from sex (STIs). Treating this infection can lower your risk for some STIs. You should also treat this if you are pregnant. It can cause your baby to be born early. Follow these instructions at home: Medicines  Take over-the-counter and prescription medicines only as told by your doctor.  Take or use your antibiotic medicine as told by your doctor. Do not stop taking or using it even if you start to feel better. General instructions  If you your sexual partner is a woman, tell her that you have this infection. She needs to get treatment if she has symptoms. If you have a female partner, he does not need to be treated.  During treatment: ? Avoid sex. ? Do not douche. ? Avoid alcohol as told. ? Avoid breastfeeding as told.  Drink enough fluid to keep your pee (urine) clear or pale yellow.  Keep your vagina and butt (rectum) clean. ? Wash the area with warm water every day. ? Wipe from front to back after you use the toilet.  Keep all follow-up visits as told by your doctor. This is important. Preventing this condition  Do not douche.  Use only warm water to wash around your vagina.  Use protection when you have sex. This includes: ? Latex condoms. ? Dental dams.  Limit how many people you have sex with. It is best to only have sex with the same person (be monogamous).  Get tested for STIs. Have your partner get tested.  Wear underwear that is  cotton or lined with cotton.  Avoid tight pants and pantyhose. This is most important in summer.  Do not use any products that have nicotine or tobacco in them. These include cigarettes and e-cigarettes. If you need help quitting, ask your doctor.  Do not use illegal drugs.  Limit how much alcohol you drink. Contact a doctor if:  Your symptoms do not get better, even after you are treated.  You have more discharge or pain when you pee (urinate).  You have a fever.  You have pain in your  belly (abdomen).  You have pain with sex.  Your bleed from your vagina between periods. Summary  This infection happens when too many germs (bacteria) grow in the vagina.  Treating this condition can lower your risk for some infections from sex (STIs).  You should also treat this if you are pregnant. It can cause early (premature) birth.  Do not stop taking or using your antibiotic medicine even if you start to feel better. This information is not intended to replace advice given to you by your health care provider. Make sure you discuss any questions you have with your health care provider. Document Released: 10/24/2007 Document Revised: 09/30/2015 Document Reviewed: 09/30/2015 Elsevier Interactive Patient Education  2017 [employer]vier Inc. Backward    Walk backwards with eyes open. Take even steps, making sure each foot lifts off floor. Repeat for ____ minutes per session. Do ____ sessions per day. Repeat on compliant surface: ________.  Copyright  VHI. All rights reserved.

## 2017-01-25 NOTE — [facility] Note (Addendum)
Had a miscarriage in April.  Bleeding never stopped, about a month later was put on birth control, still continued to bleeding.  Stopped the birth control about 2 months ago. Bleeding finally stopped ? Mid Oct. 2 wks later started again.  Had a period 3 times this month. Bleeding now, has a smell

## 2017-01-25 NOTE — MAU Provider Note (Signed)
History   [age]  Female not pregnant in with vaginal bleeding off and on for 3 months. States had DMPA in may did not go for second shot in sept and since that time has had off and on vaginal bleeding. Also states has vag odor.  CSN: [id number]  Arrival date & time 01/25/17  1100   None     Chief Complaint  Patient presents with  . Vaginal Bleeding    HPI  Past Medical History:  Diagnosis Date  . Chlamydia   . Medical history non-contributory   . Urinary tract infection     Past Surgical History:  Procedure Laterality Date  . EYE SURGERY     Eye lid  . HERNIA REPAIR      Family History  Problem Relation Age of Onset  . Diabetes Maternal Uncle   . Prostate cancer Paternal Grandfather   . Alcohol abuse Neg Hx   . Arthritis Neg Hx   . Asthma Neg Hx   . Birth defects Neg Hx   . Cancer Neg Hx   . COPD Neg Hx   . Depression Neg Hx   . Drug abuse Neg Hx   . Early death Neg Hx   . Hearing loss Neg Hx   . Heart disease Neg Hx   . Hyperlipidemia Neg Hx   . Hypertension Neg Hx   . Kidney disease Neg Hx   . Learning disabilities Neg Hx   . Mental illness Neg Hx   . Mental retardation Neg Hx   . Miscarriages / Stillbirths Neg Hx   . Stroke Neg Hx   . Vision loss Neg Hx   . Varicose Veins Neg Hx     Social History   Tobacco Use  . Smoking status: Former Smoker    Packs/day: 0.25    Years: 6.00    Pack years: 1.50    Types: Cigarettes    Last attempt to quit: 01/27/2013    Years since quitting: 3.9  . Smokeless tobacco: Never Used  Substance Use Topics  . Alcohol use: No  . Drug use: Yes    Frequency: 7.0 times per week    Types: Marijuana    Comment: daily    OB History    Gravida Para Term Preterm AB Living   2 1 1  0 1 1   SAB TAB Ectopic Multiple Live Births   1 0 0 0 1      Review of Systems  Constitutional: Negative.   HENT: Negative.   Eyes: Negative.   Respiratory: Negative.   Cardiovascular: Negative.   Gastrointestinal: Negative.    Endocrine: Negative.   Genitourinary: Positive for vaginal bleeding.  Musculoskeletal: Negative.   Skin: Negative.   Allergic/Immunologic: Negative.   Neurological: Negative.   Hematological: Negative.   Psychiatric/Behavioral: Negative.     Allergies  Patient has no known allergies.  Home Medications    BP 110/76 (BP Location: Right Arm)   Pulse 65   Temp 98 F (36.7 C) (Oral)   Resp 16   Wt 129 lb 8 oz (58.7 kg)   LMP 01/22/2017   SpO2 99%   BMI 20.90 kg/m   Physical Exam  Constitutional: She is oriented to person, place, and time. She appears well-developed and well-nourished.  HENT:  Head: Normocephalic.  Eyes: Pupils are equal, round, and reactive to light.  Neck: Normal range of motion.  Cardiovascular: Normal rate, regular rhythm, normal heart sounds and intact distal pulses.  Pulmonary/Chest:  Effort normal and breath sounds normal.  Abdominal: Soft. Bowel sounds are normal.  Genitourinary: Vagina normal and uterus normal.  Musculoskeletal: Normal range of motion.  Neurological: She is alert and oriented to person, place, and time. She has normal reflexes.  Skin: Skin is warm and dry.  Psychiatric: She has a normal mood and affect. Her behavior is normal. Judgment and thought content normal.    MAU Course  Procedures (including critical care time)  Labs Reviewed  URINALYSIS, ROUTINE W REFLEX MICROSCOPIC - Abnormal; Notable for the following components:      Result Value   APPearance HAZY (*)    Hgb urine dipstick LARGE (*)    Protein, ur 30 (*)    Squamous Epithelial / LPF 0-5 (*)    All other components within normal limits  WET PREP, GENITAL  POCT PREGNANCY, URINE  GC/CHLAM[facility]DIA PROBE AMP ([place]) NOT [facility]   No results found.   1. DUB (dysfunctional uterine bleeding)       MDM  VSS, Sterile spec exam, sm amt dark vag bleeding noted. Wet prep pos clue, and cultures obtained. Treatment options discussed with pt, she refuses OC's,  discussed the fact she is non compliant with DMPA and that is cause of her bleeding. Will treat BV with flagyl, will treat DUB with megace for 10 days and pt is to follow up with her OB-GYN for further treatment.

## 2017-01-27 LAB — GC/CHLAMYDIA PROBE AMP (~~LOC~~) NOT AT ARMC
CHLAMYDIA, DNA PROBE: NEGATIVE
Neisseria Gonorrhea: NEGATIVE

## 2017-01-28 NOTE — L&D Delivery Note (Addendum)
Patient: Julie Cline MRN: [id number]  GBS status: negative, IAP not indicated  Patient is a 22 y.o. now G2P2 s/p NSVD at 7484w5d, who was admitted for PPROM. PPROM 10h 3112m prior to delivery with clear fluid.  Head delivered LOA. Loose nuchal cord present and reduced following delivery of the body. Shoulder and body delivered in usual fashion. Infant with spontaneous cry, placed on mother's abdomen, dried and bulb suctioned. Cord clamped x 2 after 1-minute delay, and cut by family member. Cord blood drawn. Placenta delivered spontaneously with gentle cord traction. Fundus firm with massage and Pitocin. Perineum inspected and found to have 1st degree laceration, which was found to be hemostatic.  Delivery Note At 7:31 PM a viable female was delivered via Vaginal, Spontaneous (Presentation: cephalic; LOA).  APGAR: 9, 9; weight pending.   Placenta status: iintact, 3 vessel cord.    Anesthesia: None  Episiotomy: None Lacerations: None;1st degree;Perineal Est. Blood Loss (mL): 150  Mom to postpartum.  Baby to Couplet care / Skin to Skin.  Julie Cline 01/05/2018, 8:45 PM   OB FELLOW DELIVERY ATTESTATION  I was gloved and present for the delivery in its entirety, and I agree with the above resident's note.    Please schedule this patient for Postpartum visit in: 4 weeks with the following provider: Any provider For C/S patients schedule nurse incision check in weeks 2 weeks: no Low risk pregnancy complicated by: none Delivery mode:  SVD Anticipated Birth Control:  Depo PP Procedures needed: none  Schedule Integrated BH visit: no  Julie AbbotNimeka Melesa Lecy, MD OB Fellow  01/05/2018, 9:26 PM

## 2017-03-24 ENCOUNTER — Emergency Department (HOSPITAL_COMMUNITY)
Admission: EM | Admit: 2017-03-24 | Discharge: 2017-03-24 | Disposition: A | Discharge status: Home / Self Care | Payer: Self-pay | Attending: Emergency Medicine | Admitting: Emergency Medicine

## 2017-03-24 ENCOUNTER — Encounter (HOSPITAL_COMMUNITY): Payer: Self-pay | Admitting: Emergency Medicine

## 2017-03-24 ENCOUNTER — Other Ambulatory Visit: Payer: Self-pay

## 2017-03-24 DIAGNOSIS — K029 Dental caries, unspecified: Secondary | ICD-10-CM | POA: Insufficient documentation

## 2017-03-24 DIAGNOSIS — Z79899 Other long term (current) drug therapy: Secondary | ICD-10-CM | POA: Insufficient documentation

## 2017-03-24 DIAGNOSIS — Z87891 Personal history of nicotine dependence: Secondary | ICD-10-CM | POA: Insufficient documentation

## 2017-03-24 MED ORDER — IBUPROFEN 800 MG PO TABS
800.0000 mg | ORAL_TABLET | Freq: Once | ORAL | Status: AC
  Administered 2017-03-24: 800 mg via ORAL
  Filled 2017-03-24: qty 1

## 2017-03-24 MED ORDER — BUPIVACAINE HCL (PF) 0.5 % IJ SOLN
10.0000 mL | Freq: Once | INTRAMUSCULAR | Status: AC
  Administered 2017-03-24: 10 mL
  Filled 2017-03-24: qty 10

## 2017-03-24 MED ORDER — PENICILLIN V POTASSIUM 500 MG PO TABS: 500.0000 mg | ORAL_TABLET | Freq: Four times a day (QID) | ORAL | 0 refills | Status: AC

## 2017-03-24 MED ORDER — PENICILLIN V POTASSIUM 250 MG PO TABS
500.0000 mg | ORAL_TABLET | Freq: Once | ORAL | Status: AC
Start: 2017-03-24 — End: 2017-03-24
  Administered 2017-03-24: 500 mg via ORAL
  Filled 2017-03-24: qty 2

## 2017-03-24 NOTE — ED Notes (Signed)
Patient on the phone with mother while being discharged.  Mother requesting that she turn the camera towards staff.  Informed patient that she can not film in the hospital.  Left at this time.

## 2017-03-24 NOTE — ED Provider Notes (Addendum)
TIME SEEN: 5:39 AM  CHIEF COMPLAINT: Right lower dental pain  HPI: Patient is a 22 year old female with no significant to the emergency department with right lower dental pain for several weeks.  Has been trying over-the-counter medication without relief.  Does not have a dentist.  No facial swelling.  No trismus or drooling.  No fever.  ROS: See HPI Constitutional: no fever  Eyes: no drainage  ENT: no runny nose   Cardiovascular:  no chest pain  Resp: no SOB  GI: no vomiting GU: no dysuria Integumentary: no rash  Allergy: no hives  Musculoskeletal: no leg swelling  Neurological: no slurred speech ROS otherwise negative  PAST MEDICAL HISTORY/PAST SURGICAL HISTORY:  Past Medical History:  Diagnosis Date  . Chlamydia   . Medical history non-contributory   . Urinary tract infection     MEDICATIONS:  Prior to Admission medications   Medication Sig Start Date End Date Taking? Authorizing Provider  ibuprofen (ADVIL,MOTRIN) 600 MG tablet Take 1 tablet (600 mg total) by mouth every 6 (six) hours as needed. Patient not taking: Reported on 01/25/2017 04/30/16   Rasch, Victorino DikeJennifer I, NP  megestrol (MEGACE) 40 MG tablet Take 1 tablet (40 mg total) by mouth 3 (three) times daily. 01/25/17   Montez MoritaLawson, Marie D, CNM  metroNIDAZOLE (FLAGYL) 500 MG tablet Take 1 tablet (500 mg total) by mouth 2 (two) times daily. 01/25/17   Montez MoritaLawson, Marie D, CNM    ALLERGIES:  No Known Allergies  SOCIAL HISTORY:  Social History   Tobacco Use  . Smoking status: Former Smoker    Packs/day: 0.25    Years: 6.00    Pack years: 1.50    Types: Cigarettes    Last attempt to quit: 01/27/2013    Years since quitting: 4.1  . Smokeless tobacco: Never Used  Substance Use Topics  . Alcohol use: No    FAMILY HISTORY: Family History  Problem Relation Age of Onset  . Diabetes Maternal Uncle   . Prostate cancer Paternal Grandfather   . Alcohol abuse Neg Hx   . Arthritis Neg Hx   . Asthma Neg Hx   . Birth defects  Neg Hx   . Cancer Neg Hx   . COPD Neg Hx   . Depression Neg Hx   . Drug abuse Neg Hx   . Early death Neg Hx   . Hearing loss Neg Hx   . Heart disease Neg Hx   . Hyperlipidemia Neg Hx   . Hypertension Neg Hx   . Kidney disease Neg Hx   . Learning disabilities Neg Hx   . Mental illness Neg Hx   . Mental retardation Neg Hx   . Miscarriages / Stillbirths Neg Hx   . Stroke Neg Hx   . Vision loss Neg Hx   . Varicose Veins Neg Hx     EXAM: BP 114/80 (BP Location: Left Arm)   Pulse 68   Temp 98.3 F (36.8 C) (Oral)   Resp 16   LMP 02/24/2017   SpO2 96%  CONSTITUTIONAL: Alert and oriented and responds appropriately to questions. Well-appearing; well-nourished HEAD: Normocephalic EYES: Conjunctivae clear, pupils appear equal, EOMI ENT: normal nose; moist mucous membranes; No pharyngeal erythema or petechiae, no tonsillar hypertrophy or exudate, no uvular deviation, no unilateral swelling, no trismus or drooling, no muffled voice, normal phonation, no stridor, multiple dental caries present, decay and tenderness noted to the right lower second molar, no drainable dental abscess noted, no Ludwig's angina, tongue sits flat  in the bottom of the mouth, no angioedema, no facial erythema or warmth, no facial swelling; no pain with movement of the neck. NECK: Supple, no meningismus, no nuchal rigidity, no LAD  CARD: RRR; S1 and S2 appreciated; no murmurs, no clicks, no rubs, no gallops RESP: Normal chest excursion without splinting or tachypnea; breath sounds clear and equal bilaterally; no wheezes, no rhonchi, no rales, no hypoxia or respiratory distress, speaking full sentences ABD/GI: Normal bowel sounds; non-distended; soft, non-tender, no rebound, no guarding, no peritoneal signs, no hepatosplenomegaly BACK:  The back appears normal and is non-tender to palpation, there is no CVA tenderness EXT: Normal ROM in all joints; non-tender to palpation; no edema; normal capillary refill; no cyanosis,  no calf tenderness or swelling    SKIN: Normal color for age and race; warm; no rash NEURO: Moves all extremities equally PSYCH: The patient's mood and manner are appropriate. Grooming and personal hygiene are appropriate.  MEDICAL DECISION MAKING: Patient here with dental caries causing dental pain.  Will place her on penicillin give ibuprofen.  Will perform dental block.  Discussed with patient that I do not feel narcotics are indicated for uncomplicated dental pain.  Will give outpatient dental follow-up information.  ED PROGRESS: Inferior alveolar block performed with moderate relief of pain.  Patient will be discharged.  Given outpatient dental follow-up.   At this time, I do not feel there is any life-threatening condition present. I have reviewed and discussed all results (EKG, imaging, lab, urine as appropriate) and exam findings with patient/family. I have reviewed nursing notes and appropriate previous records.  I feel the patient is safe to be discharged home without further emergent workup and can continue workup as an outpatient as needed. Discussed usual and customary return precautions. Patient/family verbalize understanding and are comfortable with this plan.  Outpatient follow-up has been provided if needed. All questions have been answered.     NERVE BLOCK Performed by: Baxter Hire Peja Allender Consent: Verbal consent obtained. Required items: required blood products, implants, devices, and special equipment available Time out: Immediately prior to procedure a "time out" was called to verify the correct patient, procedure, equipment, support staff and site/side marked as required.  Indication: Dental pain Nerve block body site: Inferior alveolar block  Preparation: Patient was prepped and draped in the usual sterile fashion. Needle gauge: 25 G Location technique: anatomical landmarks  Local anesthetic: 0.5% bupivacaine  Anesthetic total: 5 ml  Outcome: pain improved Patient  tolerance: Patient tolerated the procedure well with no immediate complications.    Bj Morlock, Layla Maw, DO 03/24/17 0622    Kannon Baum, Layla Maw, DO 03/24/17 [phone]

## 2017-03-24 NOTE — ED Triage Notes (Signed)
C/o R lower dental pain x 3 weeks. 

## 2017-03-24 NOTE — Discharge Instructions (Signed)
You may alternate Tylenol 1000 mg every 6 hours as needed for pain and Ibuprofen 800 mg every 8 hours as needed for pain.  Please take Ibuprofen with food.   We do not prescribe narcotics from the emergency department for uncomplicated dental pain.  Please take your antibiotics as prescribed and follow-up with a dentist.

## 2017-03-24 NOTE — ED Notes (Signed)
Julie RowanIMANI Cline [phone] FAMILY MEMBER TO CALL WHEN DISCHARGE

## 2017-05-05 ENCOUNTER — Encounter: Payer: Self-pay | Admitting: *Deleted

## 2017-05-29 ENCOUNTER — Inpatient Hospital Stay (HOSPITAL_COMMUNITY): Payer: Self-pay

## 2017-05-29 ENCOUNTER — Encounter (HOSPITAL_COMMUNITY): Payer: Self-pay | Admitting: *Deleted

## 2017-05-29 ENCOUNTER — Inpatient Hospital Stay (HOSPITAL_COMMUNITY)
Admission: AD | Admit: 2017-05-29 | Discharge: 2017-05-29 | Disposition: A | Discharge status: Home / Self Care | Payer: Self-pay | Source: Ambulatory Visit | Attending: Obstetrics & Gynecology | Admitting: Obstetrics & Gynecology

## 2017-05-29 DIAGNOSIS — R109 Unspecified abdominal pain: Secondary | ICD-10-CM

## 2017-05-29 DIAGNOSIS — N760 Acute vaginitis: Secondary | ICD-10-CM

## 2017-05-29 DIAGNOSIS — Z87891 Personal history of nicotine dependence: Secondary | ICD-10-CM | POA: Insufficient documentation

## 2017-05-29 DIAGNOSIS — Z3490 Encounter for supervision of normal pregnancy, unspecified, unspecified trimester: Secondary | ICD-10-CM

## 2017-05-29 DIAGNOSIS — O23591 Infection of other part of genital tract in pregnancy, first trimester: Secondary | ICD-10-CM | POA: Insufficient documentation

## 2017-05-29 DIAGNOSIS — Z3A01 Less than 8 weeks gestation of pregnancy: Secondary | ICD-10-CM | POA: Insufficient documentation

## 2017-05-29 DIAGNOSIS — O26899 Other specified pregnancy related conditions, unspecified trimester: Secondary | ICD-10-CM

## 2017-05-29 DIAGNOSIS — B9689 Other specified bacterial agents as the cause of diseases classified elsewhere: Secondary | ICD-10-CM

## 2017-05-29 LAB — WET PREP, GENITAL
Sperm: NONE SEEN
Trich, Wet Prep: NONE SEEN
YEAST WET PREP: NONE SEEN

## 2017-05-29 LAB — CBC
HCT: 37.5 % (ref 36.0–46.0)
Hemoglobin: 12.6 g/dL (ref 12.0–15.0)
MCH: 30.1 pg (ref 26.0–34.0)
MCHC: 33.6 g/dL (ref 30.0–36.0)
MCV: 89.7 fL (ref 78.0–100.0)
Platelets: 243 10*3/uL (ref 150–400)
RBC: 4.18 MIL/uL (ref 3.87–5.11)
RDW: 14.1 % (ref 11.5–15.5)
WBC: 3 10*3/uL — AB (ref 4.0–10.5)

## 2017-05-29 LAB — URINALYSIS, ROUTINE W REFLEX MICROSCOPIC
BILIRUBIN URINE: NEGATIVE
GLUCOSE, UA: NEGATIVE mg/dL
HGB URINE DIPSTICK: NEGATIVE
KETONES UR: NEGATIVE mg/dL
Leukocytes, UA: NEGATIVE
Nitrite: NEGATIVE
PH: 6 (ref 5.0–8.0)
Protein, ur: NEGATIVE mg/dL
SPECIFIC GRAVITY, URINE: 1.023 (ref 1.005–1.030)

## 2017-05-29 LAB — HCG, QUANTITATIVE, PREGNANCY: HCG, BETA CHAIN, QUANT, S: 4291 m[IU]/mL — AB (ref <5)

## 2017-05-29 LAB — POCT PREGNANCY, URINE: Preg Test, Ur: POSITIVE — AB

## 2017-05-29 MED ORDER — PRENATAL ADULT GUMMY/DHA/FA 0.4-25 MG PO CHEW: 1.0000 | CHEWABLE_TABLET | Freq: Every day | ORAL | 12 refills | Status: DC

## 2017-05-29 MED ORDER — METOCLOPRAMIDE HCL 10 MG PO TABS: 10.0000 mg | ORAL_TABLET | Freq: Four times a day (QID) | ORAL | 0 refills | Status: DC

## 2017-05-29 MED ORDER — METRONIDAZOLE 500 MG PO TABS: 500.0000 mg | ORAL_TABLET | Freq: Two times a day (BID) | ORAL | 0 refills | Status: DC

## 2017-05-29 NOTE — MAU Provider Note (Signed)
History     CSN: [id number]  Arrival date and time: 05/29/17 2130   First Provider Initiated Contact with Patient 05/29/17 1034      Chief Complaint  Patient presents with  . Back Pain   HPI  Ms.  Julie Cline is a 22 y.o. year old G69P1011 female at [redacted]w[redacted]d weeks gestation who presents to MAU reporting (+) HPT and lower abdominal pain. She signed in with lower back pain, but states that the back pain is not a bother to her like the lower abdominal pain. She denies VB or abnormal vaginal d/c.  Past Medical History:  Diagnosis Date  . Chlamydia   . Medical history non-contributory   . Urinary tract infection     Past Surgical History:  Procedure Laterality Date  . EYE SURGERY     Eye lid  . HERNIA REPAIR      Family History  Problem Relation Age of Onset  . Diabetes Maternal Uncle   . Prostate cancer Paternal Grandfather   . Alcohol abuse Neg Hx   . Arthritis Neg Hx   . Asthma Neg Hx   . Birth defects Neg Hx   . Cancer Neg Hx   . COPD Neg Hx   . Depression Neg Hx   . Drug abuse Neg Hx   . Early death Neg Hx   . Hearing loss Neg Hx   . Heart disease Neg Hx   . Hyperlipidemia Neg Hx   . Hypertension Neg Hx   . Kidney disease Neg Hx   . Learning disabilities Neg Hx   . Mental illness Neg Hx   . Mental retardation Neg Hx   . Miscarriages / Stillbirths Neg Hx   . Stroke Neg Hx   . Vision loss Neg Hx   . Varicose Veins Neg Hx     Social History   Tobacco Use  . Smoking status: Former Smoker    Packs/day: 0.25    Years: 6.00    Pack years: 1.50    Types: Cigarettes    Last attempt to quit: 01/27/2013    Years since quitting: 4.3  . Smokeless tobacco: Never Used  Substance Use Topics  . Alcohol use: No  . Drug use: Yes    Frequency: 7.0 times per week    Types: Marijuana    Comment: daily    Allergies: No Known Allergies  Medications Prior to Admission  Medication Sig Dispense Refill Last Dose  . ibuprofen (ADVIL,MOTRIN) 600 MG tablet Take 1  tablet (600 mg total) by mouth every 6 (six) hours as needed. (Patient not taking: Reported on 01/25/2017) 30 tablet 0 Not Taking at Unknown time  . megestrol (MEGACE) 40 MG tablet Take 1 tablet (40 mg total) by mouth 3 (three) times daily. 30 tablet 0   . metroNIDAZOLE (FLAGYL) 500 MG tablet Take 1 tablet (500 mg total) by mouth 2 (two) times daily. 14 tablet 0     Review of Systems  Constitutional: Negative.   HENT: Negative.   Eyes: Negative.   Respiratory: Negative.   Cardiovascular: Negative.   Gastrointestinal: Positive for abdominal pain (lower abd pain).  Endocrine: Negative.   Genitourinary: Positive for pelvic pain.  Musculoskeletal: Negative.   Skin: Negative.   Allergic/Immunologic: Negative.   Neurological: Negative.   Hematological: Negative.   Psychiatric/Behavioral: Negative.    Physical Exam   Blood pressure 110/76, pulse 82, temperature 98.7 F (37.1 C), resp. rate 18, height  (1.651 m), weight 126  lb (57.2 kg), last menstrual period 04/23/2017.  Physical Exam  Nursing note and vitals reviewed. Constitutional: She is oriented to person, place, and time. She appears well-developed and well-nourished.  HENT:  Head: Normocephalic and atraumatic.  Eyes: Pupils are equal, round, and reactive to light.  Neck: Normal range of motion.  Cardiovascular: Normal rate, regular rhythm and normal heart sounds.  Respiratory: Effort normal and breath sounds normal.  GI: Soft. Bowel sounds are normal.  Genitourinary:  Genitourinary Comments: Uterus: mildly tender and slightly enlarged, SE: cervix is smooth, pink, no lesions, small amt of thick, malodorous, white vaginal d/c -- WP, GC/CT done, closed/long/firm, no CMT or friability, no adnexal tenderness   Musculoskeletal: Normal range of motion.  Neurological: She is alert and oriented to person, place, and time.  Skin: Skin is warm and dry.  Psychiatric: She has a normal mood and affect. Her behavior is normal. Judgment  and thought content normal.    MAU Course  Procedures  MDM CCUA UPT CBC w/Diff ABO/Rh -- A Positive; already known HCG Wet Prep GC/CT -- pending HIV -- pending OB < 14 wks [place] with TV  Results for orders placed or performed during the hospital encounter of 05/29/17 (from the past 24 hour(s))  Urinalysis, Routine w reflex microscopic     Status: None   Collection Time: 05/29/17  9:48 AM  Result Value Ref Range   Color, Urine YELLOW YELLOW   APPearance CLEAR CLEAR   Specific Gravity, Urine 1.023 1.005 - 1.030   pH 6.0 5.0 - 8.0   Glucose, UA NEGATIVE NEGATIVE mg/dL   Hgb urine dipstick NEGATIVE NEGATIVE   Bilirubin Urine NEGATIVE NEGATIVE   Ketones, ur NEGATIVE NEGATIVE mg/dL   Protein, ur NEGATIVE NEGATIVE mg/dL   Nitrite NEGATIVE NEGATIVE   Leukocytes, UA NEGATIVE NEGATIVE  Pregnancy, urine POC     Status: Abnormal   Collection Time: 05/29/17  9:57 AM  Result Value Ref Range   Preg Test, Ur POSITIVE (A) NEGATIVE  CBC     Status: Abnormal   Collection Time: 05/29/17 10:42 AM  Result Value Ref Range   WBC 3.0 (L) 4.0 - 10.5 K/uL   RBC 4.18 3.87 - 5.11 MIL/uL   Hemoglobin 12.6 12.0 - 15.0 g/dL   HCT 09.8 11.9 - 14.7 %   MCV 89.7 78.0 - 100.0 fL   MCH 30.1 26.0 - 34.0 pg   MCHC 33.6 30.0 - 36.0 g/dL   RDW 82.9 56.2 - 13.0 %   Platelets 243 150 - 400 K/uL  Wet prep, genital     Status: Abnormal   Collection Time: 05/29/17 10:45 AM  Result Value Ref Range   Yeast Wet Prep HPF POC NONE SEEN NONE SEEN   Trich, Wet Prep NONE SEEN NONE SEEN   Clue Cells Wet Prep HPF POC PRESENT (A) NONE SEEN   WBC, Wet Prep HPF POC MODERATE (A) NONE SEEN   Sperm NONE SEEN     US Ob Less Than 14 Weeks With Ob Transvaginal  Result Date: 05/29/2017 CLINICAL DATA:  Back pain. EXAM: OBSTETRIC <14 WK [place] AND TRANSVAGINAL OB US TECHNIQUE: Both transabdominal and transvaginal ultrasound examinations were performed for complete evaluation of the gestation as well as the maternal uterus, adnexal  regions, and pelvic cul-de-sac. Transvaginal technique was performed to assess early pregnancy. COMPARISON:  None. FINDINGS: Intrauterine gestational sac: Single Yolk sac:  Yes Embryo:  Not Visualized. Cardiac Activity: Not Visualized. MSD: 6.5 mm   5 w  2 d Maternal uterus/adnexae: Subchorionic hemorrhage: None Right ovary: Appears normal containing a corpus luteal cyst Left ovary: Appears normal. Other :None Free fluid:  None IMPRESSION: 1. Probable early intrauterine gestational sac with a yolk sac, but there is no fetal pole, or cardiac activity yet visualized. Recommend follow-up quantitative B-HCG levels and follow-up US in 14 days to confirm and assess viability. This recommendation follows SRU consensus guidelines: Diagnostic Criteria for Nonviable Pregnancy Early in the First Trimester. Malva Limes Med 2013; 811:9147-82. Electronically Signed   By: Signa Kell M.D.   On: 05/29/2017 11:20     Assessment and Plan  Abdominal pain affecting pregnancy  - Ok to take Tylenol 100 mg QID prn pain - Information provided on abdominal pain pregnancy  Bacterial Vaginosis - Rx for Flagyl 500 mg BID x 7 days  - Information provided on BV  - Discharge home - Patient verbalized an understanding of the plan of care and agrees.   Raelyn Mora, MSN, CNM 05/29/2017, 10:34 AM

## 2017-05-29 NOTE — MAU Note (Signed)
Pt had positive pregnancy test. C/o back pain on and off . Denies any vag bleeding or discharge.

## 2017-05-30 LAB — GC/CHLAMYDIA PROBE AMP (~~LOC~~) NOT AT ARMC
Chlamydia: NEGATIVE
Neisseria Gonorrhea: NEGATIVE

## 2017-05-30 LAB — HIV ANTIBODY (ROUTINE TESTING W REFLEX): HIV Screen 4th Generation wRfx: NONREACTIVE

## 2017-06-04 ENCOUNTER — Ambulatory Visit (HOSPITAL_COMMUNITY)
Admission: RE | Admit: 2017-06-04 | Discharge: 2017-06-04 | Disposition: A | Discharge status: Home / Self Care | Payer: Self-pay | Source: Ambulatory Visit | Attending: Obstetrics and Gynecology | Admitting: Obstetrics and Gynecology

## 2017-06-04 ENCOUNTER — Ambulatory Visit (INDEPENDENT_AMBULATORY_CARE_PROVIDER_SITE_OTHER): Payer: Self-pay | Admitting: General Practice

## 2017-06-04 DIAGNOSIS — O3680X0 Pregnancy with inconclusive fetal viability, not applicable or unspecified: Secondary | ICD-10-CM

## 2017-06-04 DIAGNOSIS — O208 Other hemorrhage in early pregnancy: Secondary | ICD-10-CM | POA: Insufficient documentation

## 2017-06-04 DIAGNOSIS — Z3490 Encounter for supervision of normal pregnancy, unspecified, unspecified trimester: Secondary | ICD-10-CM

## 2017-06-04 DIAGNOSIS — Z3A01 Less than 8 weeks gestation of pregnancy: Secondary | ICD-10-CM | POA: Insufficient documentation

## 2017-06-04 DIAGNOSIS — Z712 Person consulting for explanation of examination or test findings: Secondary | ICD-10-CM

## 2017-06-04 NOTE — Progress Notes (Signed)
Patient here for viability results today. Reviewed ultrasound with Dr Vergie Living who finds living IUP, will do ultrasound in 7-10 days to continue to monitor fetal growth/HR. Scheduled ultrasound for 06/11/17.  Informed patient of results, reviewed dating, & provided pictures. Informed patient of follow up appt 5/15. Patient verbalized understanding to all & had no questions at this time.

## 2017-06-05 NOTE — Progress Notes (Signed)
I have reviewed the chart and agree with nursing staff's documentation of this patient's encounter.  Axzel Rockhill, MD 06/05/2017 10:14 AM    

## 2017-06-06 ENCOUNTER — Encounter (HOSPITAL_COMMUNITY): Payer: Self-pay | Admitting: *Deleted

## 2017-06-06 ENCOUNTER — Inpatient Hospital Stay (HOSPITAL_COMMUNITY)
Admission: AD | Admit: 2017-06-06 | Discharge: 2017-06-06 | Disposition: A | Discharge status: Home / Self Care | Payer: Self-pay | Source: Ambulatory Visit | Attending: Obstetrics and Gynecology | Admitting: Obstetrics and Gynecology

## 2017-06-06 DIAGNOSIS — B9689 Other specified bacterial agents as the cause of diseases classified elsewhere: Secondary | ICD-10-CM | POA: Insufficient documentation

## 2017-06-06 DIAGNOSIS — Z3490 Encounter for supervision of normal pregnancy, unspecified, unspecified trimester: Secondary | ICD-10-CM

## 2017-06-06 DIAGNOSIS — O26891 Other specified pregnancy related conditions, first trimester: Secondary | ICD-10-CM | POA: Insufficient documentation

## 2017-06-06 DIAGNOSIS — O26899 Other specified pregnancy related conditions, unspecified trimester: Secondary | ICD-10-CM

## 2017-06-06 DIAGNOSIS — O23591 Infection of other part of genital tract in pregnancy, first trimester: Secondary | ICD-10-CM | POA: Insufficient documentation

## 2017-06-06 DIAGNOSIS — Z3A00 Weeks of gestation of pregnancy not specified: Secondary | ICD-10-CM | POA: Insufficient documentation

## 2017-06-06 DIAGNOSIS — O210 Mild hyperemesis gravidarum: Secondary | ICD-10-CM | POA: Insufficient documentation

## 2017-06-06 DIAGNOSIS — R42 Dizziness and giddiness: Secondary | ICD-10-CM | POA: Insufficient documentation

## 2017-06-06 DIAGNOSIS — N760 Acute vaginitis: Secondary | ICD-10-CM | POA: Insufficient documentation

## 2017-06-06 DIAGNOSIS — R109 Unspecified abdominal pain: Secondary | ICD-10-CM | POA: Insufficient documentation

## 2017-06-06 DIAGNOSIS — O9989 Other specified diseases and conditions complicating pregnancy, childbirth and the puerperium: Secondary | ICD-10-CM

## 2017-06-06 LAB — URINALYSIS, ROUTINE W REFLEX MICROSCOPIC
Bilirubin Urine: NEGATIVE
GLUCOSE, UA: NEGATIVE mg/dL
HGB URINE DIPSTICK: NEGATIVE
Ketones, ur: NEGATIVE mg/dL
LEUKOCYTES UA: NEGATIVE
Nitrite: NEGATIVE
PROTEIN: NEGATIVE mg/dL
SPECIFIC GRAVITY, URINE: 1.025 (ref 1.005–1.030)
pH: 5 (ref 5.0–8.0)

## 2017-06-06 NOTE — MAU Provider Note (Signed)
Chief Complaint: Dizziness   SUBJECTIVE HPI: Julie Cline is a 22 y.o. G3P1011 at [redacted]w[redacted]d who presents to MAU with concerns about lightheadedness and dizziness for about 1 week.  Patient states that she will get these spells of dizziness and darkening vision for 10 seconds and then goes away.  She notices it mostly at work.  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  Patient states she has been trying to hydrate well.  She denies any vomiting, diarrhea, bleeding, fevers, rooming spining, tinnitus.  States that she is eating well.  Does state that she can improve on drinking more fluids. Denies vaginal discharge, abdominal pain, or vaginal bleeding.   Past Medical History:  Diagnosis Date  . Chlamydia   . Medical history non-contributory   . Urinary tract infection    OB History  Gravida Para Term Preterm AB Living  0 1 1  SAB TAB Ectopic Multiple Live Births  1 0 0 0 1    # Outcome Date GA Lbr Len/2nd Weight Sex Delivery Anes PTL Lv  3 Current           2 Term 11/01/15 [redacted]w[redacted]d 05:34 / 00:15 2.815 kg (6 lb 3.3 oz) M Vag-Spont EPI  LIV  1 SAB            Past Surgical History:  Procedure Laterality Date  . EYE SURGERY     Eye lid  . HERNIA REPAIR     Social History   Socioeconomic History  . Marital status: Single    Spouse name: Not on file  . Number of children: Not on file  . Years of education: Not on file  . Highest education level: Not on file  Occupational History  . Not on file  Social Needs  . Financial resource strain: Not on file  . Food insecurity:    Worry: Not on file    Inability: Not on file  . Transportation needs:    Medical: Not on file    Non-medical: Not on file  Tobacco Use  . Smoking status: Former Smoker    Packs/day: 0.25    Years: 6.00    Pack years: 1.50    Types: Cigarettes    Last attempt to quit: 01/27/2013    Years since quitting: 4.3  . Smokeless tobacco: Never Used  Substance and Sexual Activity   . Alcohol use: No  . Drug use: Yes    Frequency: 7.0 times per week    Types: Marijuana    Comment: last used 1400 today  . Sexual activity: Yes    Birth control/protection: None  Lifestyle  . Physical activity:    Days per week: Not on file    Minutes per session: Not on file  . Stress: Not on file  Relationships  . Social connections:    Talks on phone: Not on file    Gets together: Not on file    Attends religious service: Not on file    Active member of club or organization: Not on file    Attends meetings of clubs or organizations: Not on file    Relationship status: Not on file  . Intimate partner violence:    Fear of current or ex partner: Not on file    Emotionally abused: Not on file    Physically abused: Not on file    Forced sexual activity: Not on file  Other Topics Concern  . Not  on file  Social History Narrative  . Not on file   No current facility-administered medications on file prior to encounter.    Current Outpatient Medications on File Prior to Encounter  Medication Sig Dispense Refill  . metoCLOPramide (REGLAN) 10 MG tablet Take 1 tablet (10 mg total) by mouth every 6 (six) hours. 30 tablet 0  . metroNIDAZOLE (FLAGYL) 500 MG tablet Take 1 tablet (500 mg total) by mouth 2 (two) times daily. 14 tablet 0  . Prenatal MV & Min w/FA-DHA (PRENATAL ADULT GUMMY/DHA/FA) 0.4-25 MG CHEW Chew 1 tablet by mouth daily. 30 tablet 12   No Known Allergies  I have reviewed the past Medical Hx, Surgical Hx, Social Hx, Allergies and Medications.   REVIEW OF SYSTEMS All systems reviewed and are negative for acute change except as noted in the HPI.   OBJECTIVE BP 109/65 (BP Location: Left Arm)   Pulse 76   Temp 98.3 F (36.8 C)   Resp 16   Ht  (1.676 m)   Wt 56.7 kg (125 lb)   LMP 04/23/2017   BMI 20.18 kg/m    PHYSICAL EXAM Constitutional: Well-developed, well-nourished female in no acute distress.  O/P: MMM, clear Cardiovascular: normal rate and  rhythm, pulses intact, brisk cap refill Respiratory: normal rate and effort.  GI: Abd soft, non-tender, non-distended.  MS: Extremities nontender, no edema, normal ROM Neurologic: Alert and oriented x 4. No focal deficits Psych: normal mood and affect Skin: warm and dry  LAB RESULTS Results for orders placed or performed during the hospital encounter of 06/06/17 (from the past 24 hour(s))  Urinalysis, Routine w reflex microscopic     Status: None   Collection Time: 06/06/17  8:20 PM  Result Value Ref Range   Color, Urine YELLOW YELLOW   APPearance CLEAR CLEAR   Specific Gravity, Urine 1.025 1.005 - 1.030   pH 5.0 5.0 - 8.0   Glucose, UA NEGATIVE NEGATIVE mg/dL   Hgb urine dipstick NEGATIVE NEGATIVE   Bilirubin Urine NEGATIVE NEGATIVE   Ketones, ur NEGATIVE NEGATIVE mg/dL   Protein, ur NEGATIVE NEGATIVE mg/dL   Nitrite NEGATIVE NEGATIVE   Leukocytes, UA NEGATIVE NEGATIVE    IMAGING US Ob Transvaginal  Result Date: 06/04/2017 CLINICAL DATA:  Pregnancy of unknown anatomic location EXAM: TRANSVAGINAL OB ULTRASOUND TECHNIQUE: Transvaginal ultrasound was performed for complete evaluation of the gestation as well as the maternal uterus, adnexal regions, and pelvic cul-de-sac. COMPARISON:  05/29/2017 FINDINGS: Intrauterine gestational sac: Present Yolk sac:  Present Embryo:  Present Cardiac Activity: Present Heart Rate: 92 bpm MSD: 10.3 mm corresponding to 5 w 5 d EGA CRL: 1.8 mm (below range of chart for EGA) w d [place] EDC: Subchorionic hemorrhage:  Small subchronic hemorrhage 14 x 5 x 11 mm Maternal uterus/adnexae: LEFT ovary normal size and morphology 2.8 x 1.0 x 1.3 cm. RIGHT ovary measures 3.7 x 1.7 x 2.0 cm and contains a small corpus luteal cyst. Trace free pelvic fluid. No adnexal masses. IMPRESSION: Single live intrauterine gestation as above. Small subchronic hemorrhage. Electronically Signed   By: Ulyses Southward M.D.   On: 06/04/2017 13:36   US Ob Less Than 14 Weeks With Ob  Transvaginal  Result Date: 05/29/2017 CLINICAL DATA:  Back pain. EXAM: OBSTETRIC <14 WK [place] AND TRANSVAGINAL OB US TECHNIQUE: Both transabdominal and transvaginal ultrasound examinations were performed for complete evaluation of the gestation as well as the maternal uterus, adnexal regions, and pelvic cul-de-sac. Transvaginal technique was performed to assess early pregnancy.  COMPARISON:  None. FINDINGS: Intrauterine gestational sac: Single Yolk sac:  Yes Embryo:  Not Visualized. Cardiac Activity: Not Visualized. MSD: 6.5 mm   5 w   2 d Maternal uterus/adnexae: Subchorionic hemorrhage: None Right ovary: Appears normal containing a corpus luteal cyst Left ovary: Appears normal. Other :None Free fluid:  None IMPRESSION: 1. Probable early intrauterine gestational sac with a yolk sac, but there is no fetal pole, or cardiac activity yet visualized. Recommend follow-up quantitative B-HCG levels and follow-up US in 14 days to confirm and assess viability. This recommendation follows SRU consensus guidelines: Diagnostic Criteria for Nonviable Pregnancy Early in the First Trimester. Malva Limes Med 2013; 161:0960-45. Electronically Signed   By: Signa Kell M.D.   On: 05/29/2017 11:20    MAU Management/MDM: Vitals and nursing notes reviewed Orders Placed This Encounter  Procedures  . Urinalysis, Routine w reflex microscopic  . Orthostatic vital signs  . Discharge patient Discharge disposition: 01-Home or Self Care; Discharge patient date: 06/06/2017    No orders of the defined types were placed in this encounter.  Symptoms most likely due to poor fluid intake vs noxious stimuli. No red flags. VSS. Orthostatic vitals normal. Not dehydrated. Recent CBC without signs of anemia.   Plan of care reviewed with patient, including labs and tests ordered and medical treatment.   ASSESSMENT 1. Dizziness, nonspecific   2. Bacterial vaginitis   3. Abdominal pain affecting pregnancy   4. Early stage of pregnancy   5.  Morning sickness     PLAN Discharge home in stable condition. Counseled on return precautions Handout given    Allergies as of 06/06/2017   No Known Allergies     Medication List    TAKE these medications   metoCLOPramide 10 MG tablet Commonly known as:  REGLAN Take 1 tablet (10 mg total) by mouth every 6 (six) hours.   metroNIDAZOLE 500 MG tablet Commonly known as:  FLAGYL Take 1 tablet (500 mg total) by mouth 2 (two) times daily.   Prenatal Adult Gummy/DHA/FA 0.4-25 MG Chew Chew 1 tablet by mouth daily.        Caryl Ada, DO OB Fellow Center for [facility], [facility] 06/07/2017, 12:20 AM

## 2017-06-06 NOTE — Discharge Instructions (Signed)

## 2017-06-06 NOTE — Progress Notes (Signed)
Dr Doroteo Glassman in earlier to discuss d/c plan with pt. Written and verbal d/c instructions given and understanding voiced

## 2017-06-06 NOTE — MAU Note (Addendum)
Dizzy and lightheaded for a week. No pain. No vag bleeding or d/c. IUP per u/s. Occ nausea but able to eat and drink

## 2017-06-11 ENCOUNTER — Ambulatory Visit (INDEPENDENT_AMBULATORY_CARE_PROVIDER_SITE_OTHER): Payer: Self-pay

## 2017-06-11 ENCOUNTER — Ambulatory Visit (HOSPITAL_COMMUNITY)
Admission: RE | Admit: 2017-06-11 | Discharge: 2017-06-11 | Disposition: A | Discharge status: Home / Self Care | Payer: Self-pay | Source: Ambulatory Visit | Attending: Obstetrics and Gynecology | Admitting: Obstetrics and Gynecology

## 2017-06-11 ENCOUNTER — Encounter: Payer: Self-pay | Admitting: Obstetrics & Gynecology

## 2017-06-11 DIAGNOSIS — Z712 Person consulting for explanation of examination or test findings: Secondary | ICD-10-CM

## 2017-06-11 DIAGNOSIS — Z3A01 Less than 8 weeks gestation of pregnancy: Secondary | ICD-10-CM | POA: Insufficient documentation

## 2017-06-11 DIAGNOSIS — O3680X0 Pregnancy with inconclusive fetal viability, not applicable or unspecified: Secondary | ICD-10-CM | POA: Insufficient documentation

## 2017-06-18 NOTE — Progress Notes (Signed)
Pt here for [place] results.  Pt informed of good pregnancy and that she can start prenatal care.  Medications safe to take in pregnancy list give to pt.  Proof of pregnancy letter provided.  Pt stated understanding with no further questions.

## 2017-06-19 NOTE — Progress Notes (Signed)
I have reviewed this chart and agree with the RN/CMA assessment and management.    Zamiyah Resendes C Hadasa Gasner, MD, FACOG Attending Physician, [facility]  

## 2017-07-09 ENCOUNTER — Encounter: Payer: Self-pay | Admitting: Obstetrics and Gynecology

## 2017-07-09 ENCOUNTER — Encounter: Payer: Self-pay | Admitting: General Practice

## 2017-07-09 ENCOUNTER — Ambulatory Visit (INDEPENDENT_AMBULATORY_CARE_PROVIDER_SITE_OTHER): Payer: Self-pay | Admitting: Obstetrics and Gynecology

## 2017-07-09 ENCOUNTER — Ambulatory Visit: Payer: Self-pay

## 2017-07-09 VITALS — BP 115/71 | HR 68 | Wt 136.4 lb

## 2017-07-09 DIAGNOSIS — Z113 Encounter for screening for infections with a predominantly sexual mode of transmission: Secondary | ICD-10-CM

## 2017-07-09 DIAGNOSIS — Z3480 Encounter for supervision of other normal pregnancy, unspecified trimester: Secondary | ICD-10-CM

## 2017-07-09 NOTE — Progress Notes (Signed)
Will be applying for [place] Medicaid and Charity application given to patient.

## 2017-07-09 NOTE — Progress Notes (Signed)
New OB Note  07/09/2017   [facility]  Chief Complaint: NOB  Transfer of Care Patient: no  History of Present Illness: Ms. Julie Cline is a 22 y.o. G3P1011 @ 11/0 weeks (EDC 1/1  LMP=6wk u/s, based on Patient's last menstrual period was 04/23/2017.).  Preg complicated by has Supervision of normal pregnancy and History of placenta abruption on their problem list.   Any events prior to today's visit: no Her periods were: qmonth, regular She was using no method when she conceived.  She has Negative signs or symptoms of nausea/vomiting of pregnancy. She has Negative signs or symptoms of miscarriage or preterm labor On any medications around the time she conceived/early pregnancy: No   ROS: A 12-point review of systems was performed and negative, except as stated in the above HPI.  OBGYN History: As per HPI. OB History  Gravida Para Term Preterm AB Living  3 1 1  0 1 1  SAB TAB Ectopic Multiple Live Births  1 0 0 0 1    # Outcome Date GA Lbr Len/2nd Weight Sex Delivery Anes PTL Lv  3 Current           2 Term 11/01/15 4026w0d 05:34 / 00:15 6 lb 3.3 oz (2.815 kg) M Vag-Spont EPI  LIV  1 SAB             Any issues with any prior pregnancies: no Prior children are healthy, doing well, and without any problems or issues: yes History of pap smears: Yes. Last pap smear 2018 and results were NILM   Past Medical History: Past Medical History:  Diagnosis Date  . Chlamydia   . Gonorrhea 07/05/2016   To go to HD for tx  . Urinary tract infection     Past Surgical History: Past Surgical History:  Procedure Laterality Date  . EYE SURGERY     Eye lid  . HERNIA REPAIR      Family History:  Family History  Problem Relation Age of Onset  . Diabetes Maternal Uncle   . Prostate cancer Paternal Grandfather   . Alcohol abuse Neg Hx   . Arthritis Neg Hx   . Asthma Neg Hx   . Birth defects Neg Hx   . Cancer Neg Hx   . COPD Neg Hx   . Depression Neg Hx   .  Drug abuse Neg Hx   . Early death Neg Hx   . Hearing loss Neg Hx   . Heart disease Neg Hx   . Hyperlipidemia Neg Hx   . Hypertension Neg Hx   . Kidney disease Neg Hx   . Learning disabilities Neg Hx   . Mental illness Neg Hx   . Mental retardation Neg Hx   . Miscarriages / Stillbirths Neg Hx   . Stroke Neg Hx   . Vision loss Neg Hx   . Varicose Veins Neg Hx    She denies any history of mental retardation, birth defects or genetic disorders in her or the FOB's history  Social History:  Social History   Socioeconomic History  . Marital status: Single    Spouse name: Not on file  . Number of children: Not on file  . Years of education: Not on file  . Highest education level: Not on file  Occupational History  . Not on file  Social Needs  . Financial resource strain: Not on file  . Food insecurity:    Worry: Not on file  Inability: Not on file  . Transportation needs:    Medical: Not on file    Non-medical: Not on file  Tobacco Use  . Smoking status: Former Smoker    Packs/day: 0.25    Years: 6.00    Pack years: 1.50    Types: Cigarettes    Last attempt to quit: 01/27/2013    Years since quitting: 4.4  . Smokeless tobacco: Never Used  Substance and Sexual Activity  . Alcohol use: No  . Drug use: Yes    Frequency: 7.0 times per week    Types: Marijuana    Comment: last used 1400 today  . Sexual activity: Yes    Birth control/protection: None  Lifestyle  . Physical activity:    Days per week: Not on file    Minutes per session: Not on file  . Stress: Not on file  Relationships  . Social connections:    Talks on phone: Not on file    Gets together: Not on file    Attends religious service: Not on file    Active member of club or organization: Not on file    Attends meetings of clubs or organizations: Not on file    Relationship status: Not on file  . Intimate partner violence:    Fear of current or ex partner: Not on file    Emotionally abused: Not on  file    Physically abused: Not on file    Forced sexual activity: Not on file  Other Topics Concern  . Not on file  Social History Narrative  . Not on file    Allergy: No Known Allergies  Current Outpatient Medications: PNV (gummies)  Physical Exam:   BP 115/71   Pulse 68   Wt 136 lb 6.4 oz (61.9 kg)   LMP 04/23/2017   BMI 22.02 kg/m  Body mass index is 22.02 kg/m. Contractions: Not present Vag. Bleeding: None.  General appearance: Well nourished, well developed female in no acute distress.  Neck:  Supple, normal appearance, and no thyromegaly  Cardiovascular: S1, S2 normal, no murmur, rub or gallop, regular rate and rhythm Respiratory:  Clear to auscultation bilateral. Normal respiratory effort Abdomen: positive bowel sounds and no masses, hernias; diffusely non tender to palpation, non distended Breasts: pt declines any breast s/s Neuro/Psych:  Normal mood and affect.  Skin:  Warm and dry.  Lymphatic:  No inguinal lymphadenopathy.   Pelvic exam: is not limited by body habitus EGBUS: within normal limits, Vagina: within normal limits and with no blood in the vault, Cervix: normal appearing cervix without discharge or lesions, closed/long/high, Uterus:  enlarged, c/w 10 week size, and Adnexa:  normal adnexa and no mass, fullness, tenderness  Laboratory: none  Imaging:  No new imaging  Assessment: pt doing well  Plan: 1. Supervision of other normal pregnancy, antepartum  Forgot to [facility] today. I called her and she is to come back later today. Pt declines genetics. Offer afp tetra nv and scheduled anatomy u/s nv. D/w her re: babyscripts and pt to consider - Culture, OB Urine - Obstetric Panel, Including HIV - Cervicovaginal ancillary only - CBC - Hepatitis C Antibody  Problem list reviewed and updated.  Follow up in 3-4 weeks.  >50% of 20 min visit spent on counseling and coordination of care.     Cornelia Copa MD Attending Center for [facility]  Healthcare Midwife) thc

## 2017-07-10 LAB — CERVICOVAGINAL ANCILLARY ONLY
Chlamydia: NEGATIVE
NEISSERIA GONORRHEA: NEGATIVE
TRICH (WINDOWPATH): NEGATIVE

## 2017-07-11 LAB — OBSTETRIC PANEL, INCLUDING HIV
Antibody Screen: NEGATIVE
Basophils Absolute: 0 10*3/uL (ref 0.0–0.2)
Basos: 0 %
EOS (ABSOLUTE): 0.1 10*3/uL (ref 0.0–0.4)
EOS: 2 %
HEMOGLOBIN: 12.7 g/dL (ref 11.1–15.9)
HEP B S AG: NEGATIVE
HIV Screen 4th Generation wRfx: NONREACTIVE
Hematocrit: 38.5 % (ref 34.0–46.6)
IMMATURE GRANS (ABS): 0 10*3/uL (ref 0.0–0.1)
IMMATURE GRANULOCYTES: 0 %
Lymphocytes Absolute: 2 10*3/uL (ref 0.7–3.1)
Lymphs: 33 %
MCH: 29.7 pg (ref 26.6–33.0)
MCHC: 33 g/dL (ref 31.5–35.7)
MCV: 90 fL (ref 79–97)
MONOS ABS: 0.5 10*3/uL (ref 0.1–0.9)
Monocytes: 8 %
NEUTROS PCT: 57 %
Neutrophils Absolute: 3.5 10*3/uL (ref 1.4–7.0)
Platelets: 283 10*3/uL (ref 150–450)
RBC: 4.27 x10E6/uL (ref 3.77–5.28)
RDW: 14.4 % (ref 12.3–15.4)
RH TYPE: POSITIVE
RPR Ser Ql: NONREACTIVE
Rubella Antibodies, IGG: 1 index (ref >0.99)
WBC: 6.1 10*3/uL (ref 3.4–10.8)

## 2017-07-11 LAB — URINE CULTURE, OB REFLEX

## 2017-07-11 LAB — HEPATITIS C ANTIBODY

## 2017-07-11 LAB — CULTURE, OB URINE

## 2017-08-05 ENCOUNTER — Ambulatory Visit (INDEPENDENT_AMBULATORY_CARE_PROVIDER_SITE_OTHER): Payer: Self-pay | Admitting: Student

## 2017-08-05 VITALS — BP 116/74 | HR 62 | Wt 133.7 lb

## 2017-08-05 DIAGNOSIS — Z3482 Encounter for supervision of other normal pregnancy, second trimester: Secondary | ICD-10-CM

## 2017-08-05 NOTE — Progress Notes (Signed)
   PRENATAL VISIT NOTE  Subjective:  Julie Cline is a 22 y.o. G3P1011 at 3253w6d being seen today for ongoing prenatal care.  She is currently monitored for the following issues for this low-risk pregnancy and has Supervision of normal pregnancy and History of placenta abruption on their problem list.  Patient reports no complaints.  Contractions: Not present. Vag. Bleeding: None.  Movement: Present. Denies leaking of fluid.   The following portions of the patient's history were reviewed and updated as appropriate: allergies, current medications, past family history, past medical history, past social history, past surgical history and problem list. Problem list updated.  Objective:   Vitals:   08/05/17 0826  BP: 116/74  Pulse: 62  Weight: 133 lb 11.2 oz (60.6 kg)    Fetal Status: Fetal Heart Rate (bpm): 146   Movement: Present   FH midway between pubic bone & umbilicus  General:  Alert, oriented and cooperative. Patient is in no acute distress.  Skin: Skin is warm and dry. No rash noted.   Cardiovascular: Normal heart rate noted  Respiratory: Normal respiratory effort, no problems with respiration noted  Abdomen: Soft, gravid, appropriate for gestational age.  Pain/Pressure: Absent     Pelvic: Cervical exam deferred        Extremities: Normal range of motion.  Edema: None  Mental Status: Normal mood and affect. Normal behavior. Normal judgment and thought content.   Assessment and Plan:  Pregnancy: G3P1011 at 7153w6d  1. Encounter for supervision of other normal pregnancy in second trimester -Discussed genetic screening (NIPs, AFP, quad) --- declines - Cystic fibrosis gene test - SMN1 COPY NUMBER ANALYSIS (SMA Carrier Screen) - Hemoglobinopathy Evaluation  Preterm labor symptoms and general obstetric precautions including but not limited to vaginal bleeding, contractions, leaking of fluid and fetal movement were reviewed in detail with the patient. Please refer to After  Visit Summary for other counseling recommendations.  Return in about 1 month (around 09/02/2017) for Routine OB.  No future appointments.  Judeth HornErin Yarrow Linhart, NP

## 2017-08-05 NOTE — Patient Instructions (Signed)
Second Trimester of Pregnancy The second trimester is from week 13 through week 28, month 4 through 6. This is often the time in pregnancy that you feel your best. Often times, morning sickness has lessened or quit. You may have more energy, and you may get hungry more often. Your unborn baby (fetus) is growing rapidly. At the end of the sixth month, he or she is about 9 inches long and weighs about 1 pounds. You will likely feel the baby move (quickening) between 18 and 20 weeks of pregnancy.  Research childbirth classes and hospital preregistration at [email or web address]  Follow these instructions at home:  Avoid all smoking, herbs, and alcohol. Avoid drugs not approved by your doctor.  Do not use any tobacco products, including cigarettes, chewing tobacco, and electronic cigarettes. If you need help quitting, ask your doctor. You may get counseling or other support to help you quit.  Only take medicine as told by your doctor. Some medicines are safe and some are not during pregnancy.  Exercise only as told by your doctor. Stop exercising if you start having cramps.  Eat regular, healthy meals.  Wear a good support bra if your breasts are tender.  Do not use hot tubs, steam rooms, or saunas.  Wear your seat belt when driving.  Avoid raw meat, uncooked cheese, and liter boxes and soil used by cats.  Take your prenatal vitamins.  Take 1500-2000 milligrams of calcium daily starting at the 20th week of pregnancy until you deliver your baby.  Try taking medicine that helps you poop (stool softener) as needed, and if your doctor approves. Eat more fiber by eating fresh fruit, vegetables, and whole grains. Drink enough fluids to keep your pee (urine) clear or pale yellow.  Take warm water baths (sitz baths) to soothe pain or discomfort caused by hemorrhoids. Use hemorrhoid cream if your doctor approves.  If you have puffy, bulging veins (varicose veins), wear support hose. Raise  (elevate) your feet for 15 minutes, 3-4 times a day. Limit salt in your diet.  Avoid heavy lifting, wear low heals, and sit up straight.  Rest with your legs raised if you have leg cramps or low back pain.  Visit your dentist if you have not gone during your pregnancy. Use a soft toothbrush to brush your teeth. Be gentle when you floss.  You can have sex (intercourse) unless your doctor tells you not to.  Go to your doctor visits.  Get help if:  You feel dizzy.  You have mild cramps or pressure in your lower belly (abdomen).  You have a nagging pain in your belly area.  You continue to feel sick to your stomach (nauseous), throw up (vomit), or have watery poop (diarrhea).  You have bad smelling fluid coming from your vagina.  You have pain with peeing (urination). Get help right away if:  You have a fever.  You are leaking fluid from your vagina.  You have spotting or bleeding from your vagina.  You have severe belly cramping or pain.  You lose or gain weight rapidly.  You have trouble catching your breath and have chest pain.  You notice sudden or extreme puffiness (swelling) of your face, hands, ankles, feet, or legs.  You have not felt the baby move in over an hour.  You have severe headaches that do not go away with medicine.  You have vision changes. This information is not intended to replace advice given to you by your health care provider. Make   sure you discuss any questions you have with your health care provider. Document Released: 04/10/2009 Document Revised: 06/22/2015 Document Reviewed: 03/17/2012 Elsevier Interactive Patient Education  2017 [employer]    

## 2017-08-13 LAB — SMN1 COPY NUMBER ANALYSIS (SMA CARRIER SCREENING)

## 2017-08-13 LAB — HEMOGLOBINOPATHY EVALUATION
FERRITIN: 13 ng/mL — AB (ref 15–150)
HEMATOCRIT: 37.1 % (ref 34.0–46.6)
HEMOGLOBIN: 12.4 g/dL (ref 11.1–15.9)
HGB A2 QUANT: 2.4 % (ref 1.8–3.2)
HGB C: 0 %
HGB SOLUBILITY: NEGATIVE
HGB VARIANT: 0 %
Hgb A: 97.6 % (ref 96.4–98.8)
Hgb F Quant: 0 % (ref 0.0–2.0)
Hgb S: 0 %
MCH: 30.2 pg (ref 26.6–33.0)
MCHC: 33.4 g/dL (ref 31.5–35.7)
MCV: 91 fL (ref 79–97)
Platelets: 261 10*3/uL (ref 150–450)
RBC: 4.1 x10E6/uL (ref 3.77–5.28)
RDW: 14.5 % (ref 12.3–15.4)
WBC: 6.3 10*3/uL (ref 3.4–10.8)

## 2017-08-13 LAB — CYSTIC FIBROSIS GENE TEST

## 2017-08-21 ENCOUNTER — Inpatient Hospital Stay (HOSPITAL_COMMUNITY)
Admission: AD | Admit: 2017-08-21 | Discharge: 2017-08-21 | Disposition: A | Discharge status: Home / Self Care | Payer: Self-pay | Source: Ambulatory Visit | Attending: Obstetrics and Gynecology | Admitting: Obstetrics and Gynecology

## 2017-08-21 ENCOUNTER — Encounter (HOSPITAL_COMMUNITY): Payer: Self-pay | Admitting: *Deleted

## 2017-08-21 DIAGNOSIS — R102 Pelvic and perineal pain: Secondary | ICD-10-CM | POA: Insufficient documentation

## 2017-08-21 DIAGNOSIS — Z3A17 17 weeks gestation of pregnancy: Secondary | ICD-10-CM | POA: Insufficient documentation

## 2017-08-21 DIAGNOSIS — N949 Unspecified condition associated with female genital organs and menstrual cycle: Secondary | ICD-10-CM

## 2017-08-21 DIAGNOSIS — Z8619 Personal history of other infectious and parasitic diseases: Secondary | ICD-10-CM | POA: Insufficient documentation

## 2017-08-21 DIAGNOSIS — Z87891 Personal history of nicotine dependence: Secondary | ICD-10-CM | POA: Insufficient documentation

## 2017-08-21 DIAGNOSIS — O26892 Other specified pregnancy related conditions, second trimester: Secondary | ICD-10-CM | POA: Insufficient documentation

## 2017-08-21 LAB — URINALYSIS, ROUTINE W REFLEX MICROSCOPIC
Bilirubin Urine: NEGATIVE
Glucose, UA: NEGATIVE mg/dL
HGB URINE DIPSTICK: NEGATIVE
Ketones, ur: NEGATIVE mg/dL
Leukocytes, UA: NEGATIVE
Nitrite: NEGATIVE
Protein, ur: NEGATIVE mg/dL
Specific Gravity, Urine: 1.015 (ref 1.005–1.030)
pH: 8 (ref 5.0–8.0)

## 2017-08-21 NOTE — MAU Provider Note (Signed)
History     CSN: [id number]  Arrival date and time: 08/21/17 1054   First Provider Initiated Contact with Patient 08/21/17 1155      Chief Complaint  Patient presents with  . Abdominal Pain  . Back Pain   HPI Ms. Julie Cline is a 22 year old G3P1011 [redacted]w[redacted]d who presents to the MAU with a chief complaint of lower abdominal and back pain. Describes pain as sharp and constant, with tightness bye her pelvis. She states it started yesterday and it is difficult for her to stand for work.Rates pain as a 6/10 and is constant. She has not tried anything to make the pain go away. Patient states it is hard to stand for a long time at work and it hurts with movement and walking. Patient requests work note.  OB History    Gravida  3   Para  1   Term  1   Preterm  0   AB  1   Living  1     SAB  1   TAB  0   Ectopic  0   Multiple  0   Live Births  1           Past Medical History:  Diagnosis Date  . Chlamydia   . Gonorrhea 07/05/2016   To go to HD for tx  . Urinary tract infection     Past Surgical History:  Procedure Laterality Date  . EYE SURGERY     Eye lid  . HERNIA REPAIR      Family History  Problem Relation Age of Onset  . Diabetes Maternal Uncle   . Prostate cancer Paternal Grandfather   . Alcohol abuse Neg Hx   . Arthritis Neg Hx   . Asthma Neg Hx   . Birth defects Neg Hx   . Cancer Neg Hx   . COPD Neg Hx   . Depression Neg Hx   . Drug abuse Neg Hx   . Early death Neg Hx   . Hearing loss Neg Hx   . Heart disease Neg Hx   . Hyperlipidemia Neg Hx   . Hypertension Neg Hx   . Kidney disease Neg Hx   . Learning disabilities Neg Hx   . Mental illness Neg Hx   . Mental retardation Neg Hx   . Miscarriages / Stillbirths Neg Hx   . Stroke Neg Hx   . Vision loss Neg Hx   . Varicose Veins Neg Hx     Social History   Tobacco Use  . Smoking status: Former Smoker    Packs/day: 0.25    Years: 6.00    Pack years: 1.50    Types: Cigarettes   Last attempt to quit: 01/27/2013    Years since quitting: 4.5  . Smokeless tobacco: Never Used  Substance Use Topics  . Alcohol use: No  . Drug use: Yes    Frequency: 7.0 times per week    Types: Marijuana    Comment: used 08/20/2017    Allergies: No Known Allergies  No medications prior to admission.    Review of Systems  Constitutional: Negative.   Respiratory: Negative.   Gastrointestinal: Positive for abdominal pain. Negative for diarrhea, nausea and vomiting.  Genitourinary: Positive for difficulty urinating. Negative for dysuria, hematuria, vaginal bleeding and vaginal discharge.       Some difficulty urinating, attributes to pain  Neurological: Negative for dizziness, light-headedness and headaches.   Physical Exam   Blood pressure  115/65, pulse 73, temperature 98.2 F (36.8 C), temperature source Oral, resp. rate 16, height 5\' 4"  (1.626 m), weight 61.7 kg (136 lb), last menstrual period 04/23/2017, unknown if currently breastfeeding.  Physical Exam  Constitutional: She appears well-developed and well-nourished.  HENT:  Head: Normocephalic and atraumatic.  Cardiovascular: Normal rate and regular rhythm.  Respiratory: Effort normal and breath sounds normal.  GI: Soft. Bowel sounds are normal. There is tenderness. There is rebound.  Sharp and tight pain in suprapubic region  Skin: Skin is warm and dry.    MAU Course   MDM UA Results for orders placed or performed during the hospital encounter of 08/21/17 (from the past 24 hour(s))  Urinalysis, Routine w reflex microscopic     Status: Abnormal   Collection Time: 08/21/17 11:16 AM  Result Value Ref Range   Color, Urine YELLOW YELLOW   APPearance CLOUDY (A) CLEAR   Specific Gravity, Urine 1.015 1.005 - 1.030   pH 8.0 5.0 - 8.0   Glucose, UA NEGATIVE NEGATIVE mg/dL   Hgb urine dipstick NEGATIVE NEGATIVE   Bilirubin Urine NEGATIVE NEGATIVE   Ketones, ur NEGATIVE NEGATIVE mg/dL   Protein, ur NEGATIVE NEGATIVE  mg/dL   Nitrite NEGATIVE NEGATIVE   Leukocytes, UA NEGATIVE NEGATIVE     Assessment and Plan  Round Ligament Pain, 1121w1d  Discharge to home Patient can take tylenol prn for pain Precautions discussed  Patient will receive a work note for today Patient advised to return to MAU if condition changes or worsens  Candie MileLindsey R Jaquana Geiger 08/21/2017, 12:21 PM

## 2017-08-21 NOTE — MAU Note (Signed)
Pt C/O lower back pain x 2 days, can't lay on her back.  Also lower abdominal pain & tightening.  Denies bleeding.

## 2017-08-21 NOTE — Discharge Instructions (Signed)

## 2017-08-21 NOTE — MAU Provider Note (Signed)
History     CSN: [id number]  Arrival date and time: 08/21/17 1054   First Provider Initiated Contact with Patient 08/21/17 1155      Chief Complaint  Patient presents with  . Abdominal Pain  . Back Pain   Julie Cline is a 22 y.o. G3P1011 at [redacted]w[redacted]d presenting with 1 week history of suprapubic abdominal pain.  Pain radiates to groin.  It is exacerbated by movement and walking.  It is relieved by rest.  No similar episodes of pain. No associated vaginal irritation or discharge. No self treatment.  No antecedent injury or strenuous activity.  Good fetal movement.  No contractions, vaginal bleeding or leakage of fluid.    OB History  Gravida Para Term Preterm AB Living  3 1 1  0 1 1  SAB TAB Ectopic Multiple Live Births  1 0 0 0 1    # Outcome Date GA Lbr Len/2nd Weight Sex Delivery Anes PTL Lv  3 Current           2 Term 11/01/15 [redacted]w[redacted]d 05:34 / 00:15 6 lb 3.3 oz (2.815 kg) M Vag-Spont EPI  LIV  1 SAB              Past Medical History:  Diagnosis Date  . Chlamydia   . Gonorrhea 07/05/2016   To go to HD for tx  . Urinary tract infection     Past Surgical History:  Procedure Laterality Date  . EYE SURGERY     Eye lid  . HERNIA REPAIR      Family History  Problem Relation Age of Onset  . Diabetes Maternal Uncle   . Prostate cancer Paternal Grandfather   . Alcohol abuse Neg Hx   . Arthritis Neg Hx   . Asthma Neg Hx   . Birth defects Neg Hx   . Cancer Neg Hx   . COPD Neg Hx   . Depression Neg Hx   . Drug abuse Neg Hx   . Early death Neg Hx   . Hearing loss Neg Hx   . Heart disease Neg Hx   . Hyperlipidemia Neg Hx   . Hypertension Neg Hx   . Kidney disease Neg Hx   . Learning disabilities Neg Hx   . Mental illness Neg Hx   . Mental retardation Neg Hx   . Miscarriages / Stillbirths Neg Hx   . Stroke Neg Hx   . Vision loss Neg Hx   . Varicose Veins Neg Hx     Social History   Tobacco Use  . Smoking status: Former Smoker    Packs/day: 0.25    Years:  6.00    Pack years: 1.50    Types: Cigarettes    Last attempt to quit: 01/27/2013    Years since quitting: 4.5  . Smokeless tobacco: Never Used  Substance Use Topics  . Alcohol use: No  . Drug use: Yes    Frequency: 7.0 times per week    Types: Marijuana    Comment: used 08/20/2017    Allergies: No Known Allergies  No medications prior to admission.    Review of Systems  Constitutional: Negative for activity change, diaphoresis, fatigue and fever.  HENT: Negative for congestion.   Gastrointestinal: Positive for abdominal pain. Negative for constipation, diarrhea, nausea, rectal pain and vomiting.  Genitourinary: Negative for dysuria, frequency, urgency and vaginal bleeding.  Neurological: Negative for dizziness.  Psychiatric/Behavioral: Negative for agitation.   Physical Exam   Blood pressure 115/65,  pulse 73, temperature 98.2 F (36.8 C), temperature source Oral, resp. rate 16, height 5\' 4"  (1.626 m), weight 136 lb (61.7 kg), last menstrual period 04/23/2017, unknown if currently breastfeeding.  Physical Exam  Nursing note and vitals reviewed. Constitutional: She is oriented to person, place, and time. She appears well-developed and well-nourished. No distress.  HENT:  Head: Normocephalic.  Eyes: Left eye exhibits no discharge.  Neck: Neck supple.  Cardiovascular: Normal rate.  Respiratory: Effort normal.  GI: Soft. There is tenderness. There is no rebound and no guarding.  Mild tenderness to palpation suprapubic region.  No other abdominal tenderness.  No guarding.  Genitourinary:  Genitourinary Comments: Vaginal exam: Cervix long closed  Musculoskeletal: Normal range of motion.  Neurological: She is alert and oriented to person, place, and time.  Skin: Skin is warm and dry.  Psychiatric: She has a normal mood and affect. Her behavior is normal.   DT FHR 150  MAU Course  Procedures Results for orders placed or performed during the hospital encounter of 08/21/17  (from the past 24 hour(s))  Urinalysis, Routine w reflex microscopic     Status: Abnormal   Collection Time: 08/21/17 11:16 AM  Result Value Ref Range   Color, Urine YELLOW YELLOW   APPearance CLOUDY (A) CLEAR   Specific Gravity, Urine 1.015 1.005 - 1.030   pH 8.0 5.0 - 8.0   Glucose, UA NEGATIVE NEGATIVE mg/dL   Hgb urine dipstick NEGATIVE NEGATIVE   Bilirubin Urine NEGATIVE NEGATIVE   Ketones, ur NEGATIVE NEGATIVE mg/dL   Protein, ur NEGATIVE NEGATIVE mg/dL   Nitrite NEGATIVE NEGATIVE   Leukocytes, UA NEGATIVE NEGATIVE   MDM Discussed RLP/pelvic girdle pain relief measures and reassure no evidence PTL or UTI.   Assessment and Plan   1. Round ligament pain    Allergies as of 08/21/2017   No Known Allergies     Medication List    You have not been prescribed any medications.    Follow-up Information    [facility].   Specialty:  Obstetrics and Gynecology Contact information: [address] [place] [id number] [phone]           Jabria Loos CNM 08/21/2017, 12:25 PM

## 2017-08-27 ENCOUNTER — Encounter (HOSPITAL_COMMUNITY): Payer: Self-pay

## 2017-09-03 ENCOUNTER — Encounter: Payer: Self-pay | Admitting: Obstetrics and Gynecology

## 2017-09-03 ENCOUNTER — Other Ambulatory Visit: Payer: Self-pay | Admitting: Student

## 2017-09-03 ENCOUNTER — Ambulatory Visit (HOSPITAL_COMMUNITY)
Admission: RE | Admit: 2017-09-03 | Discharge: 2017-09-03 | Disposition: A | Discharge status: Home / Self Care | Payer: Self-pay | Source: Ambulatory Visit | Attending: Student | Admitting: Student

## 2017-09-03 DIAGNOSIS — Z3A18 18 weeks gestation of pregnancy: Secondary | ICD-10-CM

## 2017-09-03 DIAGNOSIS — Z3482 Encounter for supervision of other normal pregnancy, second trimester: Secondary | ICD-10-CM

## 2017-09-03 DIAGNOSIS — Z363 Encounter for antenatal screening for malformations: Secondary | ICD-10-CM | POA: Insufficient documentation

## 2017-09-03 DIAGNOSIS — Z3A19 19 weeks gestation of pregnancy: Secondary | ICD-10-CM | POA: Insufficient documentation

## 2017-09-03 DIAGNOSIS — O09292 Supervision of pregnancy with other poor reproductive or obstetric history, second trimester: Secondary | ICD-10-CM

## 2017-09-24 ENCOUNTER — Ambulatory Visit (INDEPENDENT_AMBULATORY_CARE_PROVIDER_SITE_OTHER): Payer: Self-pay | Admitting: Certified Nurse Midwife

## 2017-09-24 ENCOUNTER — Encounter: Payer: Self-pay | Admitting: Certified Nurse Midwife

## 2017-09-24 VITALS — BP 119/68 | HR 68 | Wt 136.2 lb

## 2017-09-24 DIAGNOSIS — Z3482 Encounter for supervision of other normal pregnancy, second trimester: Secondary | ICD-10-CM

## 2017-09-24 NOTE — Progress Notes (Signed)
   PRENATAL VISIT NOTE  Subjective:  Julie Cline is a 22 y.o. G3P1011 at 6549w0d being seen toDonn Pieriniday for ongoing prenatal care.  She is currently monitored for the following issues for this low-risk pregnancy and has Supervision of normal pregnancy and History of placenta abruption on their problem list.  Patient reports no complaints.  Contractions: Not present. Vag. Bleeding: None.  Movement: Present. Denies leaking of fluid.   The following portions of the patient's history were reviewed and updated as appropriate: allergies, current medications, past family history, past medical history, past social history, past surgical history and problem list. Problem list updated.  Objective:   Vitals:   09/24/17 1716  BP: 119/68  Pulse: 68  Weight: 136 lb 3.2 oz (61.8 kg)    Fetal Status: Fetal Heart Rate (bpm): 145 Fundal Height: 20 cm Movement: Present     General:  Alert, oriented and cooperative. Patient is in no acute distress.  Skin: Skin is warm and dry. No rash noted.   Cardiovascular: Normal heart rate noted  Respiratory: Normal respiratory effort, no problems with respiration noted  Abdomen: Soft, gravid, appropriate for gestational age.  Pain/Pressure: Present     Pelvic: Cervical exam deferred        Extremities: Normal range of motion.  Edema: None  Mental Status: Normal mood and affect. Normal behavior. Normal judgment and thought content.   Assessment and Plan:  Pregnancy: G3P1011 at 1949w0d  1. Encounter for supervision of other normal pregnancy in second trimester - Patient doing well, no complaints  - Educated and discussed genetic screening with patient, patient request genetic screening at this time  - US reviewed with patient  - Genetic Screening - AFP, Serum, Open Spina Bifida - Anticipatory guidance on upcoming appointments with GTT and patient needing to come fasting to appointment, patient verbalizes understanding    Preterm labor symptoms and general  obstetric precautions including but not limited to vaginal bleeding, contractions, leaking of fluid and fetal movement were reviewed in detail with the patient. Please refer to After Visit Summary for other counseling recommendations.  Return in about 4 weeks (around 10/22/2017) for ROB/2hrGTT/labs.  Future Appointments  Date Time Provider Department Center  10/24/2017  8:15 AM Rennie Plowmaneill, Caroline M, [facility]M [facility]  10/24/2017  8:50 AM [facility]    Sharyon CableVeronica C Story Conti, CNM

## 2017-09-24 NOTE — Patient Instructions (Addendum)

## 2017-09-26 LAB — AFP, SERUM, OPEN SPINA BIFIDA
AFP MoM: 1.42
AFP Value: 120.2 ng/mL
Gest. Age on Collection Date: 22 weeks
Maternal Age At EDD: 24.7 yr
OSBR Risk 1 IN: 6808
Test Results:: NEGATIVE
Weight: 136 [lb_av]

## 2017-10-01 ENCOUNTER — Encounter: Payer: Self-pay | Admitting: *Deleted

## 2017-10-06 ENCOUNTER — Encounter: Payer: Self-pay | Admitting: *Deleted

## 2017-10-09 ENCOUNTER — Encounter: Payer: Self-pay | Admitting: Advanced Practice Midwife

## 2017-10-21 ENCOUNTER — Other Ambulatory Visit: Payer: Self-pay | Admitting: General Practice

## 2017-10-21 DIAGNOSIS — Z3482 Encounter for supervision of other normal pregnancy, second trimester: Secondary | ICD-10-CM

## 2017-10-24 ENCOUNTER — Other Ambulatory Visit: Payer: Self-pay

## 2017-10-24 ENCOUNTER — Ambulatory Visit (INDEPENDENT_AMBULATORY_CARE_PROVIDER_SITE_OTHER): Payer: Self-pay

## 2017-10-24 VITALS — BP 95/56 | HR 69 | Wt 143.6 lb

## 2017-10-24 DIAGNOSIS — Z3482 Encounter for supervision of other normal pregnancy, second trimester: Secondary | ICD-10-CM

## 2017-10-24 DIAGNOSIS — Z3480 Encounter for supervision of other normal pregnancy, unspecified trimester: Secondary | ICD-10-CM

## 2017-10-24 NOTE — Patient Instructions (Signed)

## 2017-10-24 NOTE — Progress Notes (Signed)
   PRENATAL VISIT NOTE  Subjective:  Julie Cline is a 22 y.o. G3P1011 at [redacted]w[redacted]d being seen today for ongoing prenatal care.  She is currently monitored for the following issues for this high-risk pregnancy and has Supervision of normal pregnancy and History of placenta abruption on their problem list.  Patient reports no complaints.  Contractions: Not present. Vag. Bleeding: None.  Movement: Present. Denies leaking of fluid.   The following portions of the patient's history were reviewed and updated as appropriate: allergies, current medications, past family history, past medical history, past social history, past surgical history and problem list. Problem list updated.  Objective:   Vitals:   10/24/17 0828  BP: (!) 95/56  Pulse: 69  Weight: 143 lb 9.6 oz (65.1 kg)    Fetal Status: Fetal Heart Rate (bpm): 134 Fundal Height: 26 cm Movement: Present     General:  Alert, oriented and cooperative. Patient is in no acute distress.  Skin: Skin is warm and dry. No rash noted.   Cardiovascular: Normal heart rate noted  Respiratory: Normal respiratory effort, no problems with respiration noted  Abdomen: Soft, gravid, appropriate for gestational age.  Pain/Pressure: Absent     Pelvic: Cervical exam deferred        Extremities: Normal range of motion.  Edema: None  Mental Status: Normal mood and affect. Normal behavior. Normal judgment and thought content.   Assessment and Plan:  Pregnancy: G3P1011 at [redacted]w[redacted]d  1. Supervision of other normal pregnancy, antepartum - No complaints. Routine care - Patient was not fasting today, will reschedule GTT for Monday - Decline flu and TDAP  Preterm labor symptoms and general obstetric precautions including but not limited to vaginal bleeding, contractions, leaking of fluid and fetal movement were reviewed in detail with the patient. Please refer to After Visit Summary for other counseling recommendations.  Return in about 2 weeks (around  11/07/2017) for Return OB visit.  Future Appointments  Date Time Provider Department Center  10/24/2017  8:50 AM [facility]  10/27/2017  8:20 AM [facility]    Elisha Headland [place], [place] 10/24/17 8:37 AM

## 2017-10-24 NOTE — Addendum Note (Signed)
Addended by: Osvaldo Human on: 10/24/2017 09:41 AM   Modules accepted: Orders, SmartSet

## 2017-10-25 LAB — CBC
HEMATOCRIT: 33.5 % — AB (ref 34.0–46.6)
Hemoglobin: 11.2 g/dL (ref 11.1–15.9)
MCH: 30.3 pg (ref 26.6–33.0)
MCHC: 33.4 g/dL (ref 31.5–35.7)
MCV: 91 fL (ref 79–97)
PLATELETS: 307 10*3/uL (ref 150–450)
RBC: 3.7 x10E6/uL — ABNORMAL LOW (ref 3.77–5.28)
RDW: 12.8 % (ref 12.3–15.4)
WBC: 7.8 10*3/uL (ref 3.4–10.8)

## 2017-10-25 LAB — HIV ANTIBODY (ROUTINE TESTING W REFLEX): HIV SCREEN 4TH GENERATION: NONREACTIVE

## 2017-10-25 LAB — RPR: RPR: NONREACTIVE

## 2017-10-27 ENCOUNTER — Other Ambulatory Visit: Payer: Self-pay

## 2017-10-27 DIAGNOSIS — Z3480 Encounter for supervision of other normal pregnancy, unspecified trimester: Secondary | ICD-10-CM

## 2017-10-28 LAB — GLUCOSE TOLERANCE, 2 HOURS W/ 1HR
Glucose, 1 hour: 97 mg/dL (ref 65–179)
Glucose, 2 hour: 108 mg/dL (ref 65–152)
Glucose, Fasting: 72 mg/dL (ref 65–91)

## 2017-11-06 ENCOUNTER — Encounter: Payer: Self-pay | Admitting: Obstetrics and Gynecology

## 2017-11-06 ENCOUNTER — Other Ambulatory Visit: Payer: Self-pay

## 2017-11-07 ENCOUNTER — Ambulatory Visit (INDEPENDENT_AMBULATORY_CARE_PROVIDER_SITE_OTHER): Payer: Self-pay | Admitting: Student

## 2017-11-07 VITALS — BP 105/62 | HR 70 | Wt 145.6 lb

## 2017-11-07 DIAGNOSIS — Z3483 Encounter for supervision of other normal pregnancy, third trimester: Secondary | ICD-10-CM

## 2017-11-07 NOTE — Patient Instructions (Signed)
Research childbirth classes and hospital preregistration at [email or web address]  Fetal Movement Counts Patient Name: ________________________________________________ Patient Due Date: ____________________ What is a fetal movement count? A fetal movement count is the number of times that you feel your baby move during a certain amount of time. This may also be called a fetal kick count. A fetal movement count is recommended for every pregnant woman. You may be asked to start counting fetal movements as early as week 28 of your pregnancy. Pay attention to when your baby is most active. You may notice your baby's sleep and wake cycles. You may also notice things that make your baby move more. You should do a fetal movement count:  When your baby is normally most active.  At the same time each day.  A good time to count movements is while you are resting, after having something to eat and drink. How do I count fetal movements? 1. Find a quiet, comfortable area. Sit, or lie down on your side. 2. Write down the date, the start time and stop time, and the number of movements that you felt between those two times. Take this information with you to your health care visits. 3. For 2 hours, count kicks, flutters, swishes, rolls, and jabs. You should feel at least 10 movements during 2 hours. 4. You may stop counting after you have felt 10 movements. 5. If you do not feel 10 movements in 2 hours, have something to eat and drink. Then, keep resting and counting for 1 hour. If you feel at least 4 movements during that hour, you may stop counting. Contact a health care provider if:  You feel fewer than 4 movements in 2 hours.  Your baby is not moving like he or she usually does. Date: ____________ Start time: ____________ Stop time: ____________ Movements: ____________ Date: ____________ Start time: ____________ Stop time: ____________ Movements: ____________ Date: ____________ Start time: ____________  Stop time: ____________ Movements: ____________ Date: ____________ Start time: ____________ Stop time: ____________ Movements: ____________ Date: ____________ Start time: ____________ Stop time: ____________ Movements: ____________ Date: ____________ Start time: ____________ Stop time: ____________ Movements: ____________ Date: ____________ Start time: ____________ Stop time: ____________ Movements: ____________ Date: ____________ Start time: ____________ Stop time: ____________ Movements: ____________ Date: ____________ Start time: ____________ Stop time: ____________ Movements: ____________ This information is not intended to replace advice given to you by your health care provider. Make sure you discuss any questions you have with your health care provider. Document Released: 02/13/2006 Document Revised: 09/13/2015 Document Reviewed: 02/23/2015 Elsevier Interactive Patient Education  2018 [employer]  Braxton Hicks Contractions Contractions of the uterus can occur throughout pregnancy, but they are not always a sign that you are in labor. You may have practice contractions called Braxton Hicks contractions. These false labor contractions are sometimes confused with true labor. What are Braxton Hicks contractions? Braxton Hicks contractions are tightening movements that occur in the muscles of the uterus before labor. Unlike true labor contractions, these contractions do not result in opening (dilation) and thinning of the cervix. Toward the end of pregnancy (32-34 weeks), Braxton Hicks contractions can happen more often and may become stronger. These contractions are sometimes difficult to tell apart from true labor because they can be very uncomfortable. You should not feel embarrassed if you go to the hospital with false labor. Sometimes, the only way to tell if you are in true labor is for your health care provider to look for changes in the cervix. The health care provider will   do a physical  exam and may monitor your contractions. If you are not in true labor, the exam should show that your cervix is not dilating and your water has not broken. If there are other health problems associated with your pregnancy, it is completely safe for you to be sent home with false labor. You may continue to have Braxton Hicks contractions until you go into true labor. How to tell the difference between true labor and false labor True labor  Contractions last 30-70 seconds.  Contractions become very regular.  Discomfort is usually felt in the top of the uterus, and it spreads to the lower abdomen and low back.  Contractions do not go away with walking.  Contractions usually become more intense and increase in frequency.  The cervix dilates and gets thinner. False labor  Contractions are usually shorter and not as strong as true labor contractions.  Contractions are usually irregular.  Contractions are often felt in the front of the lower abdomen and in the groin.  Contractions may go away when you walk around or change positions while lying down.  Contractions get weaker and are shorter-lasting as time goes on.  The cervix usually does not dilate or become thin. Follow these instructions at home:  Take over-the-counter and prescription medicines only as told by your health care provider.  Keep up with your usual exercises and follow other instructions from your health care provider.  Eat and drink lightly if you think you are going into labor.  If Braxton Hicks contractions are making you uncomfortable: ? Change your position from lying down or resting to walking, or change from walking to resting. ? Sit and rest in a tub of warm water. ? Drink enough fluid to keep your urine pale yellow. Dehydration may cause these contractions. ? Do slow and deep breathing several times an hour.  Keep all follow-up prenatal visits as told by your health care provider. This is  important. Contact a health care provider if:  You have a fever.  You have continuous pain in your abdomen. Get help right away if:  Your contractions become stronger, more regular, and closer together.  You have fluid leaking or gushing from your vagina.  You pass blood-tinged mucus (bloody show).  You have bleeding from your vagina.  You have low back pain that you never had before.  You feel your baby's head pushing down and causing pelvic pressure.  Your baby is not moving inside you as much as it used to. Summary  Contractions that occur before labor are called Braxton Hicks contractions, false labor, or practice contractions.  Braxton Hicks contractions are usually shorter, weaker, farther apart, and less regular than true labor contractions. True labor contractions usually become progressively stronger and regular and they become more frequent.  Manage discomfort from Braxton Hicks contractions by changing position, resting in a warm bath, drinking plenty of water, or practicing deep breathing. This information is not intended to replace advice given to you by your health care provider. Make sure you discuss any questions you have with your health care provider. Document Released: 05/30/2016 Document Revised: 05/30/2016 Document Reviewed: 05/30/2016 Elsevier Interactive Patient Education  2018 [employer] Inc.    

## 2017-11-07 NOTE — Progress Notes (Signed)
   PRENATAL VISIT NOTE  Subjective:  Julie Cline is a 22 y.o. G3P1011 at [redacted]w[redacted]d being seen today for ongoing prenatal care.  She is currently monitored for the following issues for this low-risk pregnancy and has Supervision of normal pregnancy and History of placenta abruption on their problem list.  Patient reports no complaints.  Contractions: Not present. Vag. Bleeding: None.  Movement: Present. Denies leaking of fluid.   The following portions of the patient's history were reviewed and updated as appropriate: allergies, current medications, past family history, past medical history, past social history, past surgical history and problem list. Problem list updated.  Objective:   Vitals:   11/07/17 1153  BP: 105/62  Pulse: 70  Weight: 145 lb 9.6 oz (66 kg)    Fetal Status: Fetal Heart Rate (bpm): 142 Fundal Height: 28 cm Movement: Present     General:  Alert, oriented and cooperative. Patient is in no acute distress.  Skin: Skin is warm and dry. No rash noted.   Cardiovascular: Normal heart rate noted  Respiratory: Normal respiratory effort, no problems with respiration noted  Abdomen: Soft, gravid, appropriate for gestational age.  Pain/Pressure: Absent     Pelvic: Cervical exam deferred        Extremities: Normal range of motion.  Edema: None  Mental Status: Normal mood and affect. Normal behavior. Normal judgment and thought content.   Assessment and Plan:  Pregnancy: G3P1011 at [redacted]w[redacted]d  1. Encounter for supervision of other normal pregnancy in third trimester -Doing well -Declines flu/Tdap - Enroll patient in Babyscripts Program  Preterm labor symptoms and general obstetric precautions including but not limited to vaginal bleeding, contractions, leaking of fluid and fetal movement were reviewed in detail with the patient. Please refer to After Visit Summary for other counseling recommendations.  Return in about 2 weeks (around 11/21/2017) for Routine OB.  No future  appointments.  Judeth Horn, NP

## 2017-11-12 ENCOUNTER — Encounter (HOSPITAL_COMMUNITY): Payer: Self-pay

## 2017-11-12 ENCOUNTER — Other Ambulatory Visit: Payer: Self-pay

## 2017-11-12 ENCOUNTER — Inpatient Hospital Stay (HOSPITAL_COMMUNITY)
Admission: AD | Admit: 2017-11-12 | Discharge: 2017-11-12 | Disposition: A | Discharge status: Home / Self Care | Payer: Medicaid Other | Source: Ambulatory Visit | Attending: Obstetrics and Gynecology | Admitting: Obstetrics and Gynecology

## 2017-11-12 DIAGNOSIS — N760 Acute vaginitis: Secondary | ICD-10-CM | POA: Diagnosis not present

## 2017-11-12 DIAGNOSIS — Z79899 Other long term (current) drug therapy: Secondary | ICD-10-CM | POA: Insufficient documentation

## 2017-11-12 DIAGNOSIS — Z3A29 29 weeks gestation of pregnancy: Secondary | ICD-10-CM

## 2017-11-12 DIAGNOSIS — O26893 Other specified pregnancy related conditions, third trimester: Secondary | ICD-10-CM | POA: Insufficient documentation

## 2017-11-12 DIAGNOSIS — B9689 Other specified bacterial agents as the cause of diseases classified elsewhere: Secondary | ICD-10-CM

## 2017-11-12 DIAGNOSIS — Z9889 Other specified postprocedural states: Secondary | ICD-10-CM | POA: Insufficient documentation

## 2017-11-12 DIAGNOSIS — Z833 Family history of diabetes mellitus: Secondary | ICD-10-CM | POA: Diagnosis not present

## 2017-11-12 DIAGNOSIS — M549 Dorsalgia, unspecified: Secondary | ICD-10-CM | POA: Insufficient documentation

## 2017-11-12 DIAGNOSIS — Z87891 Personal history of nicotine dependence: Secondary | ICD-10-CM | POA: Insufficient documentation

## 2017-11-12 DIAGNOSIS — O9989 Other specified diseases and conditions complicating pregnancy, childbirth and the puerperium: Secondary | ICD-10-CM

## 2017-11-12 LAB — WET PREP, GENITAL
SPERM: NONE SEEN
Trich, Wet Prep: NONE SEEN
YEAST WET PREP: NONE SEEN

## 2017-11-12 LAB — URINALYSIS, ROUTINE W REFLEX MICROSCOPIC
Bilirubin Urine: NEGATIVE
GLUCOSE, UA: NEGATIVE mg/dL
Hgb urine dipstick: NEGATIVE
Ketones, ur: NEGATIVE mg/dL
Nitrite: NEGATIVE
PROTEIN: NEGATIVE mg/dL
SPECIFIC GRAVITY, URINE: 1.024 (ref 1.005–1.030)
pH: 6 (ref 5.0–8.0)

## 2017-11-12 MED ORDER — METRONIDAZOLE 500 MG PO TABS: 500.0000 mg | ORAL_TABLET | Freq: Two times a day (BID) | ORAL | 0 refills | Status: DC

## 2017-11-12 NOTE — MAU Note (Signed)
Been having this certain smell and some low back pain.  Doesn't know if she has an infection or what, she just know something isn't right.  Been going on for about a wk.  Saw a lot of d/c one day, none since

## 2017-11-12 NOTE — MAU Provider Note (Signed)
History     CSN: [id number]  Arrival date and time: 11/12/17 1544   First Provider Initiated Contact with Patient 11/12/17 1620      Chief Complaint  Patient presents with  . Back Pain  . vag odor   HPI   [name] is a 22 y.o. female G33P1011 @ [redacted]w[redacted]d here in MAU with an increase in vaginal discharge with an odor since 10/10. This is a new complaint. The discharge is constant. The discharge is dark white and smells fishy and strong. She has not tried any medications OTC.    OB History    Gravida  3   Para  1   Term  1   Preterm  0   AB  1   Living  1     SAB  1   TAB  0   Ectopic  0   Multiple  0   Live Births  1           Past Medical History:  Diagnosis Date  . Chlamydia   . Gonorrhea 07/05/2016   To go to HD for tx  . Urinary tract infection     Past Surgical History:  Procedure Laterality Date  . EYE SURGERY     Eye lid  . HERNIA REPAIR      Family History  Problem Relation Age of Onset  . Diabetes Maternal Uncle   . Prostate cancer Paternal Grandfather   . Alcohol abuse Neg Hx   . Arthritis Neg Hx   . Asthma Neg Hx   . Birth defects Neg Hx   . Cancer Neg Hx   . COPD Neg Hx   . Depression Neg Hx   . Drug abuse Neg Hx   . Early death Neg Hx   . Hearing loss Neg Hx   . Heart disease Neg Hx   . Hyperlipidemia Neg Hx   . Hypertension Neg Hx   . Kidney disease Neg Hx   . Learning disabilities Neg Hx   . Mental illness Neg Hx   . Mental retardation Neg Hx   . Miscarriages / Stillbirths Neg Hx   . Stroke Neg Hx   . Vision loss Neg Hx   . Varicose Veins Neg Hx     Social History   Tobacco Use  . Smoking status: Former Smoker    Packs/day: 0.25    Years: 6.00    Pack years: 1.50    Types: Cigarettes    Last attempt to quit: 01/27/2013    Years since quitting: 4.7  . Smokeless tobacco: Never Used  Substance Use Topics  . Alcohol use: No  . 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 Prenatal MV-Min-FA-Omega-3 (PRENATAL GUMMIES/DHA & FA PO) Take by mouth.   Taking   Results for orders placed or performed during the hospital encounter of 11/12/17 (from the past 48 hour(s))  Urinalysis, Routine w reflex microscopic     Status: Abnormal   Collection Time: 11/12/17  4:23 PM  Result Value Ref Range   Color, Urine YELLOW YELLOW   APPearance CLEAR CLEAR   Specific Gravity, Urine 1.024 1.005 - 1.030   pH 6.0 5.0 - 8.0   Glucose, UA NEGATIVE NEGATIVE mg/dL   Hgb urine dipstick NEGATIVE NEGATIVE  Bilirubin Urine NEGATIVE NEGATIVE   Ketones, ur NEGATIVE NEGATIVE mg/dL   Protein, ur NEGATIVE NEGATIVE mg/dL   Nitrite NEGATIVE NEGATIVE   Leukocytes, UA TRACE (A) NEGATIVE   RBC / HPF 0-5 0 - 5 RBC/hpf   WBC, UA 0-5 0 - 5 WBC/hpf   Bacteria, UA RARE (A) NONE SEEN   Squamous Epithelial / LPF 0-5 0 - 5   Mucus PRESENT     Comment: Performed at [facility], [address], [place], [place] [address]  Wet prep, genital     Status: Abnormal   Collection Time: 11/12/17  4:28 PM  Result Value Ref Range   Yeast Wet Prep HPF POC NONE SEEN NONE SEEN   Trich, Wet Prep NONE SEEN NONE SEEN   Clue Cells Wet Prep HPF POC PRESENT (A) NONE SEEN   WBC, Wet Prep HPF POC MANY (A) NONE SEEN    Comment: MANY BACTERIA SEEN   Sperm NONE SEEN     Comment: Performed at [facility], [address], [place], [place] [address]   Review of Systems  Constitutional: Negative for fever.  Gastrointestinal: Negative for abdominal pain.  Genitourinary: Negative for dysuria, hematuria and urgency.   Physical Exam   Blood pressure 120/65, pulse 73, temperature 98.6 F (37 C), temperature source Oral, resp. rate 17, weight 67.5 kg, last menstrual period 04/23/2017, SpO2 100 %, unknown if currently breastfeeding.  Physical Exam  Constitutional: She is  oriented to person, place, and time. She appears well-developed and well-nourished. No distress.  HENT:  Head: Normocephalic.  Genitourinary:  Genitourinary Comments: Wet prep and GC collected without speculum   Musculoskeletal: Normal range of motion.  Neurological: She is alert and oriented to person, place, and time.  Skin: Skin is warm. She is not diaphoretic.  Psychiatric: Her behavior is normal.   Fetal Tracing: Baseline: 130 bpm Variability: Moderate  Accelerations: 15x15 Decelerations: None Toco: None  MAU Course  Procedures  None  MDM  Wet prep and GC UA  Assessment and Plan   A:  1. BV (bacterial vaginosis)   2. [redacted] weeks gestation of pregnancy     P:  Discharge home in stable condition Rx: Flagyl Return to MAU if symptoms worsen, for emergencies only Condoms always.    Venia Carbon I, NP 11/12/2017 5:07 PM

## 2017-11-12 NOTE — Discharge Instructions (Signed)

## 2017-11-13 LAB — GC/CHLAMYDIA PROBE AMP (~~LOC~~) NOT AT ARMC
Chlamydia: NEGATIVE
NEISSERIA GONORRHEA: NEGATIVE

## 2017-11-17 IMAGING — US US MFM OB LIMITED
1 series · 15 of 28 positions shown · non-contrast
Comparison: none

[Series 1: us mfm ob limited · 29 acquisitions, 15 frames shown]
[im 1/29]
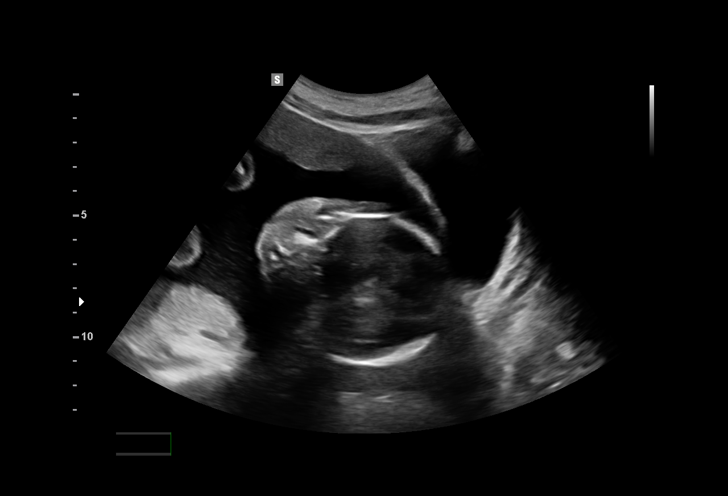
[im 3/29]
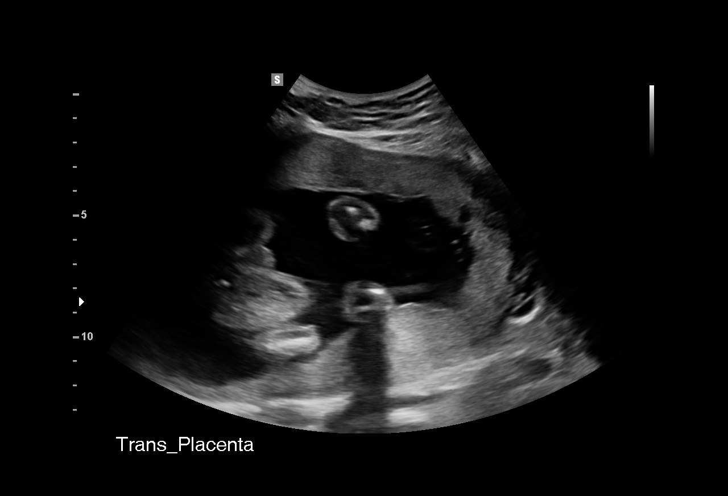
[im 5/29]
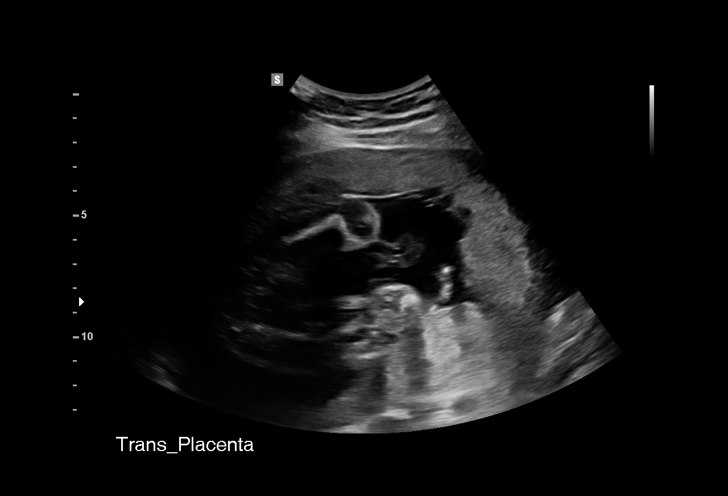
[im 7/29]
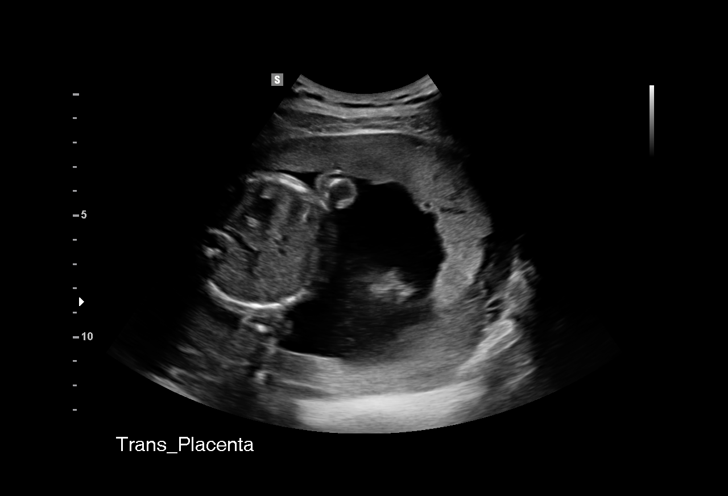
[im 9/29]
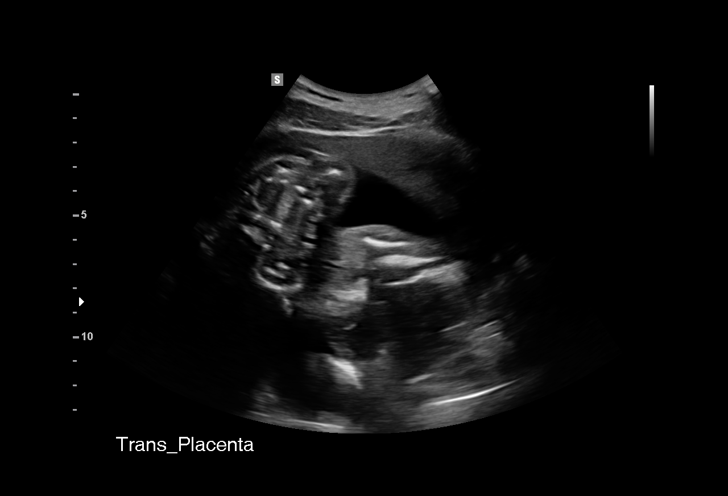
[im 11/29]
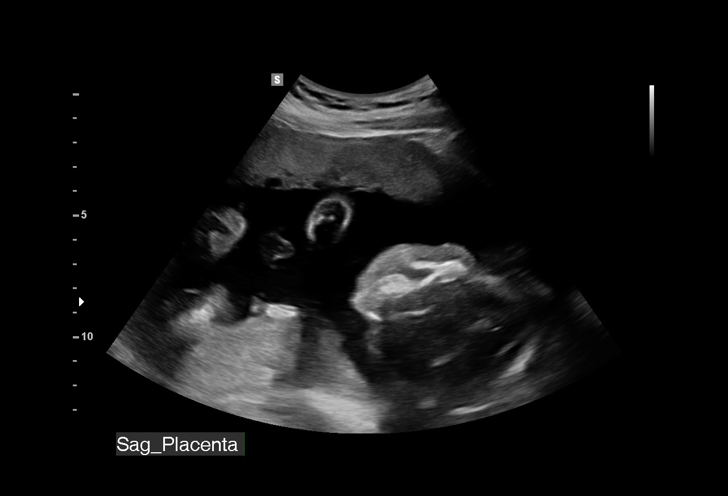
[im 13/29]
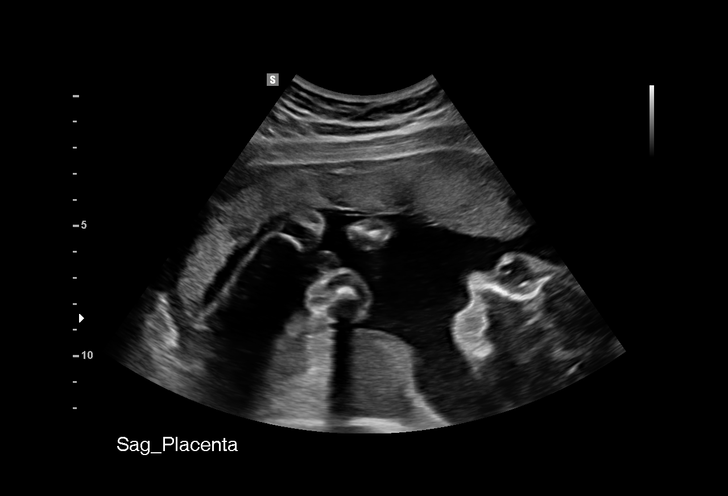
[im 15/29]
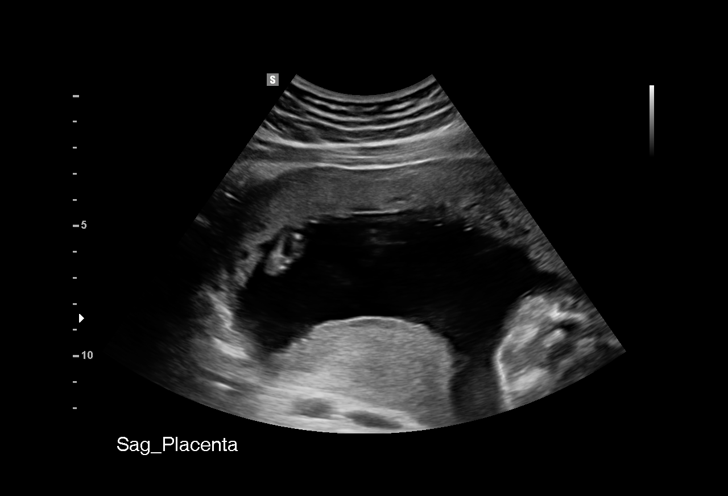
[im 16/29]
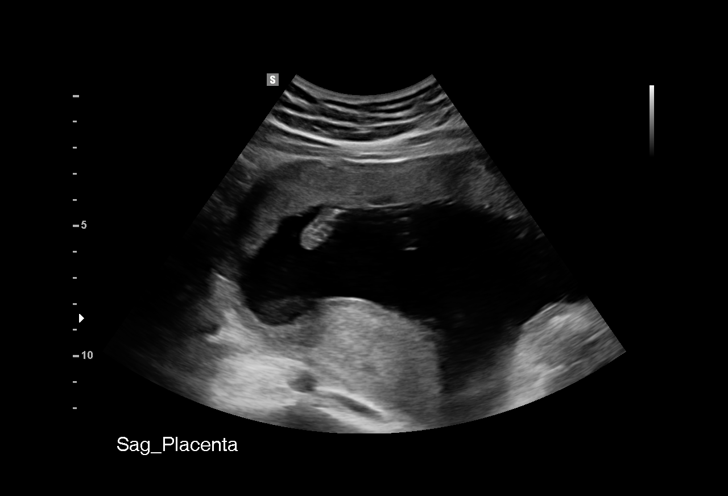
[im 18/29]
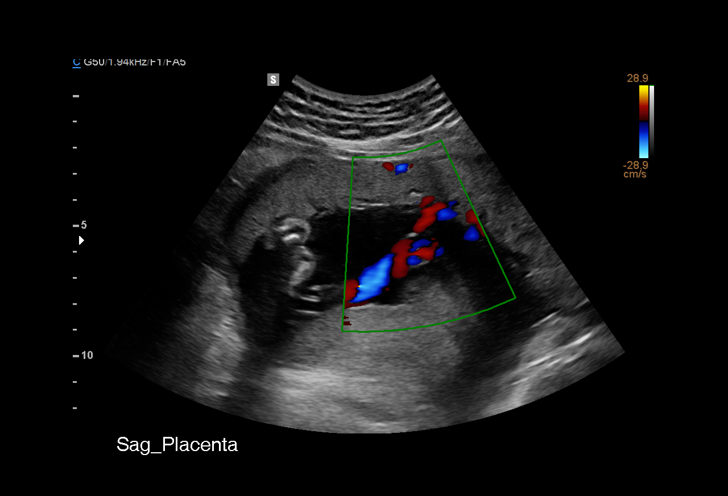
[im 20/29]
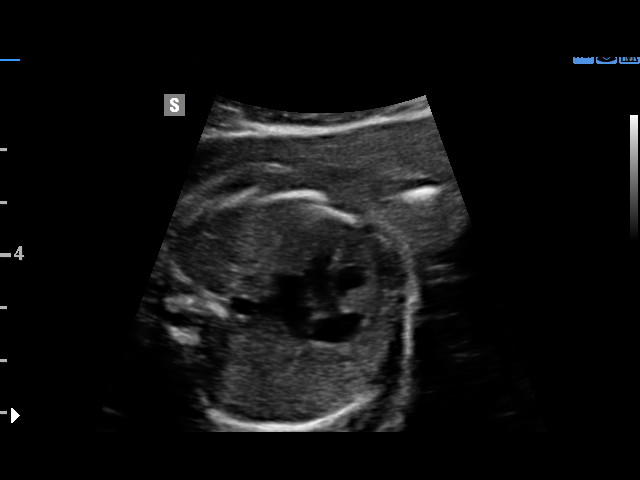
[im 22/29]
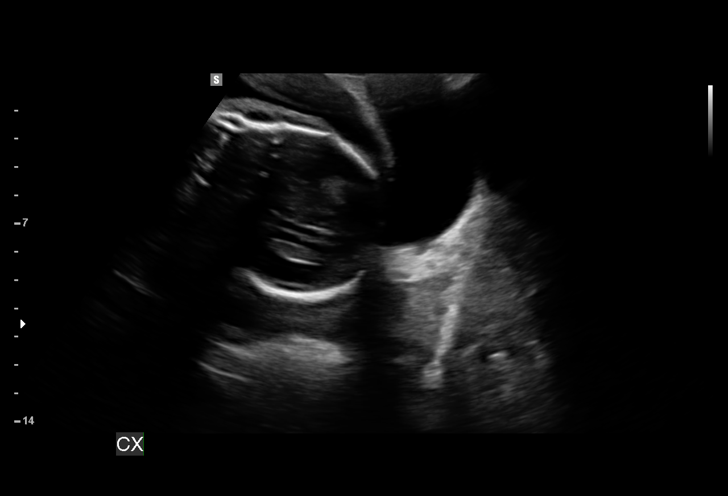
[im 24/29]
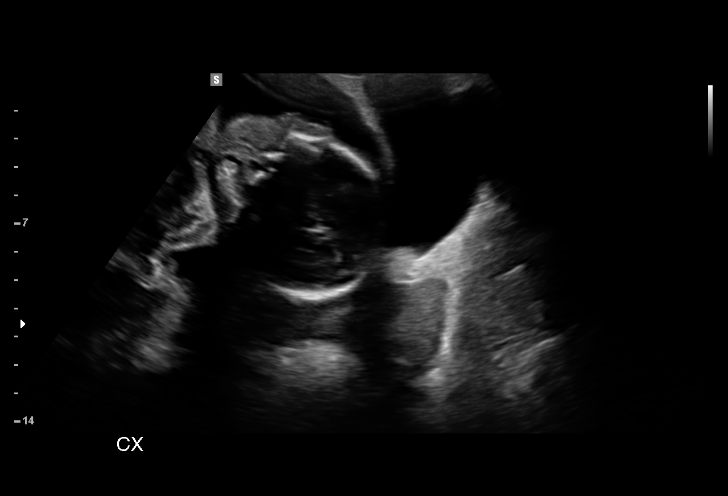
[im 26/29]
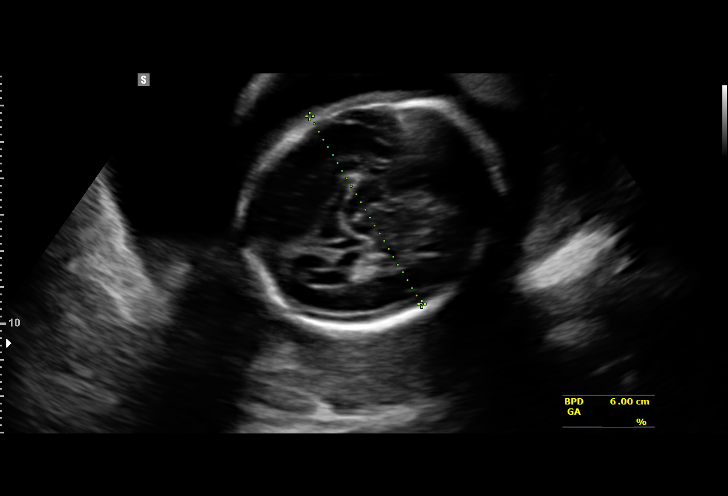
[im 29/29]
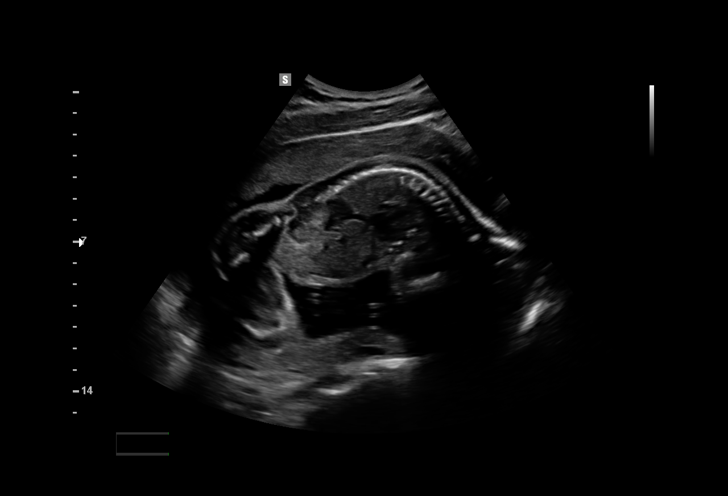

[15 of 28 positions shown; findings below may reference images not displayed]

1  QIZIL GUL TACIROGLU                [id number]      [id number]     [id number]
Indications

24 weeks gestation of pregnancy
Vaginal bleeding in pregnancy, second
trimester
Insufficient Prenatal Care
OB History

Gravidity:    1         Term:   0        Prem:   0         SAB:   0
TOP:          0       Ectopic:  0        Living: 0
Fetal Evaluation

Num Of Fetuses:     1
Fetal Heart         141
Rate(bpm):
Cardiac Activity:   Observed
Presentation:       Cephalic
Placenta:           Anterior, above cervical os
P. Cord Insertion:  Visualized, central

Amniotic Fluid
AFI FV:      Subjectively within normal limits

Largest Pocket(cm)
7.0
Biometry

BPD:        60  mm     G. Age:  24w 4d         55  %
Gestational Age

LMP:           24w 1d        Date:  01/18/15                 EDD:    10/25/15
U/S Today:     24w 4d                                        EDD:    10/22/15
Best:          24w 1d     Det. By:  LMP  (01/18/15)          EDD:    10/25/15
Cervix Uterus Adnexa

Cervix
Length:            3.1  cm.
Normal appearance by transabdominal scan.

Uterus
No abnormality visualized.

Left Ovary
No adnexal mass visualized.

Right Ovary
No adnexal mass visualized.

Cul De Sac:   No free fluid seen.

Adnexa:       No abnormality visualized.
Impression

SIUP at 24+1 weeks
Normal amniotic fluid volume
Anterior placenta; no previa; no subchorionic fluid
collections/hemorrhage
Recommendations

Follow-up as clinically indicated

## 2017-11-20 ENCOUNTER — Encounter: Payer: Self-pay | Admitting: Obstetrics and Gynecology

## 2017-11-21 ENCOUNTER — Encounter: Payer: Self-pay | Admitting: Student

## 2017-11-21 ENCOUNTER — Telehealth: Payer: Self-pay | Admitting: Student

## 2017-11-21 NOTE — Telephone Encounter (Signed)
Called patient to get her rescheduled for her missed appt on 11/21/17. Pt answer, when I asked to speak with her she hung up. Called again and no answer and the VM was not setup to leave a message. Mailed no-show letter and new appt on 12/02/17@ 10:55.

## 2017-12-02 ENCOUNTER — Ambulatory Visit (INDEPENDENT_AMBULATORY_CARE_PROVIDER_SITE_OTHER): Payer: Self-pay | Admitting: Obstetrics and Gynecology

## 2017-12-02 VITALS — BP 112/66 | HR 78 | Wt 154.0 lb

## 2017-12-02 DIAGNOSIS — Z3483 Encounter for supervision of other normal pregnancy, third trimester: Secondary | ICD-10-CM

## 2017-12-02 MED ORDER — COMFORT FIT MATERNITY SUPP LG MISC: 1.0000 | Freq: Every day | 0 refills | Status: DC | PRN

## 2017-12-02 NOTE — Progress Notes (Signed)
   PRENATAL VISIT NOTE  Subjective:  Julie Cline is a 22 y.o. G3P1011 at [redacted]w[redacted]d being seen today for ongoing prenatal care.  She is currently monitored for the following issues for this low-risk pregnancy and has Supervision of normal pregnancy and History of placenta abruption on their problem list.  Patient reports no complaints.  Contractions: Not present. Vag. Bleeding: None.   . Denies leaking of fluid.   The following portions of the patient's history were reviewed and updated as appropriate: allergies, current medications, past family history, past medical history, past social history, past surgical history and problem list. Problem list updated.  Objective:   Vitals:   12/02/17 1124  BP: 112/66  Pulse: 78  Weight: 154 lb (69.9 kg)    Fetal Status: Fetal Heart Rate (bpm): 139         General:  Alert, oriented and cooperative. Patient is in no acute distress.  Skin: Skin is warm and dry. No rash noted.   Cardiovascular: Normal heart rate noted  Respiratory: Normal respiratory effort, no problems with respiration noted  Abdomen: Soft, gravid, appropriate for gestational age.  Pain/Pressure: Absent     Pelvic: Cervical exam deferred        Extremities: Normal range of motion.  Edema: None  Mental Status: Normal mood and affect. Normal behavior. Normal judgment and thought content.   Assessment and Plan:  Pregnancy: G3P1011 at [redacted]w[redacted]d  1. Encounter for supervision of other normal pregnancy in third trimester  - Some round ligament pain - RX for maternity belt.  - 2 hour GTT normal     There are no diagnoses linked to this encounter. Preterm labor symptoms and general obstetric precautions including but not limited to vaginal bleeding, contractions, leaking of fluid and fetal movement were reviewed in detail with the patient. Please refer to After Visit Summary for other counseling recommendations.  Return in about 2 weeks (around 12/16/2017).  No future  appointments.  Venia Carbon, NP

## 2017-12-02 NOTE — Patient Instructions (Signed)

## 2017-12-04 ENCOUNTER — Encounter: Payer: Self-pay | Admitting: Internal Medicine

## 2017-12-18 ENCOUNTER — Ambulatory Visit (INDEPENDENT_AMBULATORY_CARE_PROVIDER_SITE_OTHER): Payer: Self-pay | Admitting: Obstetrics and Gynecology

## 2017-12-18 VITALS — BP 108/60 | HR 69 | Wt 159.0 lb

## 2017-12-18 DIAGNOSIS — B9689 Other specified bacterial agents as the cause of diseases classified elsewhere: Secondary | ICD-10-CM

## 2017-12-18 DIAGNOSIS — Z3A34 34 weeks gestation of pregnancy: Secondary | ICD-10-CM

## 2017-12-18 DIAGNOSIS — N760 Acute vaginitis: Secondary | ICD-10-CM

## 2017-12-18 DIAGNOSIS — Z3483 Encounter for supervision of other normal pregnancy, third trimester: Secondary | ICD-10-CM

## 2017-12-18 MED ORDER — CLINDAMYCIN HCL 300 MG PO CAPS: 300.0000 mg | ORAL_CAPSULE | Freq: Two times a day (BID) | ORAL | 0 refills | Status: DC

## 2017-12-18 NOTE — Progress Notes (Signed)
   PRENATAL VISIT NOTE  Subjective:  Julie Cline is a 22 y.o. G3P1011 at 5352w1d being seen today for ongoing prenatal care.  She is currently monitored for the following issues for this low-risk pregnancy and has Supervision of normal pregnancy; History of placenta abruption; and BV (bacterial vaginosis) on their problem list.  Patient reports no complaints.  Contractions: Not present. Vag. Bleeding: None.  Movement: Present. Denies leaking of fluid.   The following portions of the patient's history were reviewed and updated as appropriate: allergies, current medications, past family history, past medical history, past social history, past surgical history and problem list. Problem list updated.  Objective:   Vitals:   12/18/17 1053  BP: 108/60  Pulse: 69  Weight: 159 lb (72.1 kg)    Fetal Status: Fetal Heart Rate (bpm): 154 Fundal Height: 35 cm Movement: Present     General:  Alert, oriented and cooperative. Patient is in no acute distress.  Skin: Skin is warm and dry. No rash noted.   Cardiovascular: Normal heart rate noted  Respiratory: Normal respiratory effort, no problems with respiration noted  Abdomen: Soft, gravid, appropriate for gestational age.  Pain/Pressure: Absent     Pelvic: Cervical exam deferred        Extremities: Normal range of motion.  Edema: None  Mental Status: Normal mood and affect. Normal behavior. Normal judgment and thought content.   Assessment and Plan:  Pregnancy: G3P1011 at 6252w1d  1. BV (bacterial vaginosis)  She was treated in MAU on 10/16, did not take all of the flagyl. Would like to try an alternative treatment. Rx: Clindamycin; good RX coupon provided.   2. Encounter for supervision of other normal pregnancy in third trimester  - Doing well - GBS next visit.    There are no diagnoses linked to this encounter. Preterm labor symptoms and general obstetric precautions including but not limited to vaginal bleeding, contractions,  leaking of fluid and fetal movement were reviewed in detail with the patient. Please refer to After Visit Summary for other counseling recommendations.  Return in about 2 weeks (around 01/01/2018).  No future appointments.  Venia CarbonJennifer Nielle Duford, NP

## 2018-01-01 ENCOUNTER — Other Ambulatory Visit (HOSPITAL_COMMUNITY)
Admission: RE | Admit: 2018-01-01 | Discharge: 2018-01-01 | Disposition: A | Discharge status: Home / Self Care | Payer: Medicaid Other | Source: Ambulatory Visit | Attending: Pediatric Endocrinology | Admitting: Pediatric Endocrinology

## 2018-01-01 ENCOUNTER — Ambulatory Visit (INDEPENDENT_AMBULATORY_CARE_PROVIDER_SITE_OTHER): Payer: Self-pay | Admitting: Obstetrics and Gynecology

## 2018-01-01 VITALS — BP 119/66 | HR 77 | Wt 162.1 lb

## 2018-01-01 DIAGNOSIS — Z3483 Encounter for supervision of other normal pregnancy, third trimester: Secondary | ICD-10-CM | POA: Diagnosis present

## 2018-01-01 DIAGNOSIS — Z3A36 36 weeks gestation of pregnancy: Secondary | ICD-10-CM

## 2018-01-01 LAB — OB RESULTS CONSOLE GBS: GBS: NEGATIVE

## 2018-01-01 MED ORDER — HYDROXYZINE PAMOATE 25 MG PO CAPS: 25.0000 mg | ORAL_CAPSULE | Freq: Every evening | ORAL | 0 refills | Status: DC | PRN

## 2018-01-01 NOTE — Progress Notes (Signed)
   PRENATAL VISIT NOTE  Subjective:  Julie Cline is a 22 y.o. G3P1011 at 6247w1d being seen today for ongoing prenatal care.  She is currently monitored for the following issues for this low-risk pregnancy and has Supervision of normal pregnancy; History of placenta abruption; and BV (bacterial vaginosis) on their problem list.  Patient reports no complaints.  Contractions: Not present. Vag. Bleeding: None.  Movement: Present. Denies leaking of fluid.   The following portions of the patient's history were reviewed and updated as appropriate: allergies, current medications, past family history, past medical history, past social history, past surgical history and problem list. Problem list updated.  Objective:   Vitals:   01/01/18 1048  BP: 119/66  Pulse: 77  Weight: 162 lb 1.6 oz (73.5 kg)    Fetal Status: Fetal Heart Rate (bpm): 131   Movement: Present     General:  Alert, oriented and cooperative. Patient is in no acute distress.  Skin: Skin is warm and dry. No rash noted.   Cardiovascular: Normal heart rate noted  Respiratory: Normal respiratory effort, no problems with respiration noted  Abdomen: Soft, gravid, appropriate for gestational age.  Pain/Pressure: Present     Pelvic: Cervical exam deferred        Extremities: Normal range of motion.  Edema: Trace  Mental Status: Normal mood and affect. Normal behavior. Normal judgment and thought content.   Assessment and Plan:  Pregnancy: G3P1011 at 4147w1d  1. Encounter for supervision of other normal pregnancy in third trimester  - Culture, beta strep (group b only) - Cervicovaginal ancillary only( [place])  There are no diagnoses linked to this encounter. Preterm labor symptoms and general obstetric precautions including but not limited to vaginal bleeding, contractions, leaking of fluid and fetal movement were reviewed in detail with the patient. Please refer to After Visit Summary for other counseling recommendations.   No follow-ups on file.  Future Appointments  Date Time Provider Department Center  01/08/2018  9:35 AM Rasch, Harolyn RutherfordJennifer I, NP [facility]  01/16/2018  9:15 AM Allie Bossierove, Myra C, MD [facility]  01/22/2018 10:35 AM Allie Bossierove, Myra C, [facility]    Venia CarbonJennifer Rasch, NP

## 2018-01-02 LAB — CERVICOVAGINAL ANCILLARY ONLY
BACTERIAL VAGINITIS: POSITIVE — AB
CANDIDA VAGINITIS: NEGATIVE
Chlamydia: NEGATIVE
Neisseria Gonorrhea: NEGATIVE
Trichomonas: NEGATIVE

## 2018-01-05 ENCOUNTER — Inpatient Hospital Stay (HOSPITAL_COMMUNITY)
Admission: AD | Admit: 2018-01-05 | Discharge: 2018-01-07 | DRG: 807 | Disposition: A | Discharge status: Home / Self Care | Payer: Medicaid Other | Attending: Obstetrics & Gynecology | Admitting: Obstetrics & Gynecology

## 2018-01-05 ENCOUNTER — Encounter (HOSPITAL_COMMUNITY): Payer: Self-pay | Admitting: Anesthesiology

## 2018-01-05 ENCOUNTER — Encounter (HOSPITAL_COMMUNITY): Payer: Self-pay | Admitting: *Deleted

## 2018-01-05 ENCOUNTER — Other Ambulatory Visit: Payer: Self-pay

## 2018-01-05 DIAGNOSIS — O42913 Preterm premature rupture of membranes, unspecified as to length of time between rupture and onset of labor, third trimester: Principal | ICD-10-CM | POA: Diagnosis present

## 2018-01-05 DIAGNOSIS — Z87891 Personal history of nicotine dependence: Secondary | ICD-10-CM

## 2018-01-05 DIAGNOSIS — N760 Acute vaginitis: Secondary | ICD-10-CM

## 2018-01-05 DIAGNOSIS — Z3A36 36 weeks gestation of pregnancy: Secondary | ICD-10-CM | POA: Diagnosis not present

## 2018-01-05 DIAGNOSIS — O4202 Full-term premature rupture of membranes, onset of labor within 24 hours of rupture: Secondary | ICD-10-CM

## 2018-01-05 DIAGNOSIS — B9689 Other specified bacterial agents as the cause of diseases classified elsewhere: Secondary | ICD-10-CM

## 2018-01-05 DIAGNOSIS — Z8751 Personal history of pre-term labor: Secondary | ICD-10-CM

## 2018-01-05 LAB — TYPE AND SCREEN
ABO/RH(D): A POS
Antibody Screen: NEGATIVE

## 2018-01-05 LAB — CBC
HCT: 35.5 % — ABNORMAL LOW (ref 36.0–46.0)
Hemoglobin: 11.4 g/dL — ABNORMAL LOW (ref 12.0–15.0)
MCH: 29.2 pg (ref 26.0–34.0)
MCHC: 32.1 g/dL (ref 30.0–36.0)
MCV: 90.8 fL (ref 80.0–100.0)
PLATELETS: 277 10*3/uL (ref 150–400)
RBC: 3.91 MIL/uL (ref 3.87–5.11)
RDW: 13.3 % (ref 11.5–15.5)
WBC: 7.4 10*3/uL (ref 4.0–10.5)
nRBC: 0 % (ref 0.0–0.2)

## 2018-01-05 LAB — URINALYSIS, ROUTINE W REFLEX MICROSCOPIC
Bilirubin Urine: NEGATIVE
GLUCOSE, UA: NEGATIVE mg/dL
KETONES UR: NEGATIVE mg/dL
LEUKOCYTES UA: NEGATIVE
Nitrite: NEGATIVE
Protein, ur: NEGATIVE mg/dL
Specific Gravity, Urine: 1.01 (ref 1.005–1.030)
pH: 8 (ref 5.0–8.0)

## 2018-01-05 LAB — CULTURE, BETA STREP (GROUP B ONLY): STREP GP B CULTURE: NEGATIVE

## 2018-01-05 LAB — POCT FERN TEST: POCT FERN TEST: POSITIVE

## 2018-01-05 MED ORDER — LIDOCAINE HCL (PF) 1 % IJ SOLN
30.0000 mL | INTRAMUSCULAR | Status: DC | PRN
  Filled 2018-01-05: qty 30

## 2018-01-05 MED ORDER — OXYTOCIN 40 UNITS IN LACTATED RINGERS INFUSION - SIMPLE MED: 2.5000 [IU]/h | INTRAVENOUS | Status: DC | PRN

## 2018-01-05 MED ORDER — ACETAMINOPHEN 325 MG PO TABS: 650.0000 mg | ORAL_TABLET | ORAL | Status: DC | PRN

## 2018-01-05 MED ORDER — BENZOCAINE-MENTHOL 20-0.5 % EX AERO: 1.0000 "application " | INHALATION_SPRAY | CUTANEOUS | Status: DC | PRN

## 2018-01-05 MED ORDER — FENTANYL CITRATE (PF) 100 MCG/2ML IJ SOLN
100.0000 ug | INTRAMUSCULAR | Status: DC | PRN
  Administered 2018-01-05 (×3): 100 ug via INTRAVENOUS
  Filled 2018-01-05 (×3): qty 2

## 2018-01-05 MED ORDER — ACETAMINOPHEN 325 MG PO TABS
650.0000 mg | ORAL_TABLET | ORAL | Status: DC | PRN
  Administered 2018-01-06: 650 mg via ORAL
  Filled 2018-01-05: qty 2

## 2018-01-05 MED ORDER — BETAMETHASONE SOD PHOS & ACET 6 (3-3) MG/ML IJ SUSP
12.0000 mg | INTRAMUSCULAR | Status: DC
  Administered 2018-01-05: 12 mg via INTRAMUSCULAR
  Filled 2018-01-05: qty 2

## 2018-01-05 MED ORDER — SIMETHICONE 80 MG PO CHEW: 80.0000 mg | CHEWABLE_TABLET | ORAL | Status: DC | PRN

## 2018-01-05 MED ORDER — OXYTOCIN 40 UNITS IN LACTATED RINGERS INFUSION - SIMPLE MED
2.5000 [IU]/h | INTRAVENOUS | Status: DC
  Administered 2018-01-05: 2.5 [IU]/h via INTRAVENOUS
  Filled 2018-01-05: qty 1000

## 2018-01-05 MED ORDER — SENNOSIDES-DOCUSATE SODIUM 8.6-50 MG PO TABS
2.0000 | ORAL_TABLET | ORAL | Status: DC
  Administered 2018-01-06: 2 via ORAL
  Filled 2018-01-05 (×2): qty 2

## 2018-01-05 MED ORDER — MISOPROSTOL 50MCG HALF TABLET
50.0000 ug | ORAL_TABLET | ORAL | Status: DC | PRN
  Administered 2018-01-05: 50 ug via BUCCAL
  Filled 2018-01-05 (×2): qty 1

## 2018-01-05 MED ORDER — IBUPROFEN 600 MG PO TABS
600.0000 mg | ORAL_TABLET | Freq: Four times a day (QID) | ORAL | Status: DC
  Administered 2018-01-06 – 2018-01-07 (×7): 600 mg via ORAL
  Filled 2018-01-05 (×7): qty 1

## 2018-01-05 MED ORDER — SODIUM CHLORIDE 0.9 % IV SOLN
5.0000 10*6.[IU] | Freq: Once | INTRAVENOUS | Status: AC
  Administered 2018-01-05: 5 10*6.[IU] via INTRAVENOUS
  Filled 2018-01-05: qty 5

## 2018-01-05 MED ORDER — PENICILLIN G 3 MILLION UNITS IVPB - SIMPLE MED
3.0000 10*6.[IU] | INTRAVENOUS | Status: DC
  Administered 2018-01-05: 3 10*6.[IU] via INTRAVENOUS
  Filled 2018-01-05: qty 100

## 2018-01-05 MED ORDER — LACTATED RINGERS IV SOLN: 500.0000 mL | Freq: Once | INTRAVENOUS | Status: DC

## 2018-01-05 MED ORDER — DIPHENHYDRAMINE HCL 50 MG/ML IJ SOLN: 12.5000 mg | INTRAMUSCULAR | Status: DC | PRN

## 2018-01-05 MED ORDER — LACTATED RINGERS IV SOLN
INTRAVENOUS | Status: DC
  Administered 2018-01-05 (×2): via INTRAVENOUS

## 2018-01-05 MED ORDER — PHENYLEPHRINE 40 MCG/ML (10ML) SYRINGE FOR IV PUSH (FOR BLOOD PRESSURE SUPPORT)
80.0000 ug | PREFILLED_SYRINGE | INTRAVENOUS | Status: DC | PRN
  Filled 2018-01-05: qty 10

## 2018-01-05 MED ORDER — WITCH HAZEL-GLYCERIN EX PADS: 1.0000 "application " | MEDICATED_PAD | CUTANEOUS | Status: DC | PRN

## 2018-01-05 MED ORDER — OXYCODONE-ACETAMINOPHEN 5-325 MG PO TABS
2.0000 | ORAL_TABLET | ORAL | Status: DC | PRN
  Administered 2018-01-05: 2 via ORAL
  Filled 2018-01-05: qty 2

## 2018-01-05 MED ORDER — FLEET ENEMA 7-19 GM/118ML RE ENEM: 1.0000 | ENEMA | RECTAL | Status: DC | PRN

## 2018-01-05 MED ORDER — SOD CITRATE-CITRIC ACID 500-334 MG/5ML PO SOLN: 30.0000 mL | ORAL | Status: DC | PRN

## 2018-01-05 MED ORDER — OXYTOCIN 40 UNITS IN LACTATED RINGERS INFUSION - SIMPLE MED
1.0000 m[IU]/min | INTRAVENOUS | Status: DC
  Administered 2018-01-05: 2 m[IU]/min via INTRAVENOUS

## 2018-01-05 MED ORDER — COCONUT OIL OIL: 1.0000 "application " | TOPICAL_OIL | Status: DC | PRN

## 2018-01-05 MED ORDER — PRENATAL MULTIVITAMIN CH
1.0000 | ORAL_TABLET | Freq: Every day | ORAL | Status: DC
  Administered 2018-01-06 – 2018-01-07 (×2): 1 via ORAL
  Filled 2018-01-05 (×2): qty 1

## 2018-01-05 MED ORDER — DIBUCAINE 1 % RE OINT: 1.0000 "application " | TOPICAL_OINTMENT | RECTAL | Status: DC | PRN

## 2018-01-05 MED ORDER — OXYCODONE-ACETAMINOPHEN 5-325 MG PO TABS: 1.0000 | ORAL_TABLET | ORAL | Status: DC | PRN

## 2018-01-05 MED ORDER — ZOLPIDEM TARTRATE 5 MG PO TABS: 5.0000 mg | ORAL_TABLET | Freq: Every evening | ORAL | Status: DC | PRN

## 2018-01-05 MED ORDER — DIPHENHYDRAMINE HCL 25 MG PO CAPS: 25.0000 mg | ORAL_CAPSULE | Freq: Four times a day (QID) | ORAL | Status: DC | PRN

## 2018-01-05 MED ORDER — ONDANSETRON HCL 4 MG/2ML IJ SOLN: 4.0000 mg | INTRAMUSCULAR | Status: DC | PRN

## 2018-01-05 MED ORDER — FENTANYL 2.5 MCG/ML BUPIVACAINE 1/10 % EPIDURAL INFUSION (WH - ANES)
14.0000 mL/h | INTRAMUSCULAR | Status: DC | PRN
  Filled 2018-01-05: qty 100

## 2018-01-05 MED ORDER — EPHEDRINE 5 MG/ML INJ
10.0000 mg | INTRAVENOUS | Status: DC | PRN
  Filled 2018-01-05: qty 2

## 2018-01-05 MED ORDER — MISOPROSTOL 50MCG HALF TABLET: 50.0000 ug | ORAL_TABLET | ORAL | Status: DC

## 2018-01-05 MED ORDER — PRENATAL GUMMIES/DHA & FA 0.4-32.5 MG PO CHEW: 1.0000 | CHEWABLE_TABLET | Freq: Every day | ORAL | Status: DC

## 2018-01-05 MED ORDER — LACTATED RINGERS IV SOLN: 500.0000 mL | INTRAVENOUS | Status: DC | PRN

## 2018-01-05 MED ORDER — ONDANSETRON HCL 4 MG PO TABS: 4.0000 mg | ORAL_TABLET | ORAL | Status: DC | PRN

## 2018-01-05 MED ORDER — OXYTOCIN BOLUS FROM INFUSION
500.0000 mL | Freq: Once | INTRAVENOUS | Status: AC
  Administered 2018-01-05: 500 mL/h via INTRAVENOUS

## 2018-01-05 MED ORDER — ONDANSETRON HCL 4 MG/2ML IJ SOLN: 4.0000 mg | Freq: Four times a day (QID) | INTRAMUSCULAR | Status: DC | PRN

## 2018-01-05 MED ORDER — TERBUTALINE SULFATE 1 MG/ML IJ SOLN
0.2500 mg | Freq: Once | INTRAMUSCULAR | Status: DC | PRN
  Filled 2018-01-05: qty 1

## 2018-01-05 NOTE — Progress Notes (Signed)
LABOR PROGRESS NOTE  Julie Cline is a 22 y.o. G3P1011 at 6267w5d  admitted for IOL following PPROM.  Subjective: Resting comfortably through foley placement.  Objective: BP 117/72   Pulse 74   Temp 98.2 F (36.8 C) (Oral)   Resp 16   Ht 5\' 6"  (1.676 m)   Wt 74.2 kg   LMP 04/23/2017   SpO2 100%   BMI 26.39 kg/m  or  Vitals:   01/05/18 1058 01/05/18 1109 01/05/18 1248 01/05/18 1432  BP: 121/68  (!) 85/64 117/72  Pulse: 81  70 74  Resp: 18  18 16   Temp: 97.9 F (36.6 C)  98.6 F (37 C) 98.2 F (36.8 C)  TempSrc: Oral  Oral Oral  SpO2:      Weight:  74.2 kg    Height:  5\' 6"  (1.676 m)      Dilation: 1 Presentation: Vertex Exam by:: Natoria Archibald FHT: baseline rate 120s, moderate variability, +acel, -decel Toco: Q3-694min  Labs: Lab Results  Component Value Date   WBC 7.4 01/05/2018   HGB 11.4 (L) 01/05/2018   HCT 35.5 (L) 01/05/2018   MCV 90.8 01/05/2018   PLT 277 01/05/2018    Patient Active Problem List   Diagnosis Date Noted  . Normal labor 01/05/2018  . BV (bacterial vaginosis) 12/18/2017  . Supervision of normal pregnancy 04/30/2016  . History of placenta abruption 04/30/2016    Assessment / Plan: 22 y.o. G3P1011 at 6367w5d here for IOL following PPROM.  Labor: Not in active labor. Continue misoprostol, foley in place. Fetal Wellbeing:  Tolerating well at this time. Pain Control:  Fentanyl PRN. Epidural desired once pain becomes more intense. Anticipated MOD:  Vaginal  Julie AgresteSarah Asman Daphnee Preiss, MD PGY-1 Family Medicine Resident 01/05/2018, 3:22 PM

## 2018-01-05 NOTE — Anesthesia Preprocedure Evaluation (Deleted)
Anesthesia Evaluation  Patient identified by MRN, date of birth, ID band Patient awake    Reviewed: Allergy & Precautions, Patient's Chart, lab work & pertinent test results  Airway Mallampati: II  TM Distance: >3 FB Neck ROM: Full    Dental no notable dental hx. (+) Teeth Intact   Pulmonary former smoker,    Pulmonary exam normal breath sounds clear to auscultation       Cardiovascular negative cardio ROS Normal cardiovascular exam Rhythm:Regular Rate:Normal     Neuro/Psych negative neurological ROS  negative psych ROS   GI/Hepatic Neg liver ROS, GERD  ,  Endo/Other  negative endocrine ROS  Renal/GU negative Renal ROS  negative genitourinary   Musculoskeletal negative musculoskeletal ROS (+)   Abdominal   Peds  Hematology  (+) anemia ,   Anesthesia Other Findings   Reproductive/Obstetrics (+) Pregnancy Hx/o STDs                             Anesthesia Physical Anesthesia Plan  ASA: II  Anesthesia Plan: General   Post-op Pain Management:    Induction: Intravenous  PONV Risk Score and Plan:   Airway Management Planned: Natural Airway  Additional Equipment:   Intra-op Plan:   Post-operative Plan:   Informed Consent: I have reviewed the patients History and Physical, chart, labs and discussed the procedure including the risks, benefits and alternatives for the proposed anesthesia with the patient or authorized representative who has indicated his/her understanding and acceptance.     Plan Discussed with: Anesthesiologist  Anesthesia Plan Comments: (Patient delivered before procedure could be performed. No Procedure was performed.)       Anesthesia Quick Evaluation

## 2018-01-05 NOTE — MAU Note (Signed)
Presents with c/o "water broke' @ 0930, reports clear fluid.  Denies VB.  Reports +FM.

## 2018-01-05 NOTE — Anesthesia Pain Management Evaluation Note (Signed)
  CRNA Pain Management Visit Note  Patient: Julie Cline, 22 y.o., female  "[facility] We have an anesthesia team available at all times to provide care throughout the hospital, including epidural management and anesthesia for C-section. I don't know your plan for the delivery whether it a natural birth, water birth, IV sedation, nitrous supplementation, doula or epidural, but we want to meet your pain goals."   1.Was your pain managed to your expectations on prior hospitalizations?   Yes   2.What is your expectation for pain management during this hospitalization?     Epidural  3.How can we help you reach that goal? epidural  Record the patient's initial score and the patient's pain goal.   Pain: 3  Pain Goal: 5 [facility] wants you to be able to say your pain was always managed very well.  [name] 01/05/2018

## 2018-01-05 NOTE — Progress Notes (Signed)
Fabian NovemberM. Bhambri, CNM into perform bedside U/S, fetus vertex.

## 2018-01-05 NOTE — H&P (Addendum)
OBSTETRIC ADMISSION HISTORY AND PHYSICAL  Julie Cline is a 22 y.o. female G3P1011 with IUP at 7321w5d presenting for IOL following PPROM. She reports +FMs. No LOF, VB, blurry vision, headaches, peripheral edema, or RUQ pain. She plans on formula feeding. She is undecided on birth control but has mentioned depo outpatient.  Spontaneous ROM this morning with prompt presentation to the MAU following this. Prior delivery over 2 years ago with IOL for post-dates and no other complications.   Dating: By L/5 --->  Estimated Date of Delivery: 01/28/18  Sono:   @[redacted]w[redacted]d , CWD, normal anatomy, cephalic presentation, 304g, 55%ile   Prenatal History/Complications: None History of placental abruption - in outpt chart, patient denies history of pregnancy complications in this or prior pregnancy  Past Medical History: Past Medical History:  Diagnosis Date  . Chlamydia   . Gonorrhea 07/05/2016   To go to HD for tx  . Urinary tract infection     Past Surgical History: Past Surgical History:  Procedure Laterality Date  . EYE SURGERY     Eye lid  . HERNIA REPAIR      Obstetrical History: OB History    Gravida  3   Para  1   Term  1   Preterm  0   AB  1   Living  1     SAB  1   TAB  0   Ectopic  0   Multiple  0   Live Births  1           Social History: Social History   Socioeconomic History  . Marital status: Single    Spouse name: Not on file  . Number of children: Not on file  . Years of education: Not on file  . Highest education level: Not on file  Occupational History  . Not on file  Social Needs  . Financial resource strain: Not on file  . Food insecurity:    Worry: Not on file    Inability: Not on file  . Transportation needs:    Medical: Not on file    Non-medical: Not on file  Tobacco Use  . Smoking status: Former Smoker    Packs/day: 0.25    Years: 6.00    Pack years: 1.50    Types: Cigarettes    Last attempt to quit: 01/27/2013    Years  since quitting: 4.9  . Smokeless tobacco: Never Used  Substance and Sexual Activity  . Alcohol use: No  . Drug use: Yes    Frequency: 7.0 times per week    Types: Marijuana    Comment: used 08/20/2017  . Sexual activity: Yes    Birth control/protection: None  Lifestyle  . Physical activity:    Days per week: Not on file    Minutes per session: Not on file  . Stress: Not on file  Relationships  . Social connections:    Talks on phone: Not on file    Gets together: Not on file    Attends religious service: Not on file    Active member of club or organization: Not on file    Attends meetings of clubs or organizations: Not on file    Relationship status: Not on file  Other Topics Concern  . Not on file  Social History Narrative  . Not on file    Family History: Family History  Problem Relation Age of Onset  . Diabetes Maternal Uncle   . Prostate cancer Paternal Grandfather   .  Alcohol abuse Neg Hx   . Arthritis Neg Hx   . Asthma Neg Hx   . Birth defects Neg Hx   . Cancer Neg Hx   . COPD Neg Hx   . Depression Neg Hx   . Drug abuse Neg Hx   . Early death Neg Hx   . Hearing loss Neg Hx   . Heart disease Neg Hx   . Hyperlipidemia Neg Hx   . Hypertension Neg Hx   . Kidney disease Neg Hx   . Learning disabilities Neg Hx   . Mental illness Neg Hx   . Mental retardation Neg Hx   . Miscarriages / Stillbirths Neg Hx   . Stroke Neg Hx   . Vision loss Neg Hx   . Varicose Veins Neg Hx     Allergies: No Known Allergies  Medications Prior to Admission  Medication Sig Dispense Refill Last Dose  . Prenatal MV-Min-FA-Omega-3 (PRENATAL GUMMIES/DHA & FA PO) Take 2 each by mouth daily.    Past Month at Unknown time  . acetaminophen (TYLENOL) 500 MG tablet Take 500 mg by mouth every 6 (six) hours as needed for mild pain.   Taking  . clindamycin (CLEOCIN) 300 MG capsule Take 1 capsule (300 mg total) by mouth 2 (two) times daily. (Patient not taking: Reported on 01/01/2018) 14  capsule 0 Not Taking  . Elastic Bandages & Supports (COMFORT FIT MATERNITY SUPP LG) MISC 1 each by Does not apply route daily as needed. (Patient not taking: Reported on 01/01/2018) 1 each 0 Not Taking  . hydrOXYzine (VISTARIL) 25 MG capsule Take 1 capsule (25 mg total) by mouth at bedtime as needed. Can increase to 50 mg if needed. (Patient not taking: Reported on 01/05/2018) 30 capsule 0 Not Taking at Unknown time     Review of Systems   All systems reviewed and negative except as stated in HPI  Blood pressure 121/68, pulse 81, temperature 97.9 F (36.6 C), temperature source Oral, resp. rate 18, height 5\' 6"  (1.676 m), weight 74.2 kg, last menstrual period 04/23/2017, SpO2 100 %, unknown if currently breastfeeding. General appearance: alert, cooperative and no distress Lungs: regular rate and effort Heart: regular rate  Abdomen: soft, non-tender Extremities: Homans sign is negative, no sign of DVT Presentation: cephalic Fetal monitoringBaseline: 135 bpm, Variability: Good {> 6 bpm), Accelerations: Reactive and Decelerations: Absent Uterine activity: Irregular uterine irritability with mild discomfort.     Prenatal labs: ABO, Rh: --/--/A POS (12/09 1100) Antibody: PENDING (12/09 1100) Rubella: 1.00 (06/12 0947) RPR: Non Reactive (09/27 0829)  HBsAg: Negative (06/12 0947)  HIV: Non Reactive (09/27 0829)  GBS: Negative (12/05 1059)  2 hr GTT Normal  Prenatal Transfer Tool  Maternal Diabetes: No Genetic Screening: Normal Maternal Ultrasounds/Referrals: Normal Fetal Ultrasounds or other Referrals:  None Maternal Substance Abuse:  No Significant Maternal Medications:  None Significant Maternal Lab Results: None  Results for orders placed or performed during the hospital encounter of 01/05/18 (from the past 24 hour(s))  POCT fern test   Collection Time: 01/05/18 10:30 AM  Result Value Ref Range   POCT Fern Test Positive = ruptured amniotic membanes   CBC   Collection Time:  01/05/18 11:00 AM  Result Value Ref Range   WBC 7.4 4.0 - 10.5 K/uL   RBC 3.91 3.87 - 5.11 MIL/uL   Hemoglobin 11.4 (L) 12.0 - 15.0 g/dL   HCT 16.1 (L) 09.6 - 04.5 %   MCV 90.8 80.0 - 100.0 fL  MCH 29.2 26.0 - 34.0 pg   MCHC 32.1 30.0 - 36.0 g/dL   RDW 40.9 81.1 - 91.4 %   Platelets 277 150 - 400 K/uL   nRBC 0.0 0.0 - 0.2 %  Type and screen [facility]   Collection Time: 01/05/18 11:00 AM  Result Value Ref Range   ABO/RH(D) A POS    Antibody Screen PENDING    Sample Expiration      01/08/2018 Performed at [facility], [address] [place], [place] [address]     Patient Active Problem List   Diagnosis Date Noted  . Normal labor 01/05/2018  . BV (bacterial vaginosis) 12/18/2017  . Supervision of normal pregnancy 04/30/2016  . History of placenta abruption 04/30/2016    Assessment: Julie Cline is a 22 y.o. G3P1011 at [redacted]w[redacted]d here for IOL in the setting of PPROM.  1. Labor: Not in active labor. 2. FWB: Tolerating ROM well without contractions at this time. 3. Pain: None at this time. 4. GBS: Negative   Plan: 1. Labor: Initiating misoprostol with intention to place foley. 2. FWB: Reactive strip. Continue to monitor. 3. Pain: Epidural desired once labor progresses. 4. GBS: Negative, however initiated PCN due to prematurity. 5. Prematurity: BMZ ordered.     Awilda Metro, MD  01/05/2018, 12:30 PM  OB FELLOW HISTORY AND PHYSICAL ATTESTATION  I have seen and examined this patient; I agree with above documentation in the resident's note.   Gwenevere Abbot, MD  OB Fellow  01/05/2018, 9:37 PM

## 2018-01-06 LAB — RPR: RPR Ser Ql: NONREACTIVE

## 2018-01-06 MED ORDER — OXYCODONE HCL 5 MG PO TABS: 5.0000 mg | ORAL_TABLET | ORAL | Status: DC | PRN

## 2018-01-06 NOTE — Discharge Summary (Signed)
Postpartum Discharge Summary     Patient Name: Julie Cline DOB: May 25, 1995 MRN: [id number]  Date of admission: 01/05/2018 Delivering Provider: Gwenevere AbbotPHILLIP, Jamee Pacholski   Date of discharge: 01/07/2018  Admitting diagnosis: 37wks, water broke, CTX Intrauterine pregnancy: 3880w5d     Secondary diagnosis:  Active Problems:   Normal labor   History of preterm delivery  Additional problems: PPROM     Discharge diagnosis: Preterm Pregnancy Delivered                                                                                                Post partum procedures:None  Augmentation: Pitocin, Cytotec and Foley Balloon  Complications: None  Hospital course:  Induction of Labor With Vaginal Delivery   22 y.o. yo [id number] at 4980w5d was admitted to the hospital 01/05/2018 for induction of labor.  Indication for induction: PROM.  Patient had an uncomplicated labor course as follows: Membrane Rupture Time/Date: 9:30 AM ,01/05/2018   Intrapartum Procedures: Episiotomy: None [1]                                         Lacerations:  None [1];1st degree [2];Perineal [11]  Patient had delivery of a Viable infant.  Information for the patient's newborn:  Jon GillsMcKinney, Girl Gloriana [[id number]][[id number]]  Delivery Method: Vaginal, Spontaneous(Filed from Delivery Summary)   01/05/2018  Details of delivery can be found in separate delivery note.  Patient had a routine postpartum course. Patient is discharged home 01/07/18.  Magnesium Sulfate recieved: No BMZ received: Yes  Physical exam  Vitals:   01/06/18 1025 01/06/18 1450 01/06/18 2304 01/07/18 0618  BP: 119/65 111/69 (!) 102/51 106/62  Pulse: 71 66 72 67  Resp: 18 18 18 18   Temp: 97.8 F (36.6 C) 98.1 F (36.7 C) 98.4 F (36.9 C) 97.9 F (36.6 C)  TempSrc: Oral Oral Oral Oral  SpO2:    97%  Weight:      Height:       General: alert, cooperative and no distress Lochia: appropriate Uterine Fundus: firm Incision: N/A DVT Evaluation: No evidence of  DVT seen on physical exam. Negative Homan's sign. No cords or calf tenderness. No significant calf/ankle edema. Labs: Lab Results  Component Value Date   WBC 7.4 01/05/2018   HGB 11.4 (L) 01/05/2018   HCT 35.5 (L) 01/05/2018   MCV 90.8 01/05/2018   PLT 277 01/05/2018   CMP Latest Ref Rng & Units 03/06/2016  Glucose 65 - 99 mg/dL 578(I102(H)  BUN 6 - 20 mg/dL 5(L)  Creatinine 6.960.44 - 1.00 mg/dL 2.950.73  Sodium 284135 - 132145 mmol/L 134(L)  Potassium 3.5 - 5.1 mmol/L 3.4(L)  Chloride 101 - 111 mmol/L 100(L)  CO2 22 - 32 mmol/L 24  Calcium 8.9 - 10.3 mg/dL 9.5  Total Protein 6.5 - 8.1 g/dL 8.1  Total Bilirubin 0.3 - 1.2 mg/dL 0.3  Alkaline Phos 38 - 126 U/L 52  AST 15 - 41 U/L 25  ALT 14 - 54 U/L 21  Discharge instruction: per After Visit Summary and "Baby and Me Booklet".  After visit meds:  Allergies as of 01/07/2018   No Known Allergies     Medication List    TAKE these medications   acetaminophen 500 MG tablet Commonly known as:  TYLENOL Take 500 mg by mouth every 6 (six) hours as needed for mild pain.   clindamycin 300 MG capsule Commonly known as:  CLEOCIN Take 1 capsule (300 mg total) by mouth 2 (two) times daily.   COMFORT FIT MATERNITY SUPP LG Misc 1 each by Does not apply route daily as needed.   hydrOXYzine 25 MG capsule Commonly known as:  VISTARIL Take 1 capsule (25 mg total) by mouth at bedtime as needed. Can increase to 50 mg if needed.   ibuprofen 600 MG tablet Commonly known as:  ADVIL,MOTRIN Take 1 tablet (600 mg total) by mouth every 6 (six) hours.   PRENATAL GUMMIES/DHA & FA PO Take 2 each by mouth daily.       Diet: routine diet  Activity: Advance as tolerated. Pelvic rest for 6 weeks.   Outpatient follow up:4 weeks Follow up Appt: Future Appointments  Date Time Provider Department Center  02/10/2018  3:35 PM Calvert Cantor, CNM [facility]   Follow up Visit:   Please schedule this patient for Postpartum visit in: 4 weeks with the  following provider: Any provider For C/S patients schedule nurse incision check in weeks 2 weeks: no Low risk pregnancy complicated by: None Delivery mode:  SVD Anticipated Birth Control:  Depo  PP Procedures needed: None  Schedule Integrated BH visit: no  Newborn Data: Live born female  Birth Weight: 6 lb 1.5 oz (2764 g) APGAR: 9, 9  Newborn Delivery   Birth date/time:  01/05/2018 19:31:00 Delivery type:  Vaginal, Spontaneous     Baby Feeding: Bottle Disposition:home with mother   01/07/2018 Gwenevere Abbot, MD

## 2018-01-06 NOTE — Progress Notes (Signed)
CLINICAL SOCIAL WORK MATERNAL/CHILD NOTE  Patient Details  Name: Julie Cline MRN: [id number] Date of Birth: 01/05/2020  Date:  01/06/2018  Clinical Social Worker Initiating Note:  Celso Sickle, [place]   Date/Time: Initiated:  01/06/18/1453             Child's Name:  Julie Cline   Biological Parents:  Mother, Father(Father - Nancy Nordmann)   Need for Interpreter:  None   Reason for Referral:  Current Substance Use/Substance Use During Pregnancy    Address:  [address] [place] [address]    Phone number:  [phone] (home)     Additional phone number:   Household Members/Support Persons (HM/SP):   Household Member/Support Person 1, Household Member/Support Person 2   HM/SP Name Relationship DOB or Age  HM/SP -1 Jockel Murphy FOB   HM/SP -2 Alan Mulder son 11/01/2015  HM/SP -3     HM/SP -4     HM/SP -5     HM/SP -6     HM/SP -7     HM/SP -8       Natural Supports (not living in the home): Parent, Extended Family   Professional Supports:None   Employment:Part-time   Type of Work: [employer])   Education:  9 to 11 years   Homebound arranged: No  Financial Resources:Self-Pay    Other Resources: [employer]   Cultural/Religious Considerations Which May Impact Care:   Strengths: Ability to meet basic needs , Pediatrician chosen, Home prepared for child    Psychotropic Medications:         Pediatrician:    [employer] area  Pediatrician List:   [facility] ([address] [employer])  [place]   [place]     Pediatrician Fax Number:    Risk Factors/Current Problems: Substance Use    Cognitive State: Able to Concentrate , Alert , Linear Thinking    Mood/Affect: Calm , Relaxed    CSW Assessment:CSW spoke with MOB at bedside, FOB present. MOB granted CSW permission to speak in front  of FOB. CSW introduced self and explained reason for consult. MOB reported that she resides with FOB and older child. MOB reported that she has everything needed to care for the baby. CSW inquired about MOB's support system, MOB reported that her mother and FOB's mother are her supports.   CSW informed MOB about hospital drug policy and inquired about substance use. MOB reported that she smoked marijuana about 3 months ago, unable to recall exact date. CSW informed MOB that UDS was negative and that CDS was still pending, MOB verbalized understanding. CSW inquired about MOB mental health history, MOB reported none and no post partum depression with first child.   CSW provided education regarding the baby blues period vs. perinatal mood disorders, discussed treatment and gave resources for mental health follow up if concerns arise.  CSW recommends self-evaluation during the postpartum time period using the New Mom Checklist from Postpartum Progress and encouraged MOB to contact a medical professional if symptoms are noted at any time.    CSW provided review of Sudden Infant Death Syndrome (SIDS) precautions.    CSW identifies no further need for intervention and no barriers to discharge at this time.  CSW Plan/Description: No Further Intervention Required/No Barriers to Discharge, Perinatal Mood and Anxiety Disorder (PMADs) Education, Sudden Infant Death Syndrome (SIDS) Education, Hospital Drug Screen Policy Information, CSW Will Continue  to Monitor Umbilical Cord Tissue Drug Screen Results and Make Report if Jamelle RushingWarranted    Kojo Liby L Apolonio Cutting, LCSW 01/06/2018, 2:58 PM

## 2018-01-06 NOTE — Progress Notes (Signed)
Post Partum Day 1 Subjective: Doing well. States wasn't able to sleep because of contraction pain and wondering if there is anything else she could try. Has been up and moving around without difficulty.   Objective: Blood pressure 111/75, pulse 67, temperature 98 F (36.7 C), temperature source Oral, resp. rate 16, height 5\' 6"  (1.676 m), weight 74.2 kg, last menstrual period 04/23/2017, SpO2 100 %, unknown if currently breastfeeding.  Physical Exam:  General: sitting up in bed, NAD, holding infant  Lochia: appropriate Uterine Fundus: firm Incision: n/a DVT Evaluation: No significant calf/ankle edema.  Recent Labs    01/05/18 1100  HGB 11.4*  HCT 35.5*    Assessment/Plan: Plan for discharge tomorrow  Routine postpartum care Will given prn oxycodone to help with pain   LOS: 1 day   Julie Cline S Saidee Geremia 01/06/2018, 10:09 AM

## 2018-01-07 DIAGNOSIS — Z8751 Personal history of pre-term labor: Secondary | ICD-10-CM

## 2018-01-07 MED ORDER — IBUPROFEN 600 MG PO TABS: 600.0000 mg | ORAL_TABLET | Freq: Four times a day (QID) | ORAL | 0 refills | Status: DC

## 2018-01-08 ENCOUNTER — Encounter: Payer: Self-pay | Admitting: Obstetrics and Gynecology

## 2018-01-16 ENCOUNTER — Encounter: Payer: Self-pay | Admitting: Obstetrics & Gynecology

## 2018-01-22 ENCOUNTER — Encounter: Payer: Self-pay | Admitting: Obstetrics & Gynecology

## 2018-02-10 ENCOUNTER — Ambulatory Visit (INDEPENDENT_AMBULATORY_CARE_PROVIDER_SITE_OTHER): Payer: Medicaid Other | Admitting: Advanced Practice Midwife

## 2018-02-10 ENCOUNTER — Encounter: Payer: Self-pay | Admitting: Advanced Practice Midwife

## 2018-02-10 DIAGNOSIS — Z30017 Encounter for initial prescription of implantable subdermal contraceptive: Secondary | ICD-10-CM

## 2018-02-10 DIAGNOSIS — Z1389 Encounter for screening for other disorder: Secondary | ICD-10-CM

## 2018-02-10 LAB — POCT PREGNANCY, URINE: Preg Test, Ur: NEGATIVE

## 2018-02-10 MED ORDER — ETONOGESTREL 68 MG ~~LOC~~ IMPL
68.0000 mg | DRUG_IMPLANT | Freq: Once | SUBCUTANEOUS | Status: AC
  Administered 2018-02-10: 68 mg via SUBCUTANEOUS

## 2018-02-10 NOTE — Progress Notes (Signed)
Subjective:     Julie Cline is a 23 y.o. female who presents for a postpartum visit. She is 6 weeks postpartum following a spontaneous vaginal delivery over intact perineum. I have fully reviewed the prenatal and intrapartum course. The delivery was at 36.5 gestational weeks. Outcome: spontaneous vaginal delivery. Anesthesia: none. Postpartum course has been unremarkable. Baby's course has been unremarkable. Baby is feeding by bottle - Lucien Mons Start. Bleeding no bleeding. Bowel function is normal. Bladder function is normal. Patient is not sexually active. Contraception method is Nexplanon. Postpartum depression screening: negative.  The following portions of the patient's history were reviewed and updated as appropriate: allergies, current medications, past family history, past medical history, past social history, past surgical history and problem list.  Review of Systems Pertinent items noted in HPI and remainder of comprehensive ROS otherwise negative.   Objective:    LMP 04/23/2017   General:  alert, cooperative and appears stated age   Breasts:  inspection negative, no nipple discharge or bleeding, no masses or nodularity palpable  Abdomen: soft, non-tender; bowel sounds normal; no masses,  no organomegaly   Vulva:  normal  Vagina: normal vagina  Cervix:  deferred  Rectal Exam: Not performed.        Assessment:    Normal postpartum exam. Pap smear not done at today's visit.    Nexplanon Insertion Procedure Patient identified, informed consent performed, consent signed.   Patient does understand that irregular bleeding is a very common side effect of this medication. She was advised to have backup contraception for one week after placement. Pregnancy test in clinic today was negative.  Appropriate time out taken.  Patient's left arm was prepped and draped in the usual sterile fashion. The ruler used to measure and mark insertion area.  Patient was prepped with alcohol swab and  then injected with 3 ml of 1% lidocaine.  She was prepped with betadine, Nexplanon removed from packaging,  Device confirmed in needle, then inserted full length of needle and withdrawn per handbook instructions. Nexplanon was able to palpated in the patient's arm; patient palpated the insert herself. There was minimal blood loss.  Patient insertion site covered with guaze and a pressure bandage to reduce any bruising.  The patient tolerated the procedure well and was given post procedure instructions.     Plan:    1. Contraception: Nexplanon   2. Follow up in: 1 year for well woman exam  Clayton Bibles, CNM [facility] for [employer], [facility]

## 2018-02-10 NOTE — Progress Notes (Signed)
Patient was seen for postpartum exam, discharged, procured her Medicaid card for contraception and was checked back in and treated by same clinician. See separate visit note.   Clayton Bibles, CNM 02/10/18 5:31 PM

## 2018-03-30 ENCOUNTER — Other Ambulatory Visit: Payer: Self-pay

## 2018-03-30 ENCOUNTER — Emergency Department (HOSPITAL_COMMUNITY)
Admission: EM | Admit: 2018-03-30 | Discharge: 2018-03-31 | Disposition: A | Discharge status: Home / Self Care | Payer: Self-pay | Attending: Emergency Medicine | Admitting: Emergency Medicine

## 2018-03-30 ENCOUNTER — Encounter (HOSPITAL_COMMUNITY): Payer: Self-pay

## 2018-03-30 DIAGNOSIS — F1210 Cannabis abuse, uncomplicated: Secondary | ICD-10-CM | POA: Insufficient documentation

## 2018-03-30 DIAGNOSIS — R059 Cough, unspecified: Secondary | ICD-10-CM

## 2018-03-30 DIAGNOSIS — R05 Cough: Secondary | ICD-10-CM

## 2018-03-30 DIAGNOSIS — R5383 Other fatigue: Secondary | ICD-10-CM | POA: Insufficient documentation

## 2018-03-30 DIAGNOSIS — Z87891 Personal history of nicotine dependence: Secondary | ICD-10-CM | POA: Insufficient documentation

## 2018-03-30 DIAGNOSIS — R112 Nausea with vomiting, unspecified: Secondary | ICD-10-CM | POA: Insufficient documentation

## 2018-03-30 DIAGNOSIS — M791 Myalgia, unspecified site: Secondary | ICD-10-CM | POA: Insufficient documentation

## 2018-03-30 MED ORDER — ACETAMINOPHEN 325 MG PO TABS
650.0000 mg | ORAL_TABLET | Freq: Once | ORAL | Status: AC | PRN
  Administered 2018-03-30: 650 mg via ORAL
  Filled 2018-03-30: qty 2

## 2018-03-30 NOTE — ED Triage Notes (Signed)
Pt here for a fever and intermittent emesis for the last 3 days.  A&Ox4. Febrile in triage treated with tylenol.

## 2018-03-31 ENCOUNTER — Emergency Department (HOSPITAL_COMMUNITY): Payer: Self-pay

## 2018-03-31 LAB — CBC WITH DIFFERENTIAL/PLATELET
Abs Immature Granulocytes: 0.01 10*3/uL (ref 0.00–0.07)
Basophils Absolute: 0 10*3/uL (ref 0.0–0.1)
Basophils Relative: 0 %
Eosinophils Absolute: 0 10*3/uL (ref 0.0–0.5)
Eosinophils Relative: 0 %
HCT: 42.7 % (ref 36.0–46.0)
Hemoglobin: 13.3 g/dL (ref 12.0–15.0)
Immature Granulocytes: 0 %
Lymphocytes Relative: 41 %
Lymphs Abs: 1.4 10*3/uL (ref 0.7–4.0)
MCH: 27.7 pg (ref 26.0–34.0)
MCHC: 31.1 g/dL (ref 30.0–36.0)
MCV: 89 fL (ref 80.0–100.0)
MONOS PCT: 10 %
Monocytes Absolute: 0.3 10*3/uL (ref 0.1–1.0)
Neutro Abs: 1.7 10*3/uL (ref 1.7–7.7)
Neutrophils Relative %: 49 %
PLATELETS: 191 10*3/uL (ref 150–400)
RBC: 4.8 MIL/uL (ref 3.87–5.11)
RDW: 14.3 % (ref 11.5–15.5)
WBC: 3.5 10*3/uL — ABNORMAL LOW (ref 4.0–10.5)
nRBC: 0 % (ref 0.0–0.2)

## 2018-03-31 LAB — BASIC METABOLIC PANEL
Anion gap: 7 (ref 5–15)
BUN: <5 mg/dL — ABNORMAL LOW (ref 6–20)
CO2: 24 mmol/L (ref 22–32)
Calcium: 8.6 mg/dL — ABNORMAL LOW (ref 8.9–10.3)
Chloride: 103 mmol/L (ref 98–111)
Creatinine, Ser: 0.78 mg/dL (ref 0.44–1.00)
GFR calc Af Amer: >60 mL/min (ref >60)
GFR calc non Af Amer: >60 mL/min (ref >60)
Glucose, Bld: 104 mg/dL — ABNORMAL HIGH (ref 70–99)
POTASSIUM: 3 mmol/L — AB (ref 3.5–5.1)
Sodium: 134 mmol/L — ABNORMAL LOW (ref 135–145)

## 2018-03-31 LAB — I-STAT BETA HCG BLOOD, ED (MC, WL, AP ONLY): I-stat hCG, quantitative: <5 m[IU]/mL (ref <5)

## 2018-03-31 MED ORDER — ONDANSETRON HCL 4 MG/2ML IJ SOLN
4.0000 mg | Freq: Once | INTRAMUSCULAR | Status: AC
  Administered 2018-03-31: 4 mg via INTRAVENOUS
  Filled 2018-03-31: qty 2

## 2018-03-31 MED ORDER — ONDANSETRON 4 MG PO TBDP: 4.0000 mg | ORAL_TABLET | Freq: Three times a day (TID) | ORAL | 0 refills | Status: DC | PRN

## 2018-03-31 MED ORDER — SODIUM CHLORIDE 0.9 % IV BOLUS
1000.0000 mL | Freq: Once | INTRAVENOUS | Status: AC
  Administered 2018-03-31: 1000 mL via INTRAVENOUS

## 2018-03-31 MED ORDER — IBUPROFEN 800 MG PO TABS: 800.0000 mg | ORAL_TABLET | Freq: Three times a day (TID) | ORAL | 0 refills | Status: DC

## 2018-03-31 MED ORDER — POTASSIUM CHLORIDE CRYS ER 20 MEQ PO TBCR
40.0000 meq | EXTENDED_RELEASE_TABLET | Freq: Once | ORAL | Status: AC
  Administered 2018-03-31: 40 meq via ORAL
  Filled 2018-03-31: qty 2

## 2018-03-31 MED ORDER — BENZONATATE 100 MG PO CAPS: 100.0000 mg | ORAL_CAPSULE | Freq: Three times a day (TID) | ORAL | 0 refills | Status: DC

## 2018-03-31 NOTE — Discharge Instructions (Signed)
Take the prescribed medication as directed.  Make sure to rest and drink fluids. Follow-up with your primary care doctor. Return to the ED for new or worsening symptoms. 

## 2018-03-31 NOTE — ED Provider Notes (Signed)
[facility] EMERGENCY DEPARTMENT Provider Note   CSN: [id number] Arrival date & time: 03/30/18  2304    History   Chief Complaint Chief Complaint  Patient presents with  . Fever  . Cough  . Emesis    HPI Julie Cline is a 23 y.o. female.     The history is provided by the patient and medical records.  Fever  Associated symptoms: cough, nausea and vomiting   Cough  Associated symptoms: fever   Emesis  Associated symptoms: cough and fever     23 year old female here with flulike symptoms.  She reports cough, nausea, vomiting, fever, body aches, and fatigue.  She has been sick for about a week total.  Her son was seen here and diagnosed with the flu and she thinks she got it from him.  Reports nausea and vomiting is gotten worse over the past 2 days, not really able to hold down any food or fluids at this time.  She denies any abdominal pain.  Cough has been productive, initially with clear mucus but now appears green.  She denies any chest pain or shortness of breath.  She tried some over-the-counter NyQuil but this made her vomiting much worse.  She was febrile on arrival here, given Tylenol.  Past Medical History:  Diagnosis Date  . Chlamydia   . Gonorrhea 07/05/2016   To go to HD for tx  . Urinary tract infection     Patient Active Problem List   Diagnosis Date Noted  . Nexplanon insertion 02/10/2018  . History of preterm delivery 01/07/2018  . Normal labor 01/05/2018  . BV (bacterial vaginosis) 12/18/2017  . Supervision of normal pregnancy 04/30/2016  . History of placenta abruption 04/30/2016    Past Surgical History:  Procedure Laterality Date  . EYE SURGERY     Eye lid  . HERNIA REPAIR       OB History    Gravida  3   Para  2   Term  1   Preterm  1   AB  1   Living  2     SAB  1   TAB  0   Ectopic  0   Multiple  0   Live Births  2            Home Medications    Prior to Admission medications     Medication Sig Start Date End Date Taking? Authorizing Provider  acetaminophen (TYLENOL) 500 MG tablet Take 500 mg by mouth every 6 (six) hours as needed for mild pain.    [provider]  clindamycin (CLEOCIN) 300 MG capsule Take 1 capsule (300 mg total) by mouth 2 (two) times daily. Patient not taking: Reported on 01/01/2018 12/18/17   Rasch, Harolyn Rutherford, NP  Elastic Bandages & Supports (COMFORT FIT MATERNITY SUPP LG) MISC 1 each by Does not apply route daily as needed. Patient not taking: Reported on 01/01/2018 12/02/17   Rasch, Victorino Dike I, NP  hydrOXYzine (VISTARIL) 25 MG capsule Take 1 capsule (25 mg total) by mouth at bedtime as needed. Can increase to 50 mg if needed. Patient not taking: Reported on 01/05/2018 01/01/18   Rasch, Victorino Dike I, NP  ibuprofen (ADVIL,MOTRIN) 600 MG tablet Take 1 tablet (600 mg total) by mouth every 6 (six) hours. 01/07/18   Gwenevere Abbot, MD  Prenatal MV-Min-FA-Omega-3 (PRENATAL GUMMIES/DHA & FA PO) Take 2 each by mouth daily.     [provider]    [employer]  History Family History  Problem Relation Age of Onset  . Diabetes Maternal Uncle   . Prostate cancer Paternal Grandfather   . Alcohol abuse Neg Hx   . Arthritis Neg Hx   . Asthma Neg Hx   . Birth defects Neg Hx   . Cancer Neg Hx   . COPD Neg Hx   . Depression Neg Hx   . Drug abuse Neg Hx   . Early death Neg Hx   . Hearing loss Neg Hx   . Heart disease Neg Hx   . Hyperlipidemia Neg Hx   . Hypertension Neg Hx   . Kidney disease Neg Hx   . Learning disabilities Neg Hx   . Mental illness Neg Hx   . Mental retardation Neg Hx   . Miscarriages / Stillbirths Neg Hx   . Stroke Neg Hx   . Vision loss Neg Hx   . Varicose Veins Neg Hx     Social History Social History   Tobacco Use  . Smoking status: Former Smoker    Packs/day: 0.25    Years: 6.00    Pack years: 1.50    Types: Cigarettes    Last attempt to quit: 01/27/2013    Years since quitting: 5.1  . Smokeless tobacco:  Never Used  Substance Use Topics  . Alcohol use: No  . Drug use: Yes    Frequency: 7.0 times per week    Types: Marijuana    Comment: used 08/20/2017     Allergies   Patient has no known allergies.   Review of Systems Review of Systems  Constitutional: Positive for fever.  Respiratory: Positive for cough.   Gastrointestinal: Positive for nausea and vomiting.  All other systems reviewed and are negative.    Physical Exam Updated Vital Signs BP 128/82   Pulse 94   Temp 99.7 F (37.6 C) (Oral)   Resp 16   SpO2 94%   Physical Exam Vitals signs and nursing note reviewed.  Constitutional:      Appearance: She is well-developed.  HENT:     Head: Normocephalic and atraumatic.     Right Ear: Tympanic membrane and ear canal normal.     Left Ear: Tympanic membrane and ear canal normal.     Nose: Nose normal.     Mouth/Throat:     Lips: Pink.     Mouth: Mucous membranes are moist.     Pharynx: Oropharynx is clear. Uvula midline.  Eyes:     Conjunctiva/sclera: Conjunctivae normal.     Pupils: Pupils are equal, round, and reactive to light.  Neck:     Musculoskeletal: Normal range of motion.  Cardiovascular:     Rate and Rhythm: Normal rate and regular rhythm.     Heart sounds: Normal heart sounds.  Pulmonary:     Effort: Pulmonary effort is normal.     Breath sounds: Normal breath sounds.  Abdominal:     General: Bowel sounds are normal.     Palpations: Abdomen is soft.     Tenderness: There is no abdominal tenderness. There is no rebound.     Comments: Soft, non-tender  Musculoskeletal: Normal range of motion.  Skin:    General: Skin is warm and dry.  Neurological:     Mental Status: She is alert and oriented to person, place, and time.      ED Treatments / Results  Labs (all labs ordered are listed, but only abnormal results are displayed) Labs Reviewed  CBC  WITH DIFFERENTIAL/PLATELET - Abnormal; Notable for the following components:      Result Value    WBC 3.5 (*)    All other components within normal limits  BASIC METABOLIC PANEL - Abnormal; Notable for the following components:   Sodium 134 (*)    Potassium 3.0 (*)    Glucose, Bld 104 (*)    BUN <5 (*)    Calcium 8.6 (*)    All other components within normal limits  I-STAT BETA HCG BLOOD, ED ([facility], [facility], [facility] ONLY)    EKG None  Radiology Dg Chest 2 View  Result Date: 03/31/2018 CLINICAL DATA:  Flu like symptoms. EXAM: CHEST - 2 VIEW COMPARISON:  03/06/2016 FINDINGS: Normal heart size and mediastinal contours. No acute infiltrate or edema. No effusion or pneumothorax. No acute osseous findings. IMPRESSION: Negative chest. Electronically Signed   By: Marnee Spring M.D.   On: 03/31/2018 04:54    Procedures Procedures (including critical care time)  Medications Ordered in ED Medications  potassium chloride SA (K-DUR,KLOR-CON) CR tablet 40 mEq (has no administration in time range)  acetaminophen (TYLENOL) tablet 650 mg (650 mg Oral Given 03/30/18 2341)  ondansetron (ZOFRAN) injection 4 mg (4 mg Intravenous Given 03/31/18 0526)  sodium chloride 0.9 % bolus 1,000 mL (1,000 mLs Intravenous Bolus from Bag 03/31/18 0525)     Initial Impression / Assessment and Plan / ED Course  I have reviewed the triage vital signs and the nursing notes.  Pertinent labs & imaging results that were available during my care of the patient were reviewed by me and considered in my medical decision making (see chart for details).  23 year old female presenting to the ED with flulike symptoms for the past week.  Reports her son has been sick with flu.  She reports ongoing cough with phlegm but worsening nausea and vomiting.  States she is not been able to eat or drink very much today.  She is afebrile and nontoxic in appearance here.  Her abdomen is soft and benign.  Lungs are overall clear.  Plan for screening labs, chest x-ray.  Will give IV fluids and Zofran.  Patient's labs are overall reassuring.  Mild  hypokalemia, given oral supplementation.  Chest x-ray is clear.  Responded well to IV fluids and Zofran.  She is tolerating oral fluids.  No active emesis here in ED.  Feel she is stable for discharge home.  Well without the window for Tamiflu so we will continue symptomatic management.  Encouraged rest and oral hydration.  Close follow-up with PCP.  Return here for new or acute changes.  Final Clinical Impressions(s) / ED Diagnoses   Final diagnoses:  Non-intractable vomiting with nausea, unspecified vomiting type  Cough    ED Discharge Orders         Ordered    ondansetron (ZOFRAN ODT) 4 MG disintegrating tablet  Every 8 hours PRN     03/31/18 0552    benzonatate (TESSALON) 100 MG capsule  Every 8 hours     03/31/18 0552    ibuprofen (ADVIL,MOTRIN) 800 MG tablet  3 times daily     03/31/18 0552           Garlon Hatchet, PA-C 03/31/18 0557    Ward, Layla Maw, DO 03/31/18 [id number]

## 2018-12-13 ENCOUNTER — Emergency Department (HOSPITAL_COMMUNITY): Payer: Self-pay

## 2018-12-13 ENCOUNTER — Emergency Department (HOSPITAL_COMMUNITY)
Admission: EM | Admit: 2018-12-13 | Discharge: 2018-12-13 | Disposition: A | Discharge status: Home / Self Care | Payer: Self-pay | Attending: Emergency Medicine | Admitting: Emergency Medicine

## 2018-12-13 ENCOUNTER — Encounter (HOSPITAL_COMMUNITY): Payer: Self-pay | Admitting: Emergency Medicine

## 2018-12-13 ENCOUNTER — Other Ambulatory Visit: Payer: Self-pay

## 2018-12-13 DIAGNOSIS — Z87891 Personal history of nicotine dependence: Secondary | ICD-10-CM | POA: Insufficient documentation

## 2018-12-13 DIAGNOSIS — J189 Pneumonia, unspecified organism: Secondary | ICD-10-CM | POA: Insufficient documentation

## 2018-12-13 DIAGNOSIS — R1011 Right upper quadrant pain: Secondary | ICD-10-CM

## 2018-12-13 LAB — URINALYSIS, ROUTINE W REFLEX MICROSCOPIC
Bacteria, UA: NONE SEEN
Bilirubin Urine: NEGATIVE
Glucose, UA: NEGATIVE mg/dL
Hgb urine dipstick: NEGATIVE
Ketones, ur: NEGATIVE mg/dL
Nitrite: NEGATIVE
Protein, ur: 30 mg/dL — AB
Specific Gravity, Urine: 1.026 (ref 1.005–1.030)
pH: 6 (ref 5.0–8.0)

## 2018-12-13 LAB — CBC
HCT: 44.7 % (ref 36.0–46.0)
Hemoglobin: 14.3 g/dL (ref 12.0–15.0)
MCH: 30.6 pg (ref 26.0–34.0)
MCHC: 32 g/dL (ref 30.0–36.0)
MCV: 95.7 fL (ref 80.0–100.0)
Platelets: 292 10*3/uL (ref 150–400)
RBC: 4.67 MIL/uL (ref 3.87–5.11)
RDW: 13.2 % (ref 11.5–15.5)
WBC: 8.3 10*3/uL (ref 4.0–10.5)
nRBC: 0 % (ref 0.0–0.2)

## 2018-12-13 LAB — COMPREHENSIVE METABOLIC PANEL
ALT: 11 U/L (ref 0–44)
AST: 14 U/L — ABNORMAL LOW (ref 15–41)
Albumin: 3.9 g/dL (ref 3.5–5.0)
Alkaline Phosphatase: 57 U/L (ref 38–126)
Anion gap: 10 (ref 5–15)
BUN: 6 mg/dL (ref 6–20)
CO2: 24 mmol/L (ref 22–32)
Calcium: 9.1 mg/dL (ref 8.9–10.3)
Chloride: 103 mmol/L (ref 98–111)
Creatinine, Ser: 0.69 mg/dL (ref 0.44–1.00)
GFR calc Af Amer: >60 mL/min (ref >60)
GFR calc non Af Amer: >60 mL/min (ref >60)
Glucose, Bld: 99 mg/dL (ref 70–99)
Potassium: 3.6 mmol/L (ref 3.5–5.1)
Sodium: 137 mmol/L (ref 135–145)
Total Bilirubin: 0.7 mg/dL (ref 0.3–1.2)
Total Protein: 7.2 g/dL (ref 6.5–8.1)

## 2018-12-13 LAB — I-STAT BETA HCG BLOOD, ED (MC, WL, AP ONLY): I-stat hCG, quantitative: <5 m[IU]/mL (ref <5)

## 2018-12-13 LAB — LIPASE, BLOOD: Lipase: 13 U/L (ref 11–51)

## 2018-12-13 MED ORDER — SODIUM CHLORIDE 0.9% FLUSH: 3.0000 mL | Freq: Once | INTRAVENOUS | Status: DC

## 2018-12-13 MED ORDER — MORPHINE SULFATE (PF) 4 MG/ML IV SOLN
4.0000 mg | Freq: Once | INTRAVENOUS | Status: DC
  Filled 2018-12-13: qty 1

## 2018-12-13 MED ORDER — KETOROLAC TROMETHAMINE 15 MG/ML IJ SOLN
15.0000 mg | Freq: Once | INTRAMUSCULAR | Status: AC
  Administered 2018-12-13: 18:00:00 15 mg via INTRAVENOUS
  Filled 2018-12-13: qty 1

## 2018-12-13 MED ORDER — MELOXICAM 7.5 MG PO TABS: 7.5000 mg | ORAL_TABLET | Freq: Two times a day (BID) | ORAL | 0 refills | Status: AC

## 2018-12-13 MED ORDER — AZITHROMYCIN 250 MG PO TABS: 250.0000 mg | ORAL_TABLET | Freq: Every day | ORAL | 0 refills | Status: AC

## 2018-12-13 MED ORDER — SODIUM CHLORIDE 0.9 % IV SOLN
1.0000 g | Freq: Once | INTRAVENOUS | Status: AC
  Administered 2018-12-13: 18:00:00 1 g via INTRAVENOUS
  Filled 2018-12-13: qty 10

## 2018-12-13 MED ORDER — MORPHINE SULFATE (PF) 4 MG/ML IV SOLN
4.0000 mg | Freq: Once | INTRAVENOUS | Status: AC
  Administered 2018-12-13: 16:00:00 4 mg via INTRAVENOUS
  Filled 2018-12-13: qty 1

## 2018-12-13 NOTE — ED Notes (Signed)
Patient verbalizes understanding of discharge instructions . Opportunity for questions and answers were provided . Armband removed by staff ,Pt discharged from ED. W/C  offered at D/C  and Declined W/C at D/C and was escorted to lobby by RN.  

## 2018-12-13 NOTE — ED Triage Notes (Signed)
C/o sharp RUQ pain x 2 days that radiates to back.  Denies nausea/vomiting.

## 2018-12-13 NOTE — Discharge Instructions (Signed)
You were seen today for pain in your right side. It appears you have a pneumonia. You were given pain medication and antibiotics through your IV. I have sent some more pain medication and antibiotics to the pharmacy. You can start these tomorrow. Follow up with your regular doctor in 1 to 2 weeks so that they can check up on you again.  If you have any new or worsening symptoms please return to the emergency department.

## 2018-12-13 NOTE — ED Provider Notes (Signed)
[facility] EMERGENCY DEPARTMENT Provider Note   CSN: [id number] Arrival date & time: 12/13/18  1306     History   Chief Complaint Chief Complaint  Patient presents with  . Abdominal Pain    HPI Julie Cline is a 23 y.o. female.     Patient is a 23 year old female presenting to the emergency department for right upper quadrant pain which has been worsening over the past 2 days.  She denies any nausea, vomiting, chest pain, shortness of breath, fever, chills, vaginal discharge, vaginal bleeding, dysuria.  She reports that the pain is worse with taking deep breaths.  She reports that she is eating and drinking normally.  No history of the same in the past.     Past Medical History:  Diagnosis Date  . Chlamydia   . Gonorrhea 07/05/2016   To go to HD for tx  . Urinary tract infection     Patient Active Problem List   Diagnosis Date Noted  . Nexplanon insertion 02/10/2018  . History of preterm delivery 01/07/2018  . Normal labor 01/05/2018  . BV (bacterial vaginosis) 12/18/2017  . Supervision of normal pregnancy 04/30/2016  . History of placenta abruption 04/30/2016    Past Surgical History:  Procedure Laterality Date  . EYE SURGERY     Eye lid  . HERNIA REPAIR       OB History    Gravida  3   Para  2   Term  1   Preterm  1   AB  1   Living  2     SAB  1   TAB  0   Ectopic  0   Multiple  0   Live Births  2            Home Medications    Prior to Admission medications   Medication Sig Start Date End Date Taking? Authorizing Provider  acetaminophen (TYLENOL) 500 MG tablet Take 500 mg by mouth every 6 (six) hours as needed for mild pain.    [provider]  azithromycin (ZITHROMAX Z-PAK) 250 MG tablet Take 1 tablet (250 mg total) by mouth daily for 5 days. 5 dya z pack. Take 2 tablets on day one and then 1 tablet daily until gone 12/13/18 12/18/18  Ronnie Doss A, PA-C  benzonatate (TESSALON) 100 MG capsule  Take 1 capsule (100 mg total) by mouth every 8 (eight) hours. 03/31/18   Garlon Hatchet, PA-C  clindamycin (CLEOCIN) 300 MG capsule Take 1 capsule (300 mg total) by mouth 2 (two) times daily. Patient not taking: Reported on 01/01/2018 12/18/17   Rasch, Harolyn Rutherford, NP  Elastic Bandages & Supports (COMFORT FIT MATERNITY SUPP LG) MISC 1 each by Does not apply route daily as needed. Patient not taking: Reported on 01/01/2018 12/02/17   Rasch, Victorino Dike I, NP  hydrOXYzine (VISTARIL) 25 MG capsule Take 1 capsule (25 mg total) by mouth at bedtime as needed. Can increase to 50 mg if needed. Patient not taking: Reported on 01/05/2018 01/01/18   Rasch, Victorino Dike I, NP  ibuprofen (ADVIL,MOTRIN) 800 MG tablet Take 1 tablet (800 mg total) by mouth 3 (three) times daily. 03/31/18   Garlon Hatchet, PA-C  meloxicam (MOBIC) 7.5 MG tablet Take 1 tablet (7.5 mg total) by mouth 2 (two) times daily for 5 days. 12/13/18 12/18/18  Ronnie Doss A, PA-C  ondansetron (ZOFRAN ODT) 4 MG disintegrating tablet Take 1 tablet (4 mg total) by mouth every 8 (eight)  hours as needed for nausea. 03/31/18   Garlon HatchetSanders, Lisa M, PA-C  Prenatal MV-Min-FA-Omega-3 (PRENATAL GUMMIES/DHA & FA PO) Take 2 each by mouth daily.     [provider]    Family History Family History  Problem Relation Age of Onset  . Diabetes Maternal Uncle   . Prostate cancer Paternal Grandfather   . Alcohol abuse Neg Hx   . Arthritis Neg Hx   . Asthma Neg Hx   . Birth defects Neg Hx   . Cancer Neg Hx   . COPD Neg Hx   . Depression Neg Hx   . Drug abuse Neg Hx   . Early death Neg Hx   . Hearing loss Neg Hx   . Heart disease Neg Hx   . Hyperlipidemia Neg Hx   . Hypertension Neg Hx   . Kidney disease Neg Hx   . Learning disabilities Neg Hx   . Mental illness Neg Hx   . Mental retardation Neg Hx   . Miscarriages / Stillbirths Neg Hx   . Stroke Neg Hx   . Vision loss Neg Hx   . Varicose Veins Neg Hx     Social History Social History   Tobacco Use   . Smoking status: Former Smoker    Packs/day: 0.25    Years: 6.00    Pack years: 1.50    Types: Cigarettes    Quit date: 01/27/2013    Years since quitting: 5.8  . Smokeless tobacco: Never Used  Substance Use Topics  . Alcohol use: No  . Drug use: Yes    Frequency: 7.0 times per week    Types: Marijuana    Comment: used 08/20/2017     Allergies   Patient has no known allergies.   Review of Systems Review of Systems  Constitutional: Negative for appetite change, chills and fever.  HENT: Negative for congestion and rhinorrhea.   Respiratory: Negative for cough and shortness of breath.   Cardiovascular: Negative for chest pain.  Gastrointestinal: Positive for abdominal pain. Negative for diarrhea, nausea and vomiting.  Genitourinary: Negative for decreased urine volume, dysuria, pelvic pain, urgency, vaginal bleeding, vaginal discharge and vaginal pain.  Skin: Negative for rash and wound.  Neurological: Negative for dizziness and light-headedness.     Physical Exam Updated Vital Signs BP (!) 135/91   Pulse 83   Temp 98.9 F (37.2 C) (Oral)   Resp 14   Ht 5\' 4"  (1.626 m)   SpO2 99%   BMI 27.29 kg/m   Physical Exam Vitals signs and nursing note reviewed.  Constitutional:      Appearance: Normal appearance.  HENT:     Head: Normocephalic.     Mouth/Throat:     Mouth: Mucous membranes are moist.  Eyes:     Conjunctiva/sclera: Conjunctivae normal.  Cardiovascular:     Rate and Rhythm: Normal rate and regular rhythm.  Pulmonary:     Effort: Pulmonary effort is normal.     Breath sounds: Normal breath sounds.  Abdominal:     General: Abdomen is flat. Bowel sounds are normal.     Palpations: Abdomen is soft.     Tenderness: There is abdominal tenderness in the right upper quadrant. There is right CVA tenderness and guarding. There is no left CVA tenderness. Positive signs include Murphy's sign.    Skin:    General: Skin is dry.  Neurological:     Mental  Status: She is alert.  Psychiatric:  Mood and Affect: Mood normal.      ED Treatments / Results  Labs (all labs ordered are listed, but only abnormal results are displayed) Labs Reviewed  COMPREHENSIVE METABOLIC PANEL - Abnormal; Notable for the following components:      Result Value   AST 14 (*)    All other components within normal limits  URINALYSIS, ROUTINE W REFLEX MICROSCOPIC - Abnormal; Notable for the following components:   APPearance CLOUDY (*)    Protein, ur 30 (*)    Leukocytes,Ua MODERATE (*)    All other components within normal limits  LIPASE, BLOOD  CBC  I-STAT BETA HCG BLOOD, ED ([facility], [facility], [facility] ONLY)    EKG None  Radiology Dg Chest Portable 1 View  Result Date: 12/13/2018 CLINICAL DATA:  Right-sided chest pain EXAM: PORTABLE CHEST 1 VIEW COMPARISON:  03/06/2016 FINDINGS: The heart size and mediastinal contours are within normal limits. Both lungs are clear. The visualized skeletal structures are unremarkable. IMPRESSION: No active disease. Electronically Signed   By: Donavan Foil M.D.   On: 12/13/2018 16:24   Ct Renal Stone Study  Result Date: 12/13/2018 CLINICAL DATA:  Right upper quadrant abdominal pain and flank pain for 2 days. EXAM: CT ABDOMEN AND PELVIS WITHOUT CONTRAST TECHNIQUE: Multidetector CT imaging of the abdomen and pelvis was performed following the standard protocol without IV contrast. COMPARISON:  Chest radiograph 12/13/2018 FINDINGS: Lower chest: Small right pleural effusion with localized retro diaphragmatic airspace opacity in the posterior basal segment right lower lobe. Hepatobiliary: Unremarkable Pancreas: Unremarkable Spleen: Unremarkable Adrenals/Urinary Tract: Unremarkable Stomach/Bowel: Unremarkable Vascular/Lymphatic: Unremarkable Reproductive: Unremarkable Other: No supplemental non-categorized findings. Musculoskeletal: Unremarkable IMPRESSION: 1. Airspace opacity in the posterior basal segment right lower lobe, suspicious for  pneumonia. Adjacent small right pleural effusion. Electronically Signed   By: Van Clines M.D.   On: 12/13/2018 18:07   US Abdomen Limited Ruq  Result Date: 12/13/2018 CLINICAL DATA:  Right upper quadrant abdominal pain for the past 3 days. EXAM: ULTRASOUND ABDOMEN LIMITED RIGHT UPPER QUADRANT COMPARISON:  None. FINDINGS: Gallbladder: No gallstones or wall thickening visualized. No sonographic Murphy sign noted by sonographer. Common bile duct: Diameter: 2.9 mm Liver: No focal lesion identified. Within normal limits in parenchymal echogenicity. Portal vein is patent on color Doppler imaging with normal direction of blood flow towards the liver. Other: None. IMPRESSION: Normal examination. Electronically Signed   By: Claudie Revering M.D.   On: 12/13/2018 15:35    Procedures Procedures (including critical care time)  Medications Ordered in ED Medications  sodium chloride flush (NS) 0.9 % injection 3 mL (has no administration in time range)  morphine 4 MG/ML injection 4 mg (4 mg Intravenous Given 12/13/18 1555)  ketorolac (TORADOL) 15 MG/ML injection 15 mg (15 mg Intravenous Given 12/13/18 1820)  cefTRIAXone (ROCEPHIN) 1 g in sodium chloride 0.9 % 100 mL IVPB (0 g Intravenous Stopped 12/13/18 1852)     Initial Impression / Assessment and Plan / ED Course  I have reviewed the triage vital signs and the nursing notes.  Pertinent labs & imaging results that were available during my care of the patient were reviewed by me and considered in my medical decision making (see chart for details).  Clinical Course as of Dec 12 2100  Nancy Fetter Dec 13, 2018  1820 Patient presenting with a right upper quadrant pain since yesterday.  No nausea, vomiting, fever.  No coughing or URI symptoms.  Tender to palpation in the right upper quadrant.  Lungs are clear.  Labs were reassuring and vital signs normal.  Patient had a right upper quadrant ultrasound and a chest x-ray both of which were normal.  And then  proceeded with a CT scan which revealed a right lower lobe pneumonia which is the probable cause of her symptoms.  She is afebrile and does not meet admission criteria.  She will be given IV Rocephin and p.o. azithromycin and treated for community-acquired pneumonia.  I discussed a Covid test with this patient.  She is adamant that she has not been exposed and neither has anyone in her household.  She reports that people in her household have gotten a routine Covid test recently and it was negative.   [KM]    Clinical Course User Index [KM] Arlyn Dunning, PA-C       Based on review of vitals, medical screening exam, lab work and/or imaging, there does not appear to be an acute, emergent etiology for the patient's symptoms. Counseled pt on good return precautions and encouraged both PCP and ED follow-up as needed.  Prior to discharge, I also discussed incidental imaging findings with patient in detail and advised appropriate, recommended follow-up in detail.  Clinical Impression: 1. Community acquired pneumonia of right lower lobe of lung   2. RUQ pain     Disposition: Discharge  Prior to providing a prescription for a controlled substance, I independently reviewed the patient's recent prescription history on the [place] Controlled Substance Reporting System. The patient had no recent or regular prescriptions and was deemed appropriate for a brief, less than 3 day prescription of narcotic for acute analgesia.  This note was prepared with assistance of Conservation officer, historic buildings. Occasional wrong-word or sound-a-like substitutions may have occurred due to the inherent limitations of voice recognition software.   Final Clinical Impressions(s) / ED Diagnoses   Final diagnoses:  RUQ pain  Community acquired pneumonia of right lower lobe of lung    ED Discharge Orders         Ordered    azithromycin (ZITHROMAX Z-PAK) 250 MG tablet  Daily     12/13/18 1829    meloxicam  (MOBIC) 7.5 MG tablet  2 times daily     12/13/18 1829           Jeral Pinch 12/13/18 2103    Rolan Bucco, MD 12/13/18 2118

## 2019-01-11 ENCOUNTER — Ambulatory Visit (HOSPITAL_COMMUNITY)
Admission: EM | Admit: 2019-01-11 | Discharge: 2019-01-11 | Disposition: A | Discharge status: Home / Self Care | Payer: Self-pay | Attending: Emergency Medicine | Admitting: Emergency Medicine

## 2019-01-11 ENCOUNTER — Other Ambulatory Visit: Payer: Self-pay

## 2019-01-11 ENCOUNTER — Encounter (HOSPITAL_COMMUNITY): Payer: Self-pay

## 2019-01-11 DIAGNOSIS — Z3202 Encounter for pregnancy test, result negative: Secondary | ICD-10-CM

## 2019-01-11 DIAGNOSIS — N898 Other specified noninflammatory disorders of vagina: Secondary | ICD-10-CM

## 2019-01-11 DIAGNOSIS — Z202 Contact with and (suspected) exposure to infections with a predominantly sexual mode of transmission: Secondary | ICD-10-CM

## 2019-01-11 DIAGNOSIS — N912 Amenorrhea, unspecified: Secondary | ICD-10-CM

## 2019-01-11 DIAGNOSIS — Z8619 Personal history of other infectious and parasitic diseases: Secondary | ICD-10-CM

## 2019-01-11 DIAGNOSIS — Z113 Encounter for screening for infections with a predominantly sexual mode of transmission: Secondary | ICD-10-CM

## 2019-01-11 LAB — POCT URINALYSIS DIP (DEVICE)
Bilirubin Urine: NEGATIVE
Glucose, UA: NEGATIVE mg/dL
Ketones, ur: NEGATIVE mg/dL
Nitrite: NEGATIVE
Protein, ur: NEGATIVE mg/dL
Specific Gravity, Urine: 1.025 (ref 1.005–1.030)
Urobilinogen, UA: 0.2 mg/dL (ref 0.0–1.0)
pH: 6 (ref 5.0–8.0)

## 2019-01-11 LAB — POC URINE PREG, ED
Preg Test, Ur: NEGATIVE
Preg Test, Ur: NEGATIVE

## 2019-01-11 LAB — POCT PREGNANCY, URINE: Preg Test, Ur: NEGATIVE

## 2019-01-11 MED ORDER — AZITHROMYCIN 250 MG PO TABS
1000.0000 mg | ORAL_TABLET | Freq: Once | ORAL | Status: AC
  Administered 2019-01-11: 20:00:00 1000 mg via ORAL

## 2019-01-11 MED ORDER — CEFTRIAXONE SODIUM 250 MG IJ SOLR
INTRAMUSCULAR | Status: AC
  Filled 2019-01-11: qty 250

## 2019-01-11 MED ORDER — CEFTRIAXONE SODIUM 250 MG IJ SOLR
250.0000 mg | Freq: Once | INTRAMUSCULAR | Status: AC
  Administered 2019-01-11: 20:00:00 250 mg via INTRAMUSCULAR

## 2019-01-11 MED ORDER — AZITHROMYCIN 250 MG PO TABS
ORAL_TABLET | ORAL | Status: AC
  Filled 2019-01-11: qty 4

## 2019-01-11 MED ORDER — LIDOCAINE HCL (PF) 1 % IJ SOLN
INTRAMUSCULAR | Status: AC
  Filled 2019-01-11: qty 2

## 2019-01-11 MED ORDER — METRONIDAZOLE 500 MG PO TABS: 500.0000 mg | ORAL_TABLET | Freq: Two times a day (BID) | ORAL | 0 refills | Status: AC

## 2019-01-11 MED ORDER — FLUCONAZOLE 150 MG PO TABS: 150.0000 mg | ORAL_TABLET | Freq: Once | ORAL | 1 refills | Status: AC

## 2019-01-11 NOTE — ED Triage Notes (Signed)
Pt present vaginal discharge that is yellowish, green. Symptoms started yesterday with some itching and burning.

## 2019-01-11 NOTE — ED Provider Notes (Signed)
HPI  SUBJECTIVE:  Julie Cline is a 23 y.o. female who presents with 2 days of thick white nonodorous vaginal discharge that has become thick and yellowish-green for the past 2 days.  She reports vaginal itching.  No genital rash, vomiting, fevers.  No back, abdominal, pelvic pain.  No urinary complaints.  No recent antibiotics, perfumed soaps.  She has a new female partner, she is not sure if he is having any symptoms.  They use condoms inconsistently.  She denies having any other sexual partners but thinks that he has others.  She tried showering with improvement in her symptoms.  No aggravating factors.  She has a past medical history of gonorrhea, chlamydia, BV.  No history of HIV, HSV, syphilis, trichomonas, yeast, diabetes, PID.  LMP: August 2020  She has a Insurance risk surveyorexplanon.  She was regular prior to August.  PMD: None    Past Medical History:  Diagnosis Date  . Chlamydia   . Gonorrhea 07/05/2016   To go to HD for tx  . Urinary tract infection     Past Surgical History:  Procedure Laterality Date  . EYE SURGERY     Eye lid  . HERNIA REPAIR      Family History  Problem Relation Age of Onset  . Diabetes Maternal Uncle   . Prostate cancer Paternal Grandfather   . Alcohol abuse Neg Hx   . Arthritis Neg Hx   . Asthma Neg Hx   . Birth defects Neg Hx   . Cancer Neg Hx   . COPD Neg Hx   . Depression Neg Hx   . Drug abuse Neg Hx   . Early death Neg Hx   . Hearing loss Neg Hx   . Heart disease Neg Hx   . Hyperlipidemia Neg Hx   . Hypertension Neg Hx   . Kidney disease Neg Hx   . Learning disabilities Neg Hx   . Mental illness Neg Hx   . Mental retardation Neg Hx   . Miscarriages / Stillbirths Neg Hx   . Stroke Neg Hx   . Vision loss Neg Hx   . Varicose Veins Neg Hx     Social History   Tobacco Use  . Smoking status: Former Smoker    Packs/day: 0.25    Years: 6.00    Pack years: 1.50    Types: Cigarettes    Quit date: 01/27/2013    Years since quitting: 5.9  .  Smokeless tobacco: Never Used  Substance Use Topics  . Alcohol use: No  . Drug use: Yes    Frequency: 7.0 times per week    Types: Marijuana    Comment: used 08/20/2017    No current facility-administered medications for this encounter.  Current Outpatient Medications:  .  acetaminophen (TYLENOL) 500 MG tablet, Take 500 mg by mouth every 6 (six) hours as needed for mild pain., Disp: , Rfl:  .  fluconazole (DIFLUCAN) 150 MG tablet, Take 1 tablet (150 mg total) by mouth once for 1 dose. 1 tab po x 1. May repeat in 72 hours if no improvement, Disp: 2 tablet, Rfl: 1 .  metroNIDAZOLE (FLAGYL) 500 MG tablet, Take 1 tablet (500 mg total) by mouth 2 (two) times daily for 7 days., Disp: 14 tablet, Rfl: 0 .  ondansetron (ZOFRAN ODT) 4 MG disintegrating tablet, Take 1 tablet (4 mg total) by mouth every 8 (eight) hours as needed for nausea., Disp: 10 tablet, Rfl: 0 .  Prenatal MV-Min-FA-Omega-3 (PRENATAL  GUMMIES/DHA & FA PO), Take 2 each by mouth daily. , Disp: , Rfl:   No Known Allergies   ROS  As noted in HPI.   Physical Exam  BP 122/68   Pulse 85   Temp 98.6 F (37 C) (Oral)   Resp 16   SpO2 100%   Constitutional: Well developed, well nourished, no acute distress Eyes:  EOMI, conjunctiva normal bilaterally HENT: Normocephalic, atraumatic,mucus membranes moist Respiratory: Normal inspiratory effort Cardiovascular: Normal rate GI: nondistended soft, nontender. No suprapubic tenderness  back: No CVA tenderness GU: Deferred  skin: No rash, skin intact Musculoskeletal: no deformities Neurologic: Alert & oriented x 3, no focal neuro deficits Psychiatric: Speech and behavior appropriate   ED Course   Medications  cefTRIAXone (ROCEPHIN) injection 250 mg (250 mg Intramuscular Given 01/11/19 1937)  azithromycin (ZITHROMAX) tablet 1,000 mg (1,000 mg Oral Given 01/11/19 1936)    Orders Placed This Encounter  Procedures  . POCT urinalysis dip (device)    Standing Status:    Standing    Number of Occurrences:   1  . POC urine pregnancy    Standing Status:   Standing    Number of Occurrences:   1  . Pregnancy, urine POC    Standing Status:   Standing    Number of Occurrences:   1  . POC urine preg, ED (not at [facility])    Standing Status:   Standing    Number of Occurrences:   1    Results for orders placed or performed during the hospital encounter of 01/11/19 (from the past 24 hour(s))  POCT urinalysis dip (device)     Status: Abnormal   Collection Time: 01/11/19  6:57 PM  Result Value Ref Range   Glucose, UA NEGATIVE NEGATIVE mg/dL   Bilirubin Urine NEGATIVE NEGATIVE   Ketones, ur NEGATIVE NEGATIVE mg/dL   Specific Gravity, Urine 1.025 1.005 - 1.030   Hgb urine dipstick TRACE (A) NEGATIVE   pH 6.0 5.0 - 8.0   Protein, ur NEGATIVE NEGATIVE mg/dL   Urobilinogen, UA 0.2 0.0 - 1.0 mg/dL   Nitrite NEGATIVE NEGATIVE   Leukocytes,Ua LARGE (A) NEGATIVE  POC urine pregnancy     Status: None   Collection Time: 01/11/19  7:24 PM  Result Value Ref Range   Preg Test, Ur NEGATIVE NEGATIVE  Pregnancy, urine POC     Status: None   Collection Time: 01/11/19  7:24 PM  Result Value Ref Range   Preg Test, Ur NEGATIVE NEGATIVE  POC urine preg, ED (not at [facility])     Status: None   Collection Time: 01/11/19  7:24 PM  Result Value Ref Range   Preg Test, Ur NEGATIVE NEGATIVE   No results found.  ED Clinical Impression  1. Vaginal discharge   2. Screening for STD (sexually transmitted disease)   3. Amenorrhea     ED Assessment/Plan   UA noted.  She has no urinary complaints.  Suspect the leukocytes are from the vaginal discharge.  Patient is not pregnant.  H&P most c/w yeast infection versus BV.  Sent off GC/chlamydia, wet prep patient declined HIV, RPR. Will  treat empirically now. Giving ceftriaxone 250 mg IM, azithro 1 gm po Will send home with flagyl, diflucan for yeast infection. Advised pt to refrain from sexual contact until she  knows lab results,  symptoms resolve, and partner(s) are treated if necessary. Pt provided working phone number. Follow-up with PMD of choice as needed.  Will provide primary care list.  discussed labs, MDM, plan and followup with patient. Pt agrees with plan.   Meds ordered this encounter  Medications  . cefTRIAXone (ROCEPHIN) injection 250 mg    Order Specific Question:   Antibiotic Indication:    Answer:   STD  . azithromycin (ZITHROMAX) tablet 1,000 mg  . metroNIDAZOLE (FLAGYL) 500 MG tablet    Sig: Take 1 tablet (500 mg total) by mouth 2 (two) times daily for 7 days.    Dispense:  14 tablet    Refill:  0  . fluconazole (DIFLUCAN) 150 MG tablet    Sig: Take 1 tablet (150 mg total) by mouth once for 1 dose. 1 tab po x 1. May repeat in 72 hours if no improvement    Dispense:  2 tablet    Refill:  1    *This clinic note was created using Scientist, clinical (histocompatibility and immunogenetics). Therefore, there may be occasional mistakes despite careful proofreading.  ?    Domenick Gong, MD 01/11/19 1941

## 2019-01-11 NOTE — Discharge Instructions (Addendum)
I suspect that you have BV, which I am treating with Flagyl.  Diflucan for yeast.  We have treated you empirically for gonorrhea and chlamydia today.  Take the medication as written. Give [place] a working phone number so that we can contact you if needed. Refrain from sexual contact until you know your results and your partner(s) are treated if necessary. Return to the ER if you get worse, have a fever >100.4, or for any concerns.   Below is a list of primary care practices who are taking new patients for you to follow-up with.  [facility] at [facility] [address] [place], [place] [address] [phone]  [place], [place] [address] [phone]  [facility] [phone] [place] [address]  [facility]: [place]  [phone]  [facility]: [place]   [phone]  [facility]: [address] [place]  [phone]  [facility] : [address] [place] [phone]  [facility]: [address] [place] [address] [phone]  [facility]: [place] [phone]  Dr. Blanchie Serve [place]  [phone]  Dr. Benito Mccreedy, [facility]. [place],  [address]  [phone]  Go to www.goodrx.com to look up your medications. This will give you a list of where you can find your prescriptions at the most affordable prices. Or ask the pharmacist what the cash price is, or if they have any other discount programs  available to help make your medication more affordable. This can be less expensive than what you would pay with insurance.

## 2019-01-13 LAB — CERVICOVAGINAL ANCILLARY ONLY
Bacterial vaginitis: POSITIVE — AB
Candida vaginitis: POSITIVE — AB
Chlamydia: NEGATIVE
Neisseria Gonorrhea: NEGATIVE
Trichomonas: NEGATIVE

## 2019-10-11 IMAGING — US US OB < 14 WEEKS - US OB TV
1 series · 15 of 28 positions shown · non-contrast
Comparison: None.

CLINICAL DATA: Back pain.

EXAM:
OBSTETRIC <14 WK US AND TRANSVAGINAL OB US
TECHNIQUE: Both transabdominal and transvaginal ultrasound examinations were
performed for complete evaluation of the gestation as well as the
maternal uterus, adnexal regions, and pelvic cul-de-sac.
Transvaginal technique was performed to assess early pregnancy.

[Series 1: us ob < 14 weeks - us ob tv · 15 of 33 slices shown]
[im 1/33]
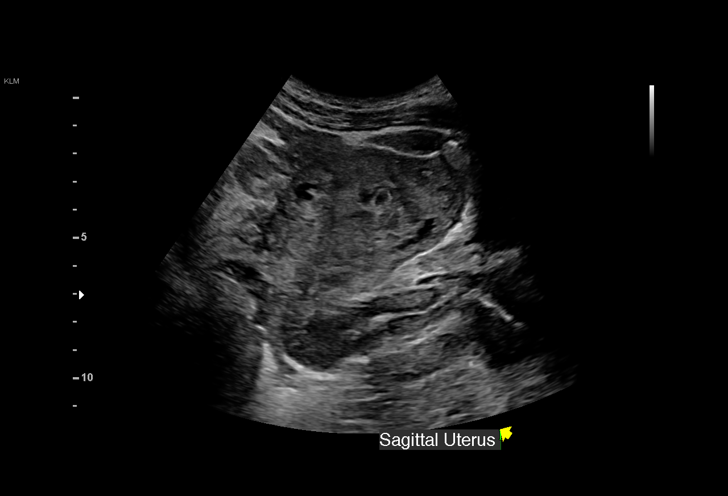
[im 3/33]
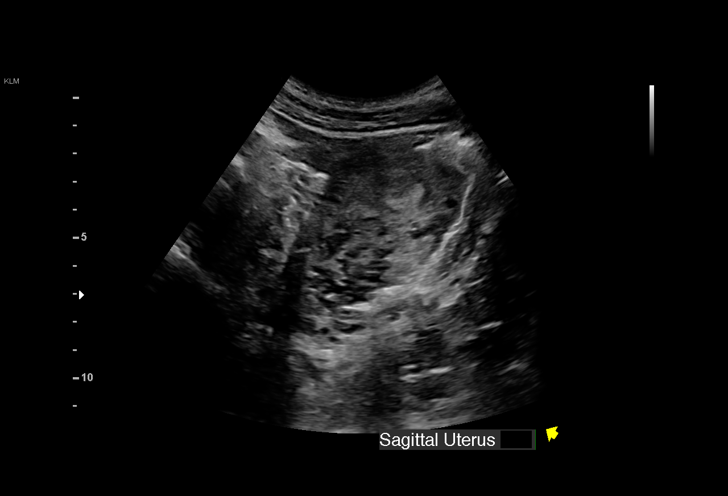
[im 5/33]
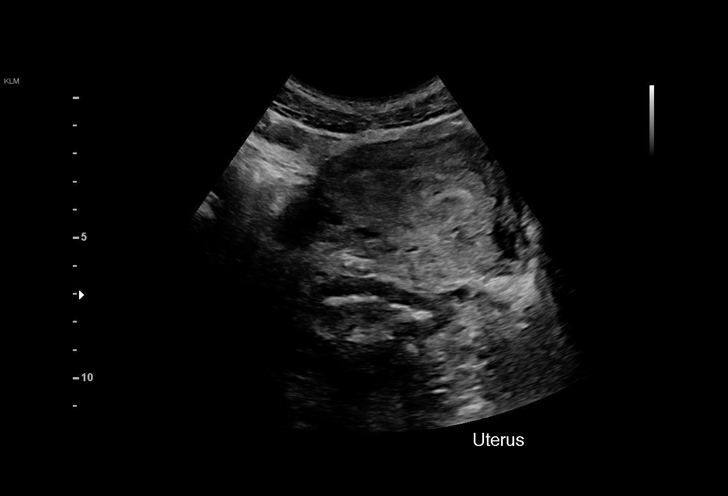
[im 8/33]
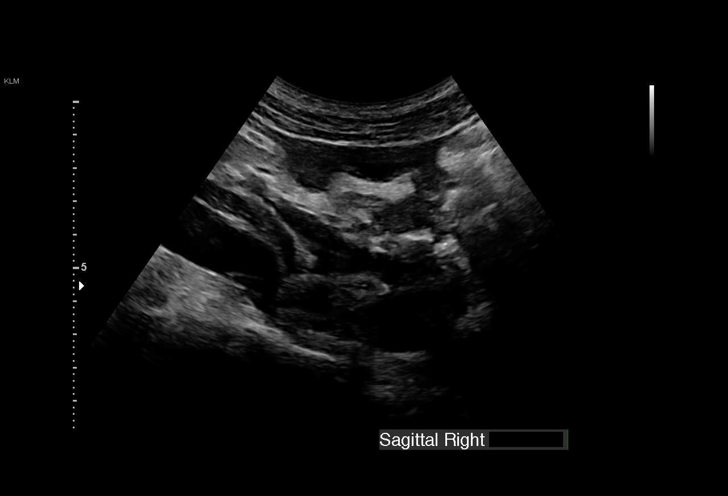
[im 10/33]
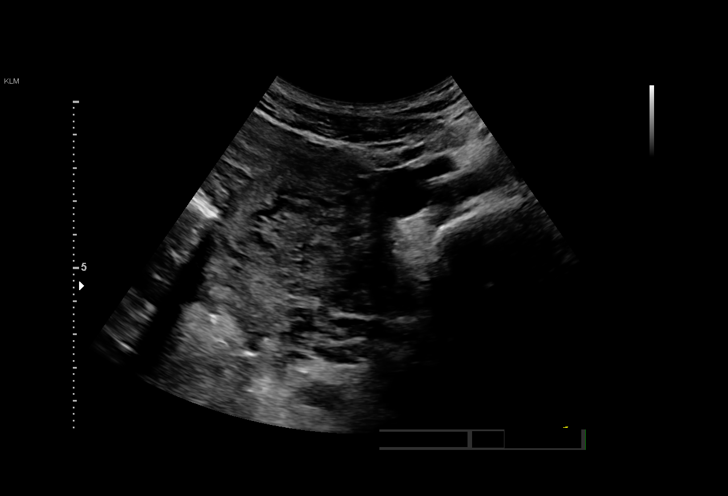
[im 12/33]
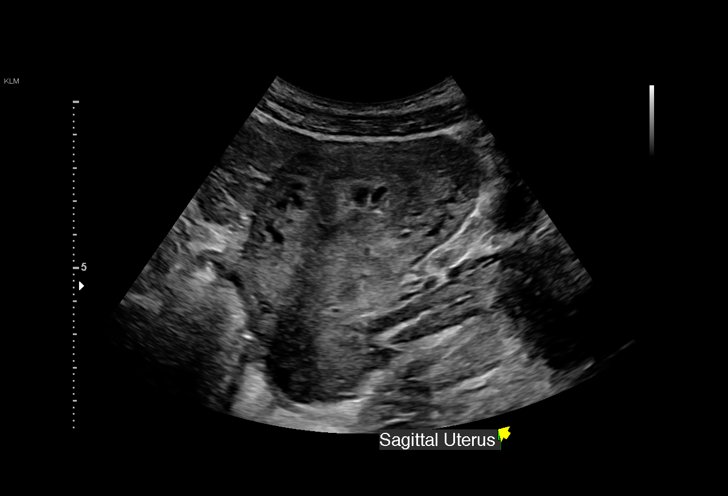
[im 15/33]
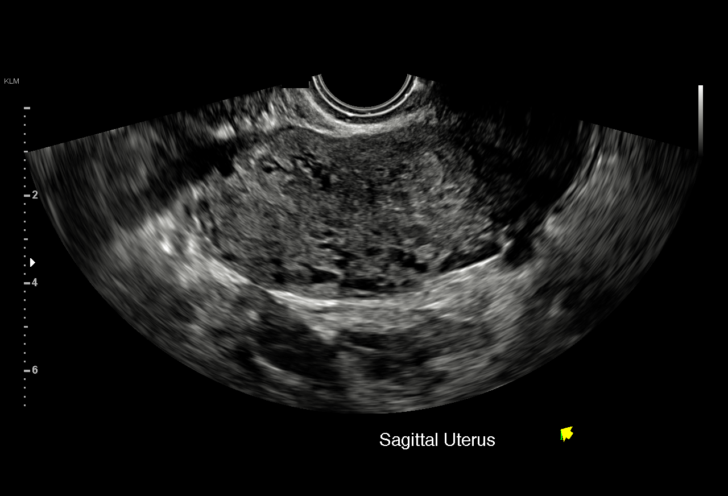
[im 17/33]
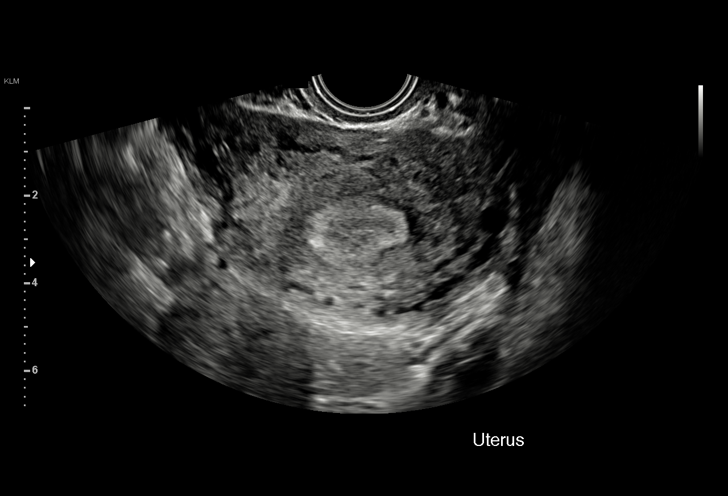
[im 18/33]
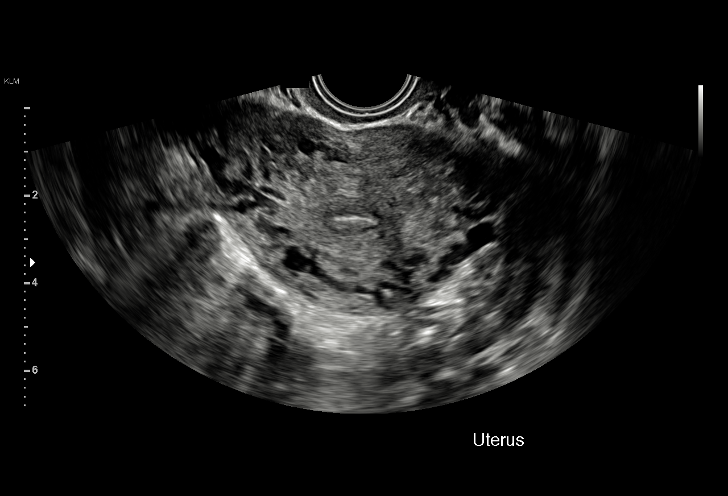
[im 21/33]
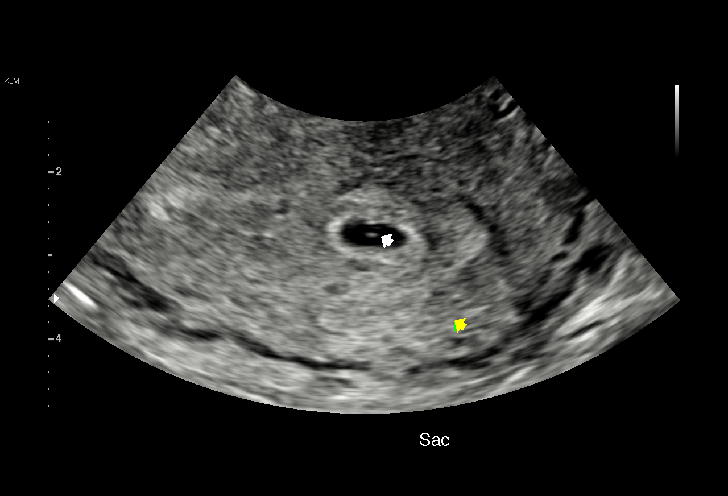
[im 23/33]
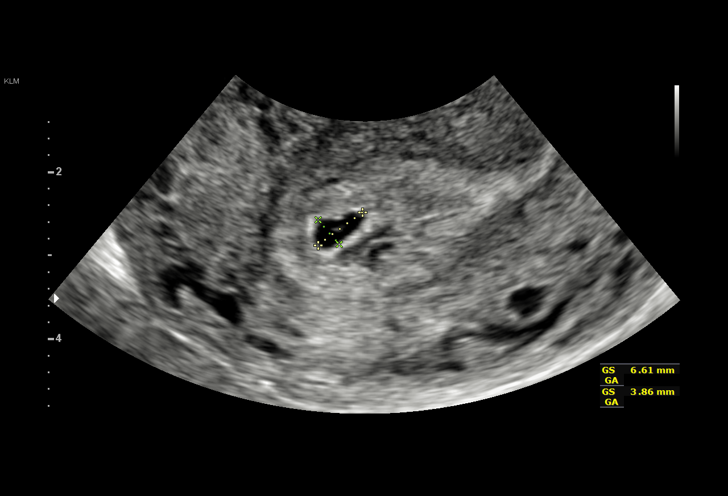
[im 25/33]
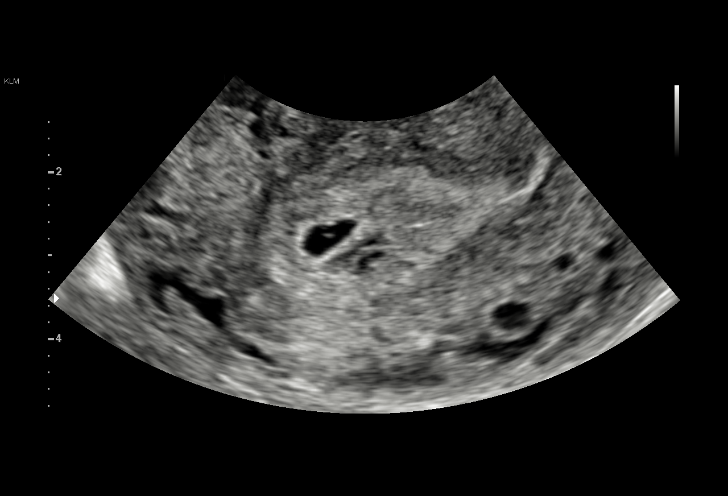
[im 28/33]
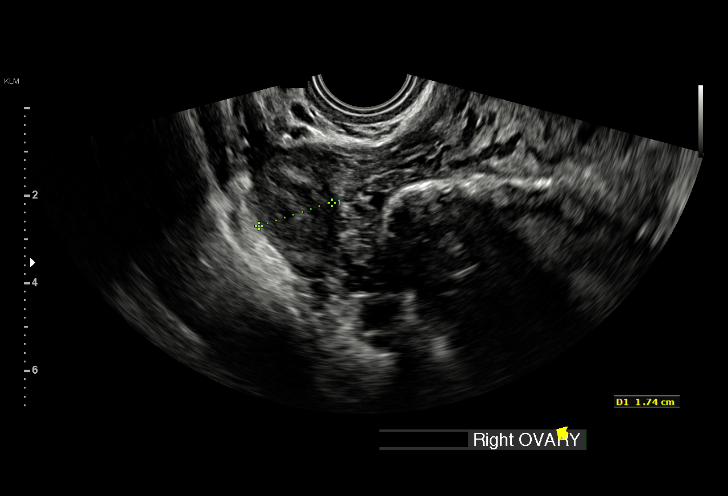
[im 30/33]
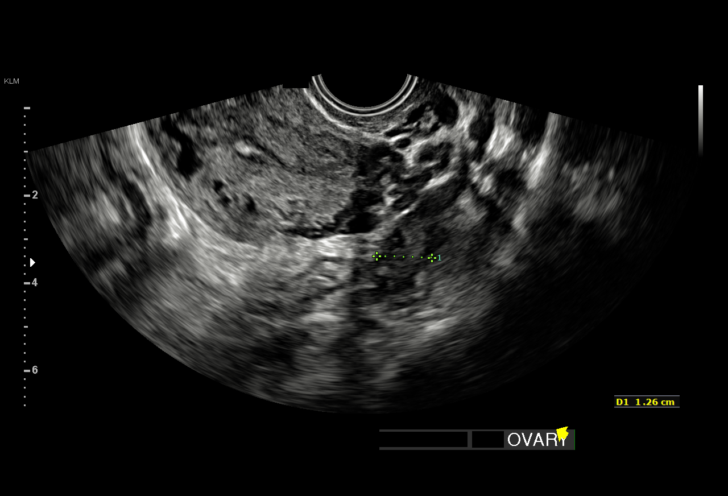
[im 33/33]
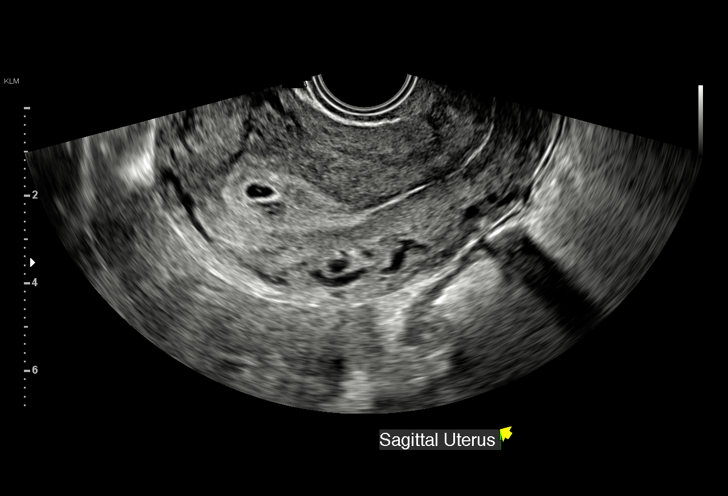

[15 of 28 positions shown; findings below may reference images not displayed]

FINDINGS: Intrauterine gestational sac: Single

Yolk sac:  Yes

Embryo:  Not Visualized.

Cardiac Activity: Not Visualized.

MSD: 6.5 mm   5 w   2 d

Maternal uterus/adnexae:

Subchorionic hemorrhage: None

Right ovary: Appears normal containing a corpus luteal cyst

Left ovary: Appears normal.

Other :None

Free fluid:  None
IMPRESSION: 1. Probable early intrauterine gestational sac with a yolk sac, but
there is no fetal pole, or cardiac activity yet visualized.
Recommend follow-up quantitative B-HCG levels and follow-up US in 14
days to confirm and assess viability. This recommendation follows
SRU consensus guidelines: Diagnostic Criteria for Nonviable
Pregnancy Early in the First Trimester. N Engl J Med 3614;

## 2019-10-17 IMAGING — US US OB TRANSVAGINAL
1 series · 15 of 28 positions shown · non-contrast
Comparison: 05/29/2017

CLINICAL DATA: Pregnancy of unknown anatomic location

EXAM:
TRANSVAGINAL OB ULTRASOUND
TECHNIQUE: Transvaginal ultrasound was performed for complete evaluation of the
gestation as well as the maternal uterus, adnexal regions, and
pelvic cul-de-sac.

[Series 1: us ob transvaginal · 15 of 49 slices shown]
[im 1/49]
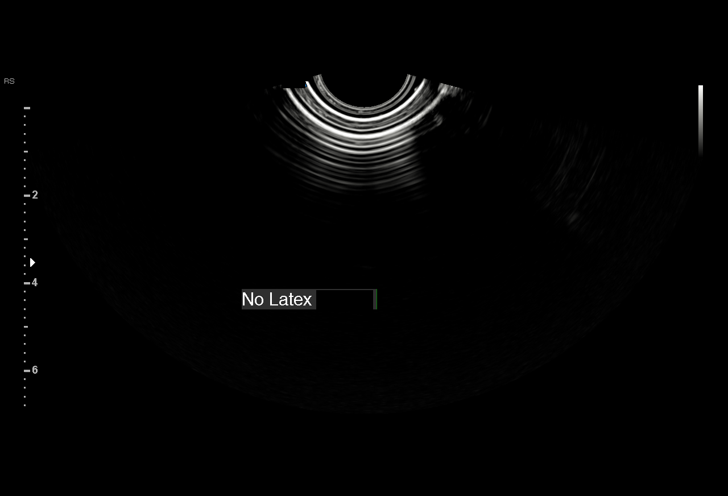
[im 4/49]
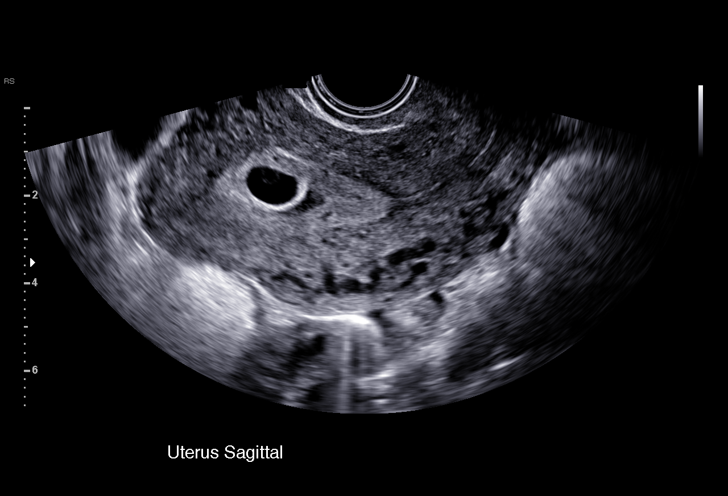
[im 8/49]
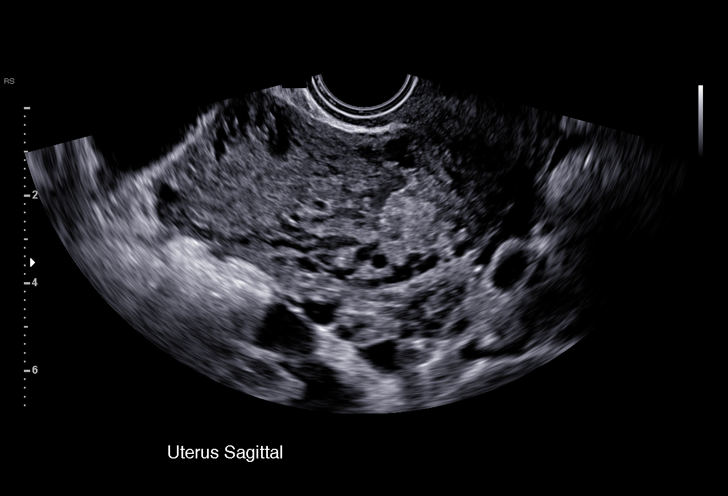
[im 11/49]
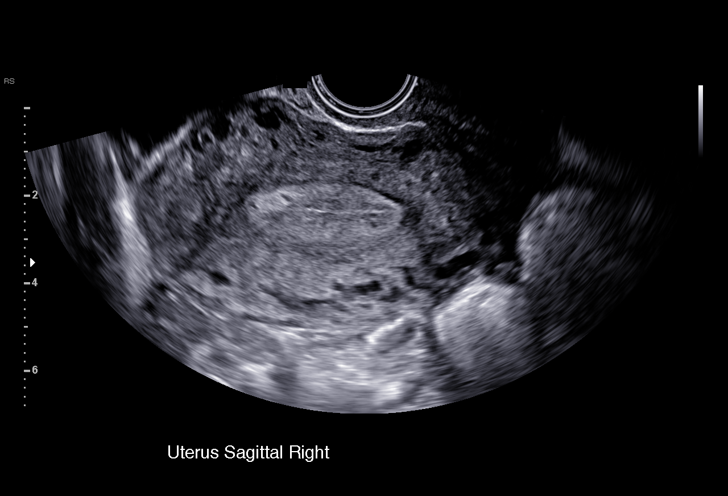
[im 15/49]
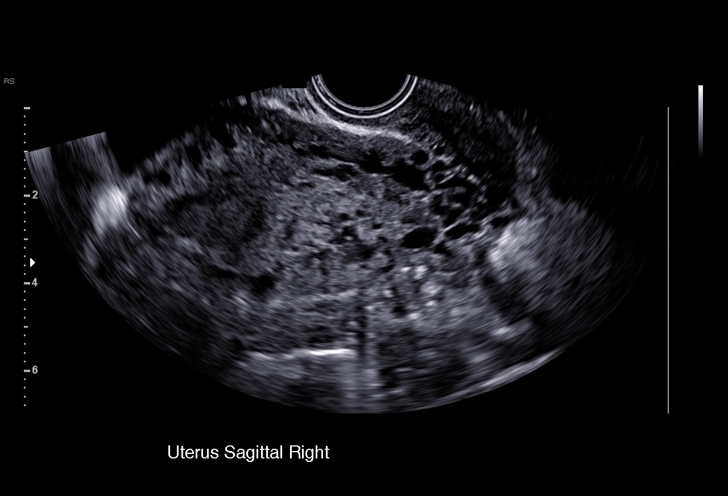
[im 18/49]
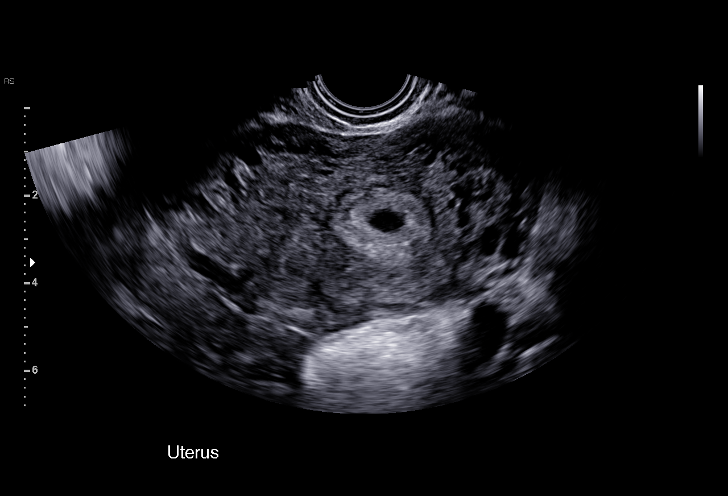
[im 22/49]
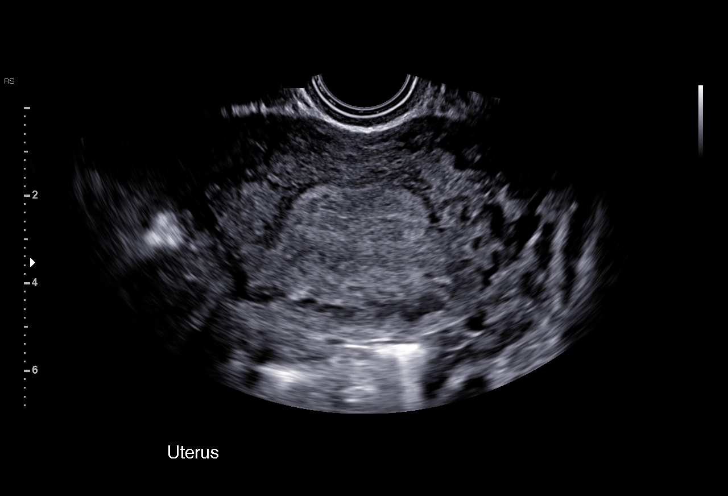
[im 25/49]
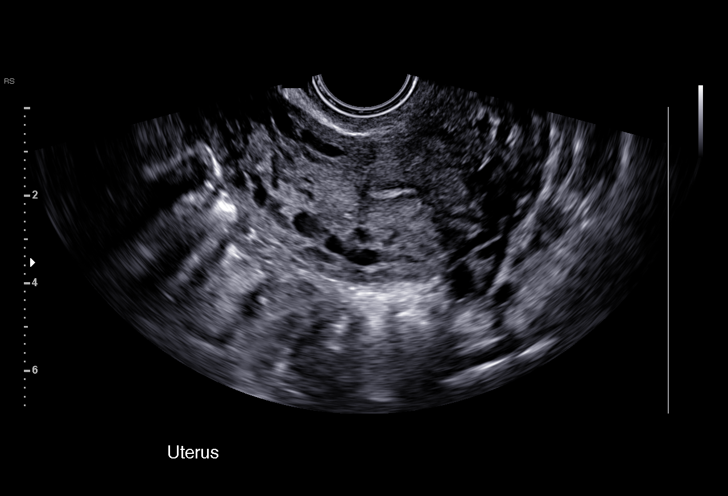
[im 27/49]
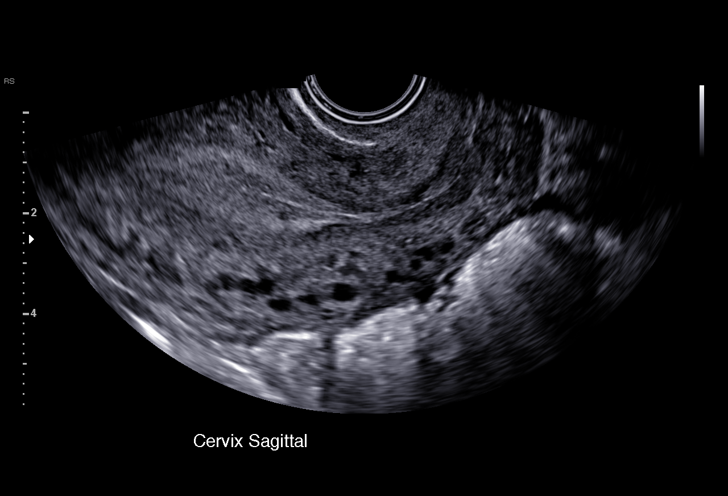
[im 31/49]
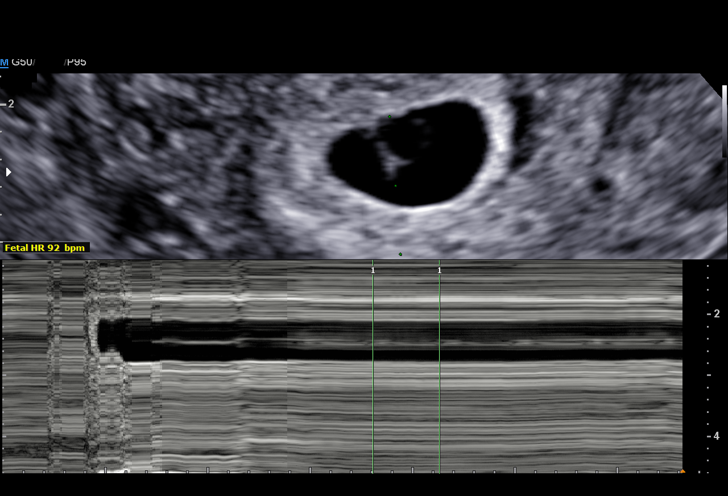
[im 34/49]
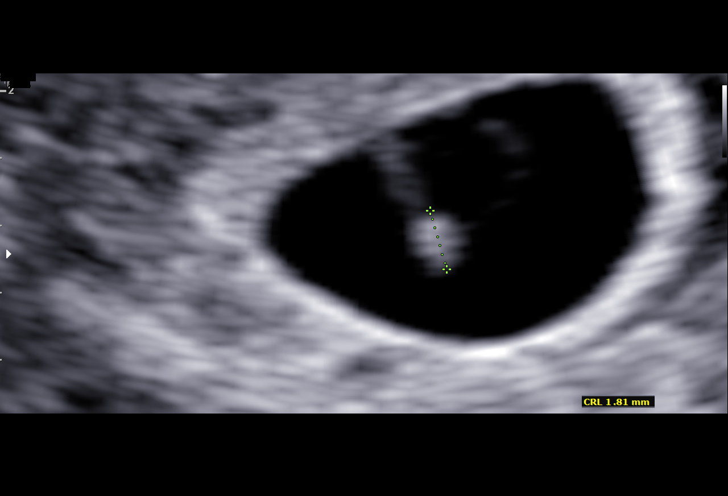
[im 38/49]
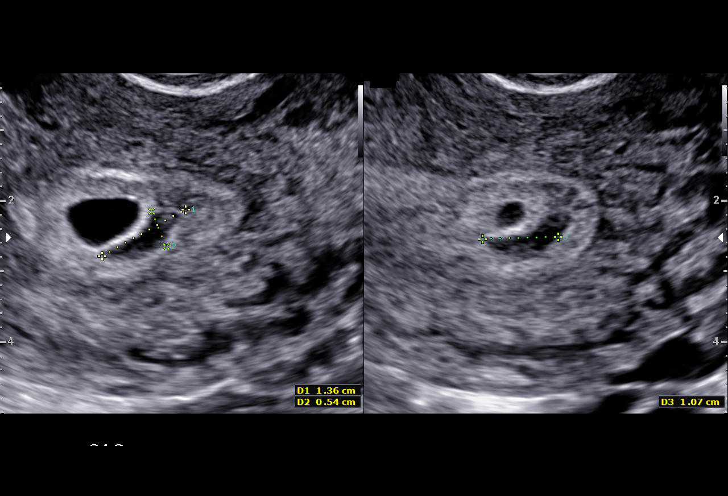
[im 41/49]
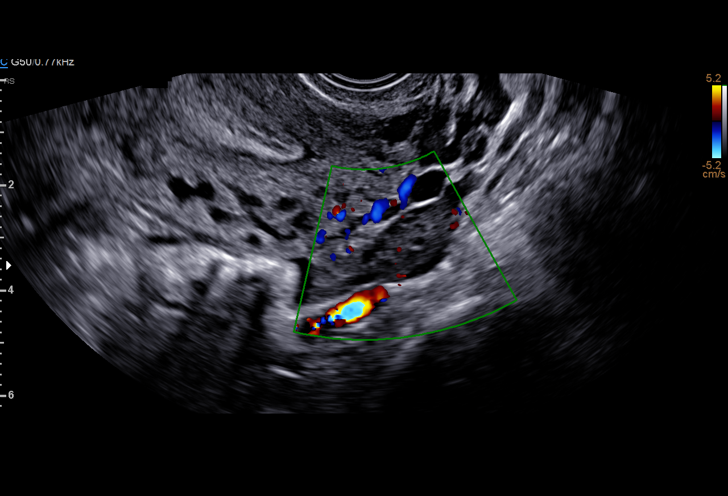
[im 45/49]
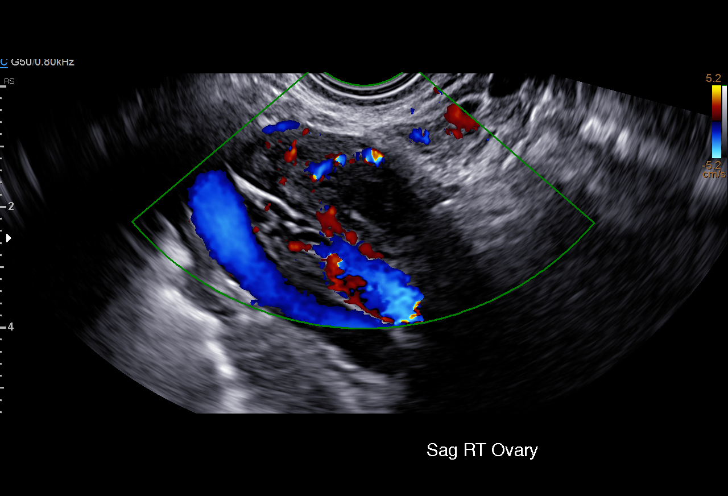
[im 49/49]
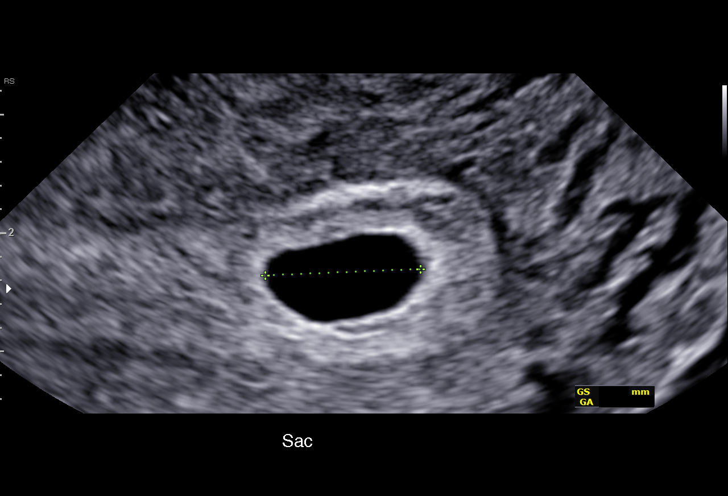

[15 of 28 positions shown; findings below may reference images not displayed]

FINDINGS: Intrauterine gestational sac: Present

Yolk sac:  Present

Embryo:  Present

Cardiac Activity: Present

Heart Rate: 92 bpm

MSD: 10.3 mm corresponding to 5 w 5 d EGA

CRL: 1.8 mm (below range of chart for EGA) w d US EDC:

Subchorionic hemorrhage:  Small subchronic hemorrhage 14 x 5 x 11 mm

Maternal uterus/adnexae:

LEFT ovary normal size and morphology 2.8 x 1.0 x 1.3 cm.

RIGHT ovary measures 3.7 x 1.7 x 2.0 cm and contains a small corpus
luteal cyst.

Trace free pelvic fluid.

No adnexal masses.
IMPRESSION: Single live intrauterine gestation as above.

Small subchronic hemorrhage.

## 2020-01-05 ENCOUNTER — Emergency Department (HOSPITAL_COMMUNITY): Payer: Self-pay

## 2020-01-05 ENCOUNTER — Emergency Department (HOSPITAL_COMMUNITY)
Admission: EM | Admit: 2020-01-05 | Discharge: 2020-01-05 | Disposition: A | Discharge status: Home / Self Care | Payer: Self-pay | Attending: Emergency Medicine | Admitting: Emergency Medicine

## 2020-01-05 ENCOUNTER — Encounter (HOSPITAL_COMMUNITY): Payer: Self-pay | Admitting: Emergency Medicine

## 2020-01-05 ENCOUNTER — Other Ambulatory Visit: Payer: Self-pay

## 2020-01-05 DIAGNOSIS — M79604 Pain in right leg: Secondary | ICD-10-CM | POA: Insufficient documentation

## 2020-01-05 DIAGNOSIS — Z87891 Personal history of nicotine dependence: Secondary | ICD-10-CM | POA: Insufficient documentation

## 2020-01-05 DIAGNOSIS — Y92009 Unspecified place in unspecified non-institutional (private) residence as the place of occurrence of the external cause: Secondary | ICD-10-CM | POA: Insufficient documentation

## 2020-01-05 DIAGNOSIS — R52 Pain, unspecified: Secondary | ICD-10-CM

## 2020-01-05 DIAGNOSIS — W108XXA Fall (on) (from) other stairs and steps, initial encounter: Secondary | ICD-10-CM | POA: Insufficient documentation

## 2020-01-05 MED ORDER — METHOCARBAMOL 500 MG PO TABS
500.0000 mg | ORAL_TABLET | Freq: Once | ORAL | Status: AC
  Administered 2020-01-05: 500 mg via ORAL
  Filled 2020-01-05: qty 1

## 2020-01-05 MED ORDER — ACETAMINOPHEN 325 MG PO TABS
650.0000 mg | ORAL_TABLET | Freq: Once | ORAL | Status: AC
  Administered 2020-01-05: 650 mg via ORAL
  Filled 2020-01-05: qty 2

## 2020-01-05 MED ORDER — METHOCARBAMOL 500 MG PO TABS: 500.0000 mg | ORAL_TABLET | Freq: Two times a day (BID) | ORAL | 0 refills | Status: DC

## 2020-01-05 MED ORDER — NAPROXEN 500 MG PO TABS: 500.0000 mg | ORAL_TABLET | Freq: Two times a day (BID) | ORAL | 0 refills | Status: DC

## 2020-01-05 NOTE — ED Provider Notes (Signed)
[facility] EMERGENCY DEPARTMENT Provider Note   CSN: [id number] Arrival date & time: 01/05/20  1312     History Chief Complaint  Patient presents with  . Fall  . Leg Pain    Julie Cline is a 24 y.o. female with no significant past medical history presents to the ED due to right leg pain x1 day.  Patient states she fell down a few stairs earlier this morning.  Denies head injury or loss of consciousness.  Patient notes pain is worse with movement, especially ambulation.  No treatment prior to arrival.  No previous injury.  Denies numbness/tingling right lower extremity.  History obtained from patient and past medical records. No interpreter used during encounter.      Past Medical History:  Diagnosis Date  . Chlamydia   . Gonorrhea 07/05/2016   To go to HD for tx  . Urinary tract infection     Patient Active Problem List   Diagnosis Date Noted  . Nexplanon insertion 02/10/2018  . History of preterm delivery 01/07/2018  . Normal labor 01/05/2018  . BV (bacterial vaginosis) 12/18/2017  . Supervision of normal pregnancy 04/30/2016  . History of placenta abruption 04/30/2016    Past Surgical History:  Procedure Laterality Date  . EYE SURGERY     Eye lid  . HERNIA REPAIR       OB History    Gravida  3   Para  2   Term  1   Preterm  1   AB  1   Living  2     SAB  1   TAB  0   Ectopic  0   Multiple  0   Live Births  2           Family History  Problem Relation Age of Onset  . Diabetes Maternal Uncle   . Prostate cancer Paternal Grandfather   . Alcohol abuse Neg Hx   . Arthritis Neg Hx   . Asthma Neg Hx   . Birth defects Neg Hx   . Cancer Neg Hx   . COPD Neg Hx   . Depression Neg Hx   . Drug abuse Neg Hx   . Early death Neg Hx   . Hearing loss Neg Hx   . Heart disease Neg Hx   . Hyperlipidemia Neg Hx   . Hypertension Neg Hx   . Kidney disease Neg Hx   . Learning disabilities Neg Hx   . Mental illness Neg Hx    . Mental retardation Neg Hx   . Miscarriages / Stillbirths Neg Hx   . Stroke Neg Hx   . Vision loss Neg Hx   . Varicose Veins Neg Hx     Social History   Tobacco Use  . Smoking status: Former Smoker    Packs/day: 0.25    Years: 6.00    Pack years: 1.50    Types: Cigarettes    Quit date: 01/27/2013    Years since quitting: 6.9  . Smokeless tobacco: Never Used  Vaping Use  . Vaping Use: Never used  Substance Use Topics  . Alcohol use: No  . Drug use: Yes    Frequency: 7.0 times per week    Types: Marijuana    Comment: used 08/20/2017    Home Medications Prior to Admission medications   Medication Sig Start Date End Date Taking? Authorizing Provider  acetaminophen (TYLENOL) 500 MG tablet Take 500 mg by mouth every 6 (  six) hours as needed for mild pain.    [provider]  methocarbamol (ROBAXIN) 500 MG tablet Take 1 tablet (500 mg total) by mouth 2 (two) times daily. 01/05/20   Mannie Stabile, PA-C  naproxen (NAPROSYN) 500 MG tablet Take 1 tablet (500 mg total) by mouth 2 (two) times daily. 01/05/20   Mannie Stabile, PA-C  ondansetron (ZOFRAN ODT) 4 MG disintegrating tablet Take 1 tablet (4 mg total) by mouth every 8 (eight) hours as needed for nausea. 03/31/18   Garlon Hatchet, PA-C  Prenatal MV-Min-FA-Omega-3 (PRENATAL GUMMIES/DHA & FA PO) Take 2 each by mouth daily.     [provider]    Allergies    Patient has no known allergies.  Review of Systems   Review of Systems  Musculoskeletal: Positive for arthralgias and gait problem. Negative for back pain, joint swelling and neck pain.  Neurological: Negative for numbness.    Physical Exam Updated Vital Signs BP 130/78 (BP Location: Right Arm)   Pulse 79   Temp 98.3 F (36.8 C) (Oral)   Resp 12   LMP 12/23/2019 Comment: no chance of pregnancy  SpO2 99%   Physical Exam Vitals and nursing note reviewed.  Constitutional:      General: She is not in acute distress.    Appearance: She is  not ill-appearing.  HENT:     Head: Normocephalic.  Eyes:     Pupils: Pupils are equal, round, and reactive to light.  Neck:     Comments: No cervical midline tenderness. Cardiovascular:     Rate and Rhythm: Normal rate and regular rhythm.     Pulses: Normal pulses.     Heart sounds: Normal heart sounds. No murmur heard.  No friction rub. No gallop.   Pulmonary:     Effort: Pulmonary effort is normal.     Breath sounds: Normal breath sounds.  Abdominal:     General: Abdomen is flat. There is no distension.     Palpations: Abdomen is soft.     Tenderness: There is no abdominal tenderness. There is no guarding.  Musculoskeletal:     Cervical back: Neck supple.     Comments: No thoracic or lumbar midline tenderness.  Tenderness throughout anterior thigh.  Limited range of motion of right knee due to pain. Distal pulses and sensation intact.   Skin:    General: Skin is warm and dry.  Neurological:     General: No focal deficit present.     Mental Status: She is alert.  Psychiatric:        Mood and Affect: Mood normal.        Behavior: Behavior normal.     ED Results / Procedures / Treatments   Labs (all labs ordered are listed, but only abnormal results are displayed) Labs Reviewed - No data to display  EKG None  Radiology DG FEMUR, MIN 2 VIEWS RIGHT  Result Date: 01/05/2020 CLINICAL DATA:  Entire right femur pain EXAM: RIGHT FEMUR 2 VIEWS COMPARISON:  None. FINDINGS: There is no evidence of fracture or other focal bone lesions. Soft tissues are unremarkable. IMPRESSION: Negative. Electronically Signed   By: Jasmine Pang M.D.   On: 01/05/2020 16:08    Procedures Procedures (including critical care time)  Medications Ordered in ED Medications  acetaminophen (TYLENOL) tablet 650 mg (has no administration in time range)  methocarbamol (ROBAXIN) tablet 500 mg (has no administration in time range)    ED Course  I have reviewed  the triage vital signs and the nursing  notes.  Pertinent labs & imaging results that were available during my care of the patient were reviewed by me and considered in my medical decision making (see chart for details).    MDM Rules/Calculators/A&P                         24 year old female presents to the ED due to right lower extremity pain after falling down a few stairs earlier this morning.  No head injury or loss of consciousness.  Upon arrival, vitals all within normal limits.  Patient in no acute distress and non-ill-appearing.  Physical exam reassuring.  Tenderness throughout anterior thigh. RLE neurovascularly intact.  Low suspicion for compartment syndrome.  X-ray ordered at triage which I personally reviewed which is negative for any bony fractures.  Tylenol and Robaxin given here for pain management.  Crutches given to patient.  Patient discharged with naproxen and Robaxin. Patient denies any chance of being pregnant and notes her last menstrual cycle was 2 weeks ago. RICE discussed with patient.  Check patient obviously symptoms are likely next week. Strict ED precautions discussed with patient. Patient states understanding and agrees to plan. Patient discharged home in no acute distress and stable vitals  Final Clinical Impression(s) / ED Diagnoses Final diagnoses:  Right leg pain    Rx / DC Orders ED Discharge Orders         Ordered    naproxen (NAPROSYN) 500 MG tablet  2 times daily        01/05/20 1746    methocarbamol (ROBAXIN) 500 MG tablet  2 times daily        01/05/20 1746           Jesusita Oka 01/05/20 1832    Charlynne Pander, MD 01/05/20 2238

## 2020-01-05 NOTE — ED Triage Notes (Signed)
Pt states she fell on steps around 2am this morning.  Reports pain to R upper leg.  Difficulty bearing weight.

## 2020-01-05 NOTE — Progress Notes (Signed)
Orthopedic Tech Progress Note Patient Details:  Julie Cline 01-26-1996 [id number]  Ortho Devices Type of Ortho Device: Crutches Ortho Device/Splint Interventions: Adjustment, Application, Ordered   Post Interventions Patient Tolerated: Well Instructions Provided: Adjustment of device   [name] 01/05/2020, 6:44 PM

## 2020-01-05 NOTE — Discharge Instructions (Addendum)
As discussed, your x-ray was negative for any broken bones.  I am sending you home with a pain medication and muscle relaxer.  Do not drive or operate machinery while the muscle relaxer reserve can cause drowsiness.  Please follow-up with PCP if your symptoms do not improve within the next week.  Continue to ice and elevate your leg.  Return to the ER for new or worsening symptoms.

## 2020-01-19 ENCOUNTER — Telehealth: Payer: Self-pay | Admitting: *Deleted

## 2020-01-19 NOTE — Telephone Encounter (Signed)
Pt called to office asking how she can get her Nexplanon removed. Pt advised she may make appt to discuss with provider as she has been having AUB since she had placed.  Pt states she doesn't know if she has ins coverage at this time.  Pt advised to verify insurance, if she has it she may make appt.  If not, she may try the health dept for current problem.   Pt states understanding.

## 2021-10-18 ENCOUNTER — Encounter: Payer: Self-pay | Admitting: Obstetrics and Gynecology

## 2021-10-18 ENCOUNTER — Ambulatory Visit (INDEPENDENT_AMBULATORY_CARE_PROVIDER_SITE_OTHER): Payer: Medicaid Other | Admitting: Obstetrics and Gynecology

## 2021-10-18 ENCOUNTER — Other Ambulatory Visit: Payer: Self-pay

## 2021-10-18 VITALS — BP 112/81 | HR 63 | Ht 66.0 in | Wt 137.0 lb

## 2021-10-18 DIAGNOSIS — Z3046 Encounter for surveillance of implantable subdermal contraceptive: Secondary | ICD-10-CM | POA: Diagnosis not present

## 2021-10-18 DIAGNOSIS — Z1331 Encounter for screening for depression: Secondary | ICD-10-CM

## 2021-10-18 NOTE — Progress Notes (Signed)
     GYNECOLOGY OFFICE PROCEDURE NOTE  Julie Cline is a 26 y.o. [phone] here for Nexplanon removal Nexplanon.  Last pap smear was on 4/18 and was normal.  Pt declined pap smear today, but will return in a month or so for annual exam.  No other gynecologic concerns.   Nexplanon Removal Patient identified, informed consent performed, consent signed.   Appropriate time out taken. Nexplanon site identified.  Area prepped in usual sterile fashon. One ml of 1% lidocaine was used to anesthetize the area at the distal end of the implant. A small stab incision was made right beside the implant on the distal portion.  The Nexplanon rod was grasped using hemostats and removed without difficulty.  There was minimal blood loss. There were no complications.  3 ml of 1% lidocaine was injected around the incision for post-procedure analgesia.  Steri-strips were applied over the small incision.  A pressure bandage was applied to reduce any bruising.  The patient tolerated the procedure well and was given post procedure instructions.  Patient is planning to use nothing for contraception.  Annual exam in 1 month or sooner if possible.  Lynnda Shields, MD, [place] for [facility], [place]

## 2021-10-18 NOTE — Progress Notes (Signed)
26 y.o GYN presents for Nexplanon removal. She does not want any BC at this time.  Last PAP 04/30/2016 PHQ-9=20 GAD-7=11

## 2021-10-23 ENCOUNTER — Telehealth: Payer: Self-pay | Admitting: Clinical

## 2021-10-23 NOTE — Telephone Encounter (Signed)
Attempt call regarding referral; Unable to leave message as "call cannot be completed at this time" on cell phone; did not leave message with person who answered house phone.

## 2021-11-21 ENCOUNTER — Telehealth: Payer: Self-pay | Admitting: Clinical

## 2021-11-21 NOTE — Telephone Encounter (Signed)
Attempt call regarding referral; Unable to leave message as "call cannot be completed at this time".  

## 2022-03-11 ENCOUNTER — Encounter (HOSPITAL_COMMUNITY): Payer: Self-pay

## 2022-03-11 ENCOUNTER — Ambulatory Visit (HOSPITAL_COMMUNITY)
Admission: EM | Admit: 2022-03-11 | Discharge: 2022-03-11 | Disposition: A | Discharge status: Home / Self Care | Payer: Medicaid Other | Attending: Internal Medicine | Admitting: Internal Medicine

## 2022-03-11 DIAGNOSIS — A084 Viral intestinal infection, unspecified: Secondary | ICD-10-CM

## 2022-03-11 MED ORDER — LACTINEX PO CHEW: 1.0000 | CHEWABLE_TABLET | Freq: Three times a day (TID) | ORAL | 0 refills | Status: DC

## 2022-03-11 NOTE — ED Triage Notes (Signed)
Pt is here for vomiting and abdominal pain x 1wk, leg pain since last night

## 2022-03-11 NOTE — Discharge Instructions (Addendum)
Continue to increase oral fluid intake Take medication as prescribed This is likely a viral event and will subside spontaneously Return to urgent care if you have any other concerns.

## 2022-03-13 NOTE — ED Provider Notes (Signed)
[facility]    CSN: [id number] Arrival date & time: 03/11/22  1124      History   Chief Complaint Chief Complaint  Patient presents with   Emesis   Abdominal Pain   Leg Pain    HPI Julie Cline is a 27 y.o. female reports to the urgent care with 1 week history of abdominal pain, nausea, vomiting and diarrhea.  Patient's symptoms started insidiously and has been ongoing for the past week.  Stools were initially watery but is currently loose.  She continues to have nonbloody nonbilious vomiting.  Abdominal pain is crampy in nature.  No abdominal distention.  No fever or chills.  Patient continues to maintain oral intake and appetite is intact.Marland Kitchen   HPI  Past Medical History:  Diagnosis Date   Chlamydia    Gonorrhea 07/05/2016   To go to HD for tx   Urinary tract infection     Patient Active Problem List   Diagnosis Date Noted   Positive screening for depression on 9-item Patient Health Questionnaire (PHQ-9) 10/18/2021   Nexplanon insertion 02/10/2018   History of preterm delivery 01/07/2018   Normal labor 01/05/2018   BV (bacterial vaginosis) 12/18/2017   Supervision of normal pregnancy 04/30/2016   History of placenta abruption 04/30/2016    Past Surgical History:  Procedure Laterality Date   EYE SURGERY     Eye lid   HERNIA REPAIR      OB History     Gravida  3   Para  2   Term  1   Preterm  1   AB  1   Living  2      SAB  1   IAB  0   Ectopic  0   Multiple  0   Live Births  2            Home Medications    Prior to Admission medications   Medication Sig Start Date End Date Taking? Authorizing Provider  lactobacillus acidophilus & bulgar (LACTINEX) chewable tablet Chew 1 tablet by mouth 3 (three) times daily with meals. 03/11/22  Yes Damareon Lanni, Myrene Galas, MD    Family History Family History  Problem Relation Age of Onset   Diabetes Maternal Uncle    Prostate cancer Paternal Grandfather    Alcohol abuse Neg Hx     Arthritis Neg Hx    Asthma Neg Hx    Birth defects Neg Hx    Cancer Neg Hx    COPD Neg Hx    Depression Neg Hx    Drug abuse Neg Hx    Early death Neg Hx    Hearing loss Neg Hx    Heart disease Neg Hx    Hyperlipidemia Neg Hx    Hypertension Neg Hx    Kidney disease Neg Hx    Learning disabilities Neg Hx    Mental illness Neg Hx    Mental retardation Neg Hx    Miscarriages / Stillbirths Neg Hx    Stroke Neg Hx    Vision loss Neg Hx    Varicose Veins Neg Hx     Social History Social History   Tobacco Use   Smoking status: Former    Packs/day: 0.25    Years: 6.00    Total pack years: 1.50    Types: Cigarettes    Quit date: 01/27/2013    Years since quitting: 9.1   Smokeless tobacco: Never  Vaping Use   Vaping Use:  Never used  Substance Use Topics   Alcohol use: No   Drug use: Yes    Frequency: 7.0 times per week    Types: Marijuana    Comment: used 08/20/2017     Allergies   Patient has no known allergies.   Review of Systems Review of Systems As per HPI  Physical Exam Triage Vital Signs ED Triage Vitals [03/11/22 1415]  Enc Vitals Group     BP 123/80     Pulse Rate (!) 57     Resp 12     Temp 98.2 F (36.8 C)     Temp Source Oral     SpO2 98 %     Weight      Height      Head Circumference      Peak Flow      Pain Score      Pain Loc      Pain Edu?      Excl. in [place]?    No data found.  Updated Vital Signs BP 123/80 (BP Location: Right Arm)   Pulse (!) 57   Temp 98.2 F (36.8 C) (Oral)   Resp 12   LMP 02/11/2022   SpO2 98%   Visual Acuity Right Eye Distance:   Left Eye Distance:   Bilateral Distance:    Right Eye Near:   Left Eye Near:    Bilateral Near:     Physical Exam Vitals and nursing note reviewed.  Constitutional:      General: She is not in acute distress.    Appearance: She is not ill-appearing.  Abdominal:     General: Abdomen is flat. Bowel sounds are normal.     Palpations: Abdomen is soft. There is no  shifting dullness, hepatomegaly, splenomegaly or mass.     Tenderness: There is no abdominal tenderness. There is no guarding or rebound.     Hernia: No hernia is present.  Neurological:     Mental Status: She is alert.      UC Treatments / Results  Labs (all labs ordered are listed, but only abnormal results are displayed) Labs Reviewed - No data to display  EKG   Radiology No results found.  Procedures Procedures (including critical care time)  Medications Ordered in UC Medications - No data to display  Initial Impression / Assessment and Plan / UC Course  I have reviewed the triage vital signs and the nursing notes.  Pertinent labs & imaging results that were available during my care of the patient were reviewed by me and considered in my medical decision making (see chart for details).     1.  Viral gastroenteritis: Increase oral fluid intake Probiotics Patient is advised against taking Imodium to stop diarrhea Return precautions given No labs or testing indicated. Final Clinical Impressions(s) / UC Diagnoses   Final diagnoses:  Viral gastroenteritis     Discharge Instructions      Continue to increase oral fluid intake Take medication as prescribed This is likely a viral event and will subside spontaneously Return to urgent care if you have any other concerns.   ED Prescriptions     Medication Sig Dispense Auth. Provider   lactobacillus acidophilus & bulgar (LACTINEX) chewable tablet Chew 1 tablet by mouth 3 (three) times daily with meals. 30 tablet Chala Gul, Myrene Galas, MD      PDMP not reviewed this encounter.   Chase Picket, MD 03/13/22 2213

## 2022-07-28 ENCOUNTER — Emergency Department (HOSPITAL_COMMUNITY): Payer: Medicaid Other

## 2022-07-28 ENCOUNTER — Emergency Department (HOSPITAL_COMMUNITY)
Admission: EM | Admit: 2022-07-28 | Discharge: 2022-07-28 | Disposition: A | Discharge status: Home / Self Care | Payer: Medicaid Other | Attending: Emergency Medicine | Admitting: Emergency Medicine

## 2022-07-28 ENCOUNTER — Other Ambulatory Visit: Payer: Self-pay

## 2022-07-28 ENCOUNTER — Encounter (HOSPITAL_COMMUNITY): Payer: Self-pay | Admitting: Emergency Medicine

## 2022-07-28 ENCOUNTER — Ambulatory Visit
Admission: EM | Admit: 2022-07-28 | Discharge: 2022-07-28 | Disposition: A | Discharge status: Other Acute Inpatient Hospital | Payer: Medicaid Other | Attending: Internal Medicine | Admitting: Internal Medicine

## 2022-07-28 DIAGNOSIS — Z3202 Encounter for pregnancy test, result negative: Secondary | ICD-10-CM | POA: Diagnosis not present

## 2022-07-28 DIAGNOSIS — N760 Acute vaginitis: Secondary | ICD-10-CM | POA: Diagnosis not present

## 2022-07-28 DIAGNOSIS — N739 Female pelvic inflammatory disease, unspecified: Secondary | ICD-10-CM | POA: Diagnosis not present

## 2022-07-28 DIAGNOSIS — E876 Hypokalemia: Secondary | ICD-10-CM | POA: Insufficient documentation

## 2022-07-28 DIAGNOSIS — R1030 Lower abdominal pain, unspecified: Secondary | ICD-10-CM | POA: Diagnosis present

## 2022-07-28 DIAGNOSIS — M545 Low back pain, unspecified: Secondary | ICD-10-CM

## 2022-07-28 DIAGNOSIS — B9689 Other specified bacterial agents as the cause of diseases classified elsewhere: Secondary | ICD-10-CM

## 2022-07-28 DIAGNOSIS — N730 Acute parametritis and pelvic cellulitis: Secondary | ICD-10-CM

## 2022-07-28 LAB — COMPREHENSIVE METABOLIC PANEL
ALT: 13 U/L (ref 0–44)
AST: 15 U/L (ref 15–41)
Albumin: 3.5 g/dL (ref 3.5–5.0)
Alkaline Phosphatase: 55 U/L (ref 38–126)
Anion gap: 7 (ref 5–15)
BUN: 12 mg/dL (ref 6–20)
CO2: 27 mmol/L (ref 22–32)
Calcium: 8.8 mg/dL — ABNORMAL LOW (ref 8.9–10.3)
Chloride: 102 mmol/L (ref 98–111)
Creatinine, Ser: 0.8 mg/dL (ref 0.44–1.00)
GFR, Estimated: >60 mL/min (ref >60)
Glucose, Bld: 120 mg/dL — ABNORMAL HIGH (ref 70–99)
Potassium: 3.4 mmol/L — ABNORMAL LOW (ref 3.5–5.1)
Sodium: 136 mmol/L (ref 135–145)
Total Bilirubin: 0.4 mg/dL (ref 0.3–1.2)
Total Protein: 6.7 g/dL (ref 6.5–8.1)

## 2022-07-28 LAB — CBC
HCT: 39.9 % (ref 36.0–46.0)
Hemoglobin: 13.3 g/dL (ref 12.0–15.0)
MCH: 31.7 pg (ref 26.0–34.0)
MCHC: 33.3 g/dL (ref 30.0–36.0)
MCV: 95.2 fL (ref 80.0–100.0)
Platelets: 311 10*3/uL (ref 150–400)
RBC: 4.19 MIL/uL (ref 3.87–5.11)
RDW: 13.3 % (ref 11.5–15.5)
WBC: 8.7 10*3/uL (ref 4.0–10.5)
nRBC: 0 % (ref 0.0–0.2)

## 2022-07-28 LAB — URINALYSIS, ROUTINE W REFLEX MICROSCOPIC
Bilirubin Urine: NEGATIVE
Glucose, UA: NEGATIVE mg/dL
Ketones, ur: NEGATIVE mg/dL
Nitrite: NEGATIVE
Protein, ur: 30 mg/dL — AB
Specific Gravity, Urine: 1.028 (ref 1.005–1.030)
WBC, UA: >50 WBC/hpf (ref 0–5)
pH: 6 (ref 5.0–8.0)

## 2022-07-28 LAB — HIV ANTIBODY (ROUTINE TESTING W REFLEX): HIV Screen 4th Generation wRfx: NONREACTIVE

## 2022-07-28 LAB — HCG, SERUM, QUALITATIVE: Preg, Serum: NEGATIVE

## 2022-07-28 LAB — WET PREP, GENITAL
Sperm: NONE SEEN
Trich, Wet Prep: NONE SEEN
WBC, Wet Prep HPF POC: >=10 — AB (ref <10)
Yeast Wet Prep HPF POC: NONE SEEN

## 2022-07-28 LAB — LIPASE, BLOOD: Lipase: 31 U/L (ref 11–51)

## 2022-07-28 LAB — POCT URINE PREGNANCY: Preg Test, Ur: NEGATIVE

## 2022-07-28 MED ORDER — IOHEXOL 350 MG/ML SOLN
75.0000 mL | Freq: Once | INTRAVENOUS | Status: AC | PRN
  Administered 2022-07-28: 75 mL via INTRAVENOUS

## 2022-07-28 MED ORDER — FENTANYL CITRATE PF 50 MCG/ML IJ SOSY: 50.0000 ug | PREFILLED_SYRINGE | Freq: Once | INTRAMUSCULAR | Status: DC

## 2022-07-28 MED ORDER — DOXYCYCLINE HYCLATE 100 MG PO TABS: 100.0000 mg | ORAL_TABLET | Freq: Two times a day (BID) | ORAL | 0 refills | Status: DC

## 2022-07-28 MED ORDER — SODIUM CHLORIDE 0.9 % IV SOLN
1.0000 g | Freq: Once | INTRAVENOUS | Status: AC
  Administered 2022-07-28: 1 g via INTRAVENOUS
  Filled 2022-07-28: qty 10

## 2022-07-28 MED ORDER — FENTANYL CITRATE PF 50 MCG/ML IJ SOSY: 50.0000 ug | PREFILLED_SYRINGE | INTRAMUSCULAR | Status: DC | PRN

## 2022-07-28 MED ORDER — HYDROCODONE-ACETAMINOPHEN 5-325 MG PO TABS: 2.0000 | ORAL_TABLET | ORAL | 0 refills | Status: DC | PRN

## 2022-07-28 MED ORDER — FENTANYL CITRATE PF 50 MCG/ML IJ SOSY
50.0000 ug | PREFILLED_SYRINGE | Freq: Once | INTRAMUSCULAR | Status: AC
  Administered 2022-07-28: 50 ug via INTRAVENOUS
  Filled 2022-07-28: qty 1

## 2022-07-28 MED ORDER — ONDANSETRON HCL 4 MG/2ML IJ SOLN
4.0000 mg | Freq: Three times a day (TID) | INTRAMUSCULAR | Status: DC | PRN
  Administered 2022-07-28: 4 mg via INTRAVENOUS
  Filled 2022-07-28: qty 2

## 2022-07-28 MED ORDER — HYDROCODONE-ACETAMINOPHEN 5-325 MG PO TABS
2.0000 | ORAL_TABLET | Freq: Once | ORAL | Status: AC
  Administered 2022-07-28: 2 via ORAL
  Filled 2022-07-28: qty 2

## 2022-07-28 MED ORDER — CEPHALEXIN 500 MG PO CAPS: 500.0000 mg | ORAL_CAPSULE | Freq: Two times a day (BID) | ORAL | 0 refills | Status: DC

## 2022-07-28 MED ORDER — CEPHALEXIN 500 MG PO CAPS: 500.0000 mg | ORAL_CAPSULE | Freq: Two times a day (BID) | ORAL | 0 refills | Status: AC

## 2022-07-28 MED ORDER — METRONIDAZOLE 500 MG PO TABS: 500.0000 mg | ORAL_TABLET | Freq: Two times a day (BID) | ORAL | 0 refills | Status: DC

## 2022-07-28 MED ORDER — DOXYCYCLINE HYCLATE 100 MG PO TABS
100.0000 mg | ORAL_TABLET | Freq: Once | ORAL | Status: AC
  Administered 2022-07-28: 100 mg via ORAL
  Filled 2022-07-28: qty 1

## 2022-07-28 MED ORDER — LIDOCAINE HCL (PF) 1 % IJ SOLN: 1.0000 mL | Freq: Once | INTRAMUSCULAR | Status: DC

## 2022-07-28 MED ORDER — ONDANSETRON 4 MG PO TBDP: 4.0000 mg | ORAL_TABLET | Freq: Three times a day (TID) | ORAL | 0 refills | Status: DC | PRN

## 2022-07-28 MED ORDER — DOXYCYCLINE HYCLATE 100 MG PO TABS: 100.0000 mg | ORAL_TABLET | Freq: Two times a day (BID) | ORAL | 0 refills | Status: AC

## 2022-07-28 MED ORDER — LACTATED RINGERS IV BOLUS
1000.0000 mL | Freq: Once | INTRAVENOUS | Status: AC
  Administered 2022-07-28: 1000 mL via INTRAVENOUS

## 2022-07-28 MED ORDER — HYDROCODONE-ACETAMINOPHEN 5-325 MG PO TABS: 1.0000 | ORAL_TABLET | Freq: Once | ORAL | Status: DC

## 2022-07-28 MED ORDER — ONDANSETRON HCL 4 MG/2ML IJ SOLN: 4.0000 mg | Freq: Once | INTRAMUSCULAR | Status: DC

## 2022-07-28 MED ORDER — METRONIDAZOLE 500 MG PO TABS: 500.0000 mg | ORAL_TABLET | Freq: Two times a day (BID) | ORAL | 0 refills | Status: AC

## 2022-07-28 MED ORDER — CEFTRIAXONE SODIUM 500 MG IJ SOLR: 500.0000 mg | Freq: Once | INTRAMUSCULAR | Status: DC

## 2022-07-28 NOTE — Discharge Instructions (Addendum)
Take Doxycycline and Flagyl for the next 14 days.  You are being treated empirically for PID.  Continue to orally rehydrate. Dont drink alcohol while taking Flagyl as this can cause a bad reaction

## 2022-07-28 NOTE — ED Triage Notes (Signed)
Pt states that she has some abdominal pain and low back pain. X5 days

## 2022-07-28 NOTE — ED Provider Notes (Signed)
[facility]    CSN: [id number] Arrival date & time: 07/28/22  1432      History   Chief Complaint Chief Complaint  Patient presents with   Abdominal Pain    X5 days Abdominal pain and low back pain.    HPI Julie Cline is a 27 y.o. female.   Patient presents with lower abdominal pain and lower back pain that started about 5 days ago.  Patient reports abdominal pain is cramping, rated 7/10 on pain scale, and constant.  Reports laying on her stomach alleviates the pain.  Back pain is midline.  Denies dysuria, hematuria, abnormal vaginal bleeding but does report some vaginal discharge that started today that is "creamy in nature".  Denies exposure to STD but patient has had unprotected intercourse.  Last menstrual cycle was approximately 1 week ago.. Denies nausea, vomiting, diarrhea and is having normal bowel movements with no blood in stool.  Denies fever.   Abdominal Pain   Past Medical History:  Diagnosis Date   Chlamydia    Gonorrhea 07/05/2016   To go to HD for tx   Urinary tract infection     Patient Active Problem List   Diagnosis Date Noted   Positive screening for depression on 9-item Patient Health Questionnaire (PHQ-9) 10/18/2021   Nexplanon insertion 02/10/2018   History of preterm delivery 01/07/2018   Normal labor 01/05/2018   BV (bacterial vaginosis) 12/18/2017   Supervision of normal pregnancy 04/30/2016   History of placenta abruption 04/30/2016    Past Surgical History:  Procedure Laterality Date   EYE SURGERY     Eye lid   HERNIA REPAIR      OB History     Gravida  3   Para  2   Term  1   Preterm  1   AB  1   Living  2      SAB  1   IAB  0   Ectopic  0   Multiple  0   Live Births  2            Home Medications    Prior to Admission medications   Medication Sig Start Date End Date Taking? Authorizing Provider  lactobacillus acidophilus & bulgar (LACTINEX) chewable tablet Chew 1 tablet by mouth 3  (three) times daily with meals. 03/11/22   LampteyBritta Mccreedy, MD    Family History Family History  Problem Relation Age of Onset   Diabetes Maternal Uncle    Prostate cancer Paternal Grandfather    Alcohol abuse Neg Hx    Arthritis Neg Hx    Asthma Neg Hx    Birth defects Neg Hx    Cancer Neg Hx    COPD Neg Hx    Depression Neg Hx    Drug abuse Neg Hx    Early death Neg Hx    Hearing loss Neg Hx    Heart disease Neg Hx    Hyperlipidemia Neg Hx    Hypertension Neg Hx    Kidney disease Neg Hx    Learning disabilities Neg Hx    Mental illness Neg Hx    Mental retardation Neg Hx    Miscarriages / Stillbirths Neg Hx    Stroke Neg Hx    Vision loss Neg Hx    Varicose Veins Neg Hx     Social History Social History   Tobacco Use   Smoking status: Former    Packs/day: 0.25    Years:  6.00    Additional pack years: 0.00    Total pack years: 1.50    Types: Cigarettes    Quit date: 01/27/2013    Years since quitting: 9.5   Smokeless tobacco: Never  Vaping Use   Vaping Use: Never used  Substance Use Topics   Alcohol use: Yes   Drug use: Yes    Frequency: 7.0 times per week    Types: Marijuana     Allergies   Patient has no known allergies.   Review of Systems Review of Systems Per HPI  Physical Exam Triage Vital Signs ED Triage Vitals  Enc Vitals Group     BP 07/28/22 1442 115/79     Pulse Rate 07/28/22 1442 74     Resp 07/28/22 1442 19     Temp 07/28/22 1442 98.6 F (37 C)     Temp Source 07/28/22 1442 Oral     SpO2 07/28/22 1442 97 %     Weight 07/28/22 1440 135 lb (61.2 kg)     Height 07/28/22 1440 5\' 6"  (1.676 m)     Head Circumference --      Peak Flow --      Pain Score 07/28/22 1440 8     Pain Loc --      Pain Edu? --      Excl. in [place]? --    No data found.  Updated Vital Signs BP 115/79 (BP Location: Left Arm)   Pulse 74   Temp 98.6 F (37 C) (Oral)   Resp 19   Ht 5\' 6"  (1.676 m)   Wt 135 lb (61.2 kg)   LMP 07/15/2022   SpO2 97%    BMI 21.79 kg/m   Visual Acuity Right Eye Distance:   Left Eye Distance:   Bilateral Distance:    Right Eye Near:   Left Eye Near:    Bilateral Near:     Physical Exam Constitutional:      General: She is not in acute distress.    Appearance: Normal appearance. She is not toxic-appearing or diaphoretic.  HENT:     Head: Normocephalic and atraumatic.  Eyes:     Extraocular Movements: Extraocular movements intact.     Conjunctiva/sclera: Conjunctivae normal.  Cardiovascular:     Rate and Rhythm: Normal rate and regular rhythm.     Pulses: Normal pulses.     Heart sounds: Normal heart sounds.  Pulmonary:     Effort: Pulmonary effort is normal. No respiratory distress.     Breath sounds: Normal breath sounds.  Abdominal:     General: Bowel sounds are normal. There is no distension.     Palpations: Abdomen is soft.     Tenderness: There is no abdominal tenderness.     Comments: Patient is significantly tender to palpation to lower abdomen.  Neurological:     General: No focal deficit present.     Mental Status: She is alert and oriented to person, place, and time. Mental status is at baseline.  Psychiatric:        Mood and Affect: Mood normal.        Behavior: Behavior normal.        Thought Content: Thought content normal.        Judgment: Judgment normal.      UC Treatments / Results  Labs (all labs ordered are listed, but only abnormal results are displayed) Labs Reviewed  POCT URINE PREGNANCY    EKG   Radiology No results  found.  Procedures Procedures (including critical care time)  Medications Ordered in UC Medications - No data to display  Initial Impression / Assessment and Plan / UC Course  I have reviewed the triage vital signs and the nursing notes.  Pertinent labs & imaging results that were available during my care of the patient were reviewed by me and considered in my medical decision making (see chart for details).     Given associated  vaginal discharge, could be vaginitis related.  Although, vaginal discharge started today and the patient is significantly tender to palpation on abdominal exam so concerned that imaging may be necessary which cannot be provided here at urgent care.  Advised patient to go to the ER for further evaluation and management and she was agreeable with plan.  Vital signs and patient stable at discharge.  Agree with patient self transport to the ER. Final Clinical Impressions(s) / UC Diagnoses   Final diagnoses:  Lower abdominal pain  Acute low back pain, unspecified back pain laterality, unspecified whether sciatica present  Urine pregnancy test negative     Discharge Instructions      Your pregnancy test was negative.  Please go straight to the ER for further evaluation and management.    ED Prescriptions   None    PDMP not reviewed this encounter.   Gustavus Bryant, [place] 07/28/22 [phone]

## 2022-07-28 NOTE — ED Triage Notes (Addendum)
Pt complains of lower abd pain x 5 days. Denies n/v/d. Has had normal BM this week. Just came off cycle. Pt feels pain with urinating. Was seen at Urgent care today and advised to come to ER

## 2022-07-28 NOTE — ED Provider Notes (Signed)
[place] EMERGENCY DEPARTMENT AT [facility] Provider Note   CSN: [id number] Arrival date & time: 07/28/22  1526     History  Chief Complaint  Patient presents with   Abdominal Pain    Julie Cline is a 27 y.o. female.   Abdominal Pain Associated symptoms: dysuria      27 year old female with medical history significant for gonorrhea and chlamydia, prior UTIs who presents to the emergency department with bilateral lower abdominal pain and abnormal vaginal discharge for the last several days.  The patient states that she is moving her bowels and had a normal bowel movement this week.  She just came off her menstrual period.  She states that she recently had unprotected sexual intercourse and is worried about sexually transmitted infections.  She consents to empiric treatment as well as testing.  She endorses bilateral crampy pelvic discomfort that radiates to her back.  She denies any active vaginal bleeding.  No nausea or vomiting.  No diarrhea.  She is passing gas.  She endorses mild discomfort after urinating.  Home Medications Prior to Admission medications   Medication Sig Start Date End Date Taking? Authorizing Provider  HYDROcodone-acetaminophen (NORCO/VICODIN) 5-325 MG tablet Take 2 tablets by mouth every 4 (four) hours as needed. 07/28/22  Yes Ernie Avena, MD  metroNIDAZOLE (FLAGYL) 500 MG tablet Take 1 tablet (500 mg total) by mouth 2 (two) times daily for 14 days. 07/28/22 08/11/22 Yes Ernie Avena, MD  ondansetron (ZOFRAN-ODT) 4 MG disintegrating tablet Take 1 tablet (4 mg total) by mouth every 8 (eight) hours as needed for nausea or vomiting. 07/28/22  Yes Ernie Avena, MD  doxycycline (VIBRA-TABS) 100 MG tablet Take 1 tablet (100 mg total) by mouth 2 (two) times daily for 14 days. 07/28/22 08/11/22  Ernie Avena, MD  lactobacillus acidophilus & bulgar (LACTINEX) chewable tablet Chew 1 tablet by mouth 3 (three) times daily with meals. 03/11/22   Lamptey,  Britta Mccreedy, MD      Allergies    Patient has no known allergies.    Review of Systems   Review of Systems  Gastrointestinal:  Positive for abdominal pain.  Genitourinary:  Positive for dysuria.  All other systems reviewed and are negative.   Physical Exam Updated Vital Signs BP 115/83   Pulse 78   Temp 98.8 F (37.1 C) (Oral)   Resp 18   Wt 61.2 kg   LMP 07/15/2022   SpO2 100%   BMI 21.79 kg/m  Physical Exam Vitals and nursing note reviewed. Exam conducted with a chaperone present.  Constitutional:      General: She is not in acute distress.    Appearance: She is well-developed.  HENT:     Head: Normocephalic and atraumatic.  Eyes:     Conjunctiva/sclera: Conjunctivae normal.  Cardiovascular:     Rate and Rhythm: Normal rate and regular rhythm.     Heart sounds: No murmur heard. Pulmonary:     Effort: Pulmonary effort is normal. No respiratory distress.     Breath sounds: Normal breath sounds.  Abdominal:     Palpations: Abdomen is soft.     Tenderness: There is abdominal tenderness in the right lower quadrant, suprapubic area and left lower quadrant. There is no guarding or rebound.  Genitourinary:    Cervix: Cervical motion tenderness and discharge present. No cervical bleeding.     Adnexa:        Right: Tenderness present.        Left: Tenderness  present.      Comments: Bilateral adnexal tenderness and positive for motion tenderness, significant mount of cervical discharge that was foul-smelling present Musculoskeletal:        General: No swelling.     Cervical back: Neck supple.  Skin:    General: Skin is warm and dry.     Capillary Refill: Capillary refill takes less than 2 seconds.  Neurological:     Mental Status: She is alert.  Psychiatric:        Mood and Affect: Mood normal.     ED Results / Procedures / Treatments   Labs (all labs ordered are listed, but only abnormal results are displayed) Labs Reviewed  WET PREP, GENITAL - Abnormal; Notable  for the following components:      Result Value   Clue Cells Wet Prep HPF POC PRESENT (*)    WBC, Wet Prep HPF POC >=10 (*)    All other components within normal limits  COMPREHENSIVE METABOLIC PANEL - Abnormal; Notable for the following components:   Potassium 3.4 (*)    Glucose, Bld 120 (*)    Calcium 8.8 (*)    All other components within normal limits  URINALYSIS, ROUTINE W REFLEX MICROSCOPIC - Abnormal; Notable for the following components:   APPearance HAZY (*)    Hgb urine dipstick SMALL (*)    Protein, ur 30 (*)    Leukocytes,Ua LARGE (*)    Bacteria, UA RARE (*)    All other components within normal limits  LIPASE, BLOOD  CBC  HCG, SERUM, QUALITATIVE  HIV ANTIBODY (ROUTINE TESTING W REFLEX)  RPR  GC/CHLAMYDIA PROBE AMP ([place]) NOT AT [facility]    EKG None  Radiology CT ABDOMEN PELVIS W CONTRAST  Result Date: 07/28/2022 CLINICAL DATA:  Right lower quadrant pain for 5 days EXAM: CT ABDOMEN AND PELVIS WITH CONTRAST TECHNIQUE: Multidetector CT imaging of the abdomen and pelvis was performed using the standard protocol following bolus administration of intravenous contrast. RADIATION DOSE REDUCTION: This exam was performed according to the departmental dose-optimization program which includes automated exposure control, adjustment of the mA and/or kV according to patient size and/or use of iterative reconstruction technique. CONTRAST:  75mL OMNIPAQUE IOHEXOL 350 MG/ML SOLN COMPARISON:  CT renal stone protocol December 13, 2018 FINDINGS: Lower chest: Minimal bandlike opacities in the right lower lobe, likely atelectasis/scarring. Normal heart size and no pericardial effusion. Hepatobiliary: No focal liver abnormality is seen. No gallstones, gallbladder wall thickening, or biliary dilatation. Pancreas: Unremarkable. No pancreatic ductal dilatation or surrounding inflammatory changes. Spleen: Normal in size without focal abnormality. Adrenals/Urinary Tract: Adrenal glands are  unremarkable. Symmetric enhancement of bilateral kidneys and no hydronephrosis. No nephrolithiasis. There is a sub-centimeter hypodense lesion in the interpolar left kidney (series 3, image 32) that is otherwise too small to characterize but likely represents a benign cyst. No additional follow-up for this is recommended. Bladder is unremarkable. Stomach/Bowel: Stomach is within normal limits. The appendix is not visualized. No evidence of bowel wall thickening, distention, or inflammatory changes. Vascular/Lymphatic: No significant vascular findings are present. No enlarged abdominal or pelvic lymph nodes. Reproductive: Uterus and bilateral adnexa are within normal limits. Other: Small volume free fluid in the pelvis, likely physiologic. No abdominal wall hernia or abnormality. Musculoskeletal: No acute or significant osseous findings. IMPRESSION: No etiology for abdominal pain is identified. Electronically Signed   By: Jacob Moores M.D.   On: 07/28/2022 17:52    Procedures Procedures    Medications Ordered in ED Medications  ondansetron [facility]) injection 4 mg (4 mg Intravenous Given 07/28/22 1839)  doxycycline (VIBRA-TABS) tablet 100 mg (100 mg Oral Given 07/28/22 1618)  cefTRIAXone (ROCEPHIN) 1 g in sodium chloride 0.9 % 100 mL IVPB (0 g Intravenous Stopped 07/28/22 1718)  lactated ringers bolus 1,000 mL (0 mLs Intravenous Stopped 07/28/22 1740)  iohexol (OMNIPAQUE) 350 MG/ML injection 75 mL (75 mLs Intravenous Contrast Given 07/28/22 1732)  fentaNYL (SUBLIMAZE) injection 50 mcg (50 mcg Intravenous Given 07/28/22 1839)    ED Course/ Medical Decision Making/ A&P Clinical Course as of 07/28/22 1902  Sun Jul 28, 2022  1846 Clue Cells Wet Prep HPF POC(!): PRESENT [JL]    Clinical Course User Index [JL] Ernie Avena, MD                             Medical Decision Making Amount and/or Complexity of Data Reviewed Labs: ordered. Decision-making details documented in ED Course. Radiology:  ordered.  Risk Prescription drug management.     27 year old female with medical history significant for gonorrhea and chlamydia, prior UTIs who presents to the emergency department with bilateral lower abdominal pain and abnormal vaginal discharge for the last several days.  The patient states that she is moving her bowels and had a normal bowel movement this week.  She just came off her menstrual period.  She states that she recently had unprotected sexual intercourse and is worried about sexually transmitted infections.  She consents to empiric treatment as well as testing.  She endorses bilateral crampy pelvic discomfort that radiates to her back.  She denies any active vaginal bleeding.  No nausea or vomiting.  No diarrhea.  She is passing gas.  She endorses mild discomfort after urinating.  Arrival, the patient was vitally stable, afebrile, not tachycardic or tachypneic, saturating well on room air.  Sinus rhythm noted on cardiac telemetry.  Physical exam significant for bilateral lower abdominal tenderness to palpation, no rebound or guarding.  GU exam performed and positive for CMT and bilateral adnexal TTP.  Considered PID, UTI/pyelonephritis, appendicitis, ovarian pathology.  IV access was obtained and the patient was administered an IV fluid bolus.  She was covered for PID with IV Rocephin and doxycycline.  Labs: CMP with mild hypokalemia to 3.4, otherwise unremarkable, CBC without a leukocytosis or anemia, urinalysis with large leukocytes, greater than 50 WBCs and rare bacteria.  CT Abdomen Pelvis: IMPRESSION:  No etiology for abdominal pain is identified.   Will treat for PID, Zofran provided for nausea, Norco provided for pain control, the patient was provided a 14-day course of both doxycycline and Flagyl.  Well-appearing, not septic, tolerating oral intake.  Return precautions provided, stable for discharge.  Final Clinical Impression(s) / ED Diagnoses Final diagnoses:  Lower  abdominal pain  PID (acute pelvic inflammatory disease)  Bacterial vaginosis    Rx / DC Orders ED Discharge Orders          Ordered    doxycycline (VIBRA-TABS) 100 MG tablet  2 times daily,   Status:  Discontinued        07/28/22 1601    metroNIDAZOLE (FLAGYL) 500 MG tablet  2 times daily        07/28/22 1848    doxycycline (VIBRA-TABS) 100 MG tablet  2 times daily        07/28/22 1848    HYDROcodone-acetaminophen (NORCO/VICODIN) 5-325 MG tablet  Every 4 hours PRN        07/28/22 1859  ondansetron (ZOFRAN-ODT) 4 MG disintegrating tablet  Every 8 hours PRN        07/28/22 1859              Ernie Avena, MD 07/28/22 1902

## 2022-07-28 NOTE — Discharge Instructions (Signed)
Your pregnancy test was negative.  Please go straight to the ER for further evaluation and management.

## 2022-07-28 NOTE — ED Notes (Signed)
This nurse attempted to DC patient but she stated she was in 10/10 pain in her back. MD notified.

## 2022-07-29 LAB — GC/CHLAMYDIA PROBE AMP (~~LOC~~) NOT AT ARMC
Chlamydia: NEGATIVE
Comment: NEGATIVE
Comment: NORMAL
Neisseria Gonorrhea: POSITIVE — AB

## 2022-07-29 LAB — RPR: RPR Ser Ql: NONREACTIVE

## 2022-08-03 ENCOUNTER — Other Ambulatory Visit: Payer: Self-pay

## 2022-08-03 ENCOUNTER — Emergency Department (HOSPITAL_COMMUNITY)
Admission: EM | Admit: 2022-08-03 | Discharge: 2022-08-04 | Discharge status: Against Medical Advice | Payer: Medicaid Other | Attending: Emergency Medicine | Admitting: Emergency Medicine

## 2022-08-03 DIAGNOSIS — Z5321 Procedure and treatment not carried out due to patient leaving prior to being seen by health care provider: Secondary | ICD-10-CM | POA: Diagnosis not present

## 2022-08-03 DIAGNOSIS — R102 Pelvic and perineal pain: Secondary | ICD-10-CM | POA: Insufficient documentation

## 2022-08-03 LAB — URINALYSIS, W/ REFLEX TO CULTURE (INFECTION SUSPECTED)
Bacteria, UA: NONE SEEN
Bilirubin Urine: NEGATIVE
Glucose, UA: NEGATIVE mg/dL
Hgb urine dipstick: NEGATIVE
Ketones, ur: NEGATIVE mg/dL
Leukocytes,Ua: NEGATIVE
Nitrite: NEGATIVE
Protein, ur: NEGATIVE mg/dL
Specific Gravity, Urine: 1.017 (ref 1.005–1.030)
pH: 7 (ref 5.0–8.0)

## 2022-08-03 LAB — CBC WITH DIFFERENTIAL/PLATELET
Abs Immature Granulocytes: 0.01 10*3/uL (ref 0.00–0.07)
Basophils Absolute: 0 10*3/uL (ref 0.0–0.1)
Basophils Relative: 1 %
Eosinophils Absolute: 0 10*3/uL (ref 0.0–0.5)
Eosinophils Relative: 1 %
HCT: 40.1 % (ref 36.0–46.0)
Hemoglobin: 13.4 g/dL (ref 12.0–15.0)
Immature Granulocytes: 0 %
Lymphocytes Relative: 46 %
Lymphs Abs: 2 10*3/uL (ref 0.7–4.0)
MCH: 32 pg (ref 26.0–34.0)
MCHC: 33.4 g/dL (ref 30.0–36.0)
MCV: 95.7 fL (ref 80.0–100.0)
Monocytes Absolute: 0.4 10*3/uL (ref 0.1–1.0)
Monocytes Relative: 8 %
Neutro Abs: 1.9 10*3/uL (ref 1.7–7.7)
Neutrophils Relative %: 44 %
Platelets: 389 10*3/uL (ref 150–400)
RBC: 4.19 MIL/uL (ref 3.87–5.11)
RDW: 13.5 % (ref 11.5–15.5)
WBC: 4.4 10*3/uL (ref 4.0–10.5)
nRBC: 0 % (ref 0.0–0.2)

## 2022-08-03 LAB — LIPASE, BLOOD: Lipase: 27 U/L (ref 11–51)

## 2022-08-03 LAB — COMPREHENSIVE METABOLIC PANEL
ALT: 11 U/L (ref 0–44)
AST: 17 U/L (ref 15–41)
Albumin: 3.5 g/dL (ref 3.5–5.0)
Alkaline Phosphatase: 47 U/L (ref 38–126)
Anion gap: 8 (ref 5–15)
BUN: 10 mg/dL (ref 6–20)
CO2: 25 mmol/L (ref 22–32)
Calcium: 8.9 mg/dL (ref 8.9–10.3)
Chloride: 102 mmol/L (ref 98–111)
Creatinine, Ser: 0.77 mg/dL (ref 0.44–1.00)
GFR, Estimated: >60 mL/min (ref >60)
Glucose, Bld: 92 mg/dL (ref 70–99)
Potassium: 4.1 mmol/L (ref 3.5–5.1)
Sodium: 135 mmol/L (ref 135–145)
Total Bilirubin: 0.1 mg/dL — ABNORMAL LOW (ref 0.3–1.2)
Total Protein: 6.6 g/dL (ref 6.5–8.1)

## 2022-08-03 NOTE — ED Notes (Signed)
Pt calledx2, no answer 

## 2022-08-03 NOTE — ED Provider Triage Note (Signed)
Emergency Medicine Provider Triage Evaluation Note  Julie Cline , a 27 y.o. female  was evaluated in triage.  Pt complains of lower abdominal pain.  This has been present for approximately 2 weeks.  She was started on medications for pelvic inflammatory disease approximately 1 week ago.  States the pain has continuously gotten worse.  No urinary symptoms.  No nausea or vomiting.  No fevers or chills.  Vaginal discharge has gotten worse as well.  She has been taking her antibiotics as prescribed  Review of Systems  Positive: As above Negative: As above  Physical Exam  BP 115/83   Pulse 61   Temp 99.2 F (37.3 C) (Oral)   Resp 18   LMP 07/15/2022   SpO2 99%  Gen:   Awake, no distress   Resp:  Normal effort  MSK:   Moves extremities without difficulty  Other:    Medical Decision Making  Medically screening exam initiated at 8:03 PM.  Appropriate orders placed.  TAKEA CARLOW was informed that the remainder of the evaluation will be completed by another provider, this initial triage assessment does not replace that evaluation, and the importance of remaining in the ED until their evaluation is complete.  Labs ordered   Mora Bellman 08/03/22 2003

## 2022-08-03 NOTE — ED Triage Notes (Signed)
Patient reports pain across lower abdomen/pelvic pain for 2 weeks , currently taking oral antibiotic for PID , no fever or chills .

## 2022-10-03 ENCOUNTER — Emergency Department (HOSPITAL_COMMUNITY): Payer: Medicaid Other

## 2022-10-03 ENCOUNTER — Emergency Department (HOSPITAL_COMMUNITY)
Admission: EM | Admit: 2022-10-03 | Discharge: 2022-10-04 | Disposition: A | Discharge status: Other Acute Inpatient Hospital | Payer: Medicaid Other | Attending: Emergency Medicine | Admitting: Emergency Medicine

## 2022-10-03 DIAGNOSIS — Y906 Blood alcohol level of 120-199 mg/100 ml: Secondary | ICD-10-CM | POA: Insufficient documentation

## 2022-10-03 DIAGNOSIS — S0993XA Unspecified injury of face, initial encounter: Secondary | ICD-10-CM | POA: Diagnosis present

## 2022-10-03 DIAGNOSIS — F1090 Alcohol use, unspecified, uncomplicated: Secondary | ICD-10-CM | POA: Diagnosis not present

## 2022-10-03 DIAGNOSIS — S02601A Fracture of unspecified part of body of right mandible, initial encounter for closed fracture: Secondary | ICD-10-CM | POA: Diagnosis not present

## 2022-10-03 DIAGNOSIS — F10929 Alcohol use, unspecified with intoxication, unspecified: Secondary | ICD-10-CM | POA: Diagnosis not present

## 2022-10-03 DIAGNOSIS — Z23 Encounter for immunization: Secondary | ICD-10-CM | POA: Diagnosis not present

## 2022-10-03 DIAGNOSIS — S0269XB Fracture of mandible of other specified site, initial encounter for open fracture: Secondary | ICD-10-CM | POA: Diagnosis not present

## 2022-10-03 DIAGNOSIS — S02652B Fracture of angle of left mandible, initial encounter for open fracture: Secondary | ICD-10-CM

## 2022-10-03 MED ORDER — ONDANSETRON HCL 4 MG/2ML IJ SOLN
4.0000 mg | Freq: Once | INTRAMUSCULAR | Status: AC
  Administered 2022-10-03: 4 mg via INTRAVENOUS
  Filled 2022-10-03: qty 2

## 2022-10-03 MED ORDER — TETANUS-DIPHTH-ACELL PERTUSSIS 5-2.5-18.5 LF-MCG/0.5 IM SUSY
0.5000 mL | PREFILLED_SYRINGE | Freq: Once | INTRAMUSCULAR | Status: AC
  Administered 2022-10-04: 0.5 mL via INTRAMUSCULAR
  Filled 2022-10-03: qty 0.5

## 2022-10-03 MED ORDER — SODIUM CHLORIDE 0.9 % IV BOLUS
1000.0000 mL | Freq: Once | INTRAVENOUS | Status: AC
  Administered 2022-10-04: 1000 mL via INTRAVENOUS

## 2022-10-03 MED ORDER — MORPHINE SULFATE (PF) 4 MG/ML IV SOLN
4.0000 mg | Freq: Once | INTRAVENOUS | Status: AC
  Administered 2022-10-03: 4 mg via INTRAVENOUS
  Filled 2022-10-03: qty 1
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
## 2022-10-04 ENCOUNTER — Emergency Department (HOSPITAL_COMMUNITY): Payer: Medicaid Other

## 2022-10-04 DIAGNOSIS — Y906 Blood alcohol level of 120-199 mg/100 ml: Secondary | ICD-10-CM | POA: Diagnosis not present

## 2022-10-04 DIAGNOSIS — S02601A Fracture of unspecified part of body of right mandible, initial encounter for closed fracture: Secondary | ICD-10-CM | POA: Diagnosis not present

## 2022-10-04 DIAGNOSIS — S0269XB Fracture of mandible of other specified site, initial encounter for open fracture: Secondary | ICD-10-CM | POA: Diagnosis not present

## 2022-10-04 DIAGNOSIS — S02652B Fracture of angle of left mandible, initial encounter for open fracture: Secondary | ICD-10-CM

## 2022-10-04 DIAGNOSIS — Z23 Encounter for immunization: Secondary | ICD-10-CM | POA: Diagnosis not present

## 2022-10-04 DIAGNOSIS — F10929 Alcohol use, unspecified with intoxication, unspecified: Secondary | ICD-10-CM | POA: Diagnosis not present

## 2022-10-04 DIAGNOSIS — S0993XA Unspecified injury of face, initial encounter: Secondary | ICD-10-CM | POA: Diagnosis present

## 2022-10-04 DIAGNOSIS — F1090 Alcohol use, unspecified, uncomplicated: Secondary | ICD-10-CM | POA: Diagnosis not present

## 2022-10-04 DIAGNOSIS — T1490XA Injury, unspecified, initial encounter: Secondary | ICD-10-CM | POA: Insufficient documentation

## 2022-10-04 LAB — CBC WITH DIFFERENTIAL/PLATELET
Abs Immature Granulocytes: 0.03 10*3/uL (ref 0.00–0.07)
Basophils Absolute: 0 10*3/uL (ref 0.0–0.1)
Basophils Relative: 0 %
Eosinophils Absolute: 0 10*3/uL (ref 0.0–0.5)
Eosinophils Relative: 0 %
HCT: 45.1 % (ref 36.0–46.0)
Hemoglobin: 15.1 g/dL — ABNORMAL HIGH (ref 12.0–15.0)
Immature Granulocytes: 0 %
Lymphocytes Relative: 17 %
Lymphs Abs: 1.3 10*3/uL (ref 0.7–4.0)
MCH: 32.1 pg (ref 26.0–34.0)
MCHC: 33.5 g/dL (ref 30.0–36.0)
MCV: 95.8 fL (ref 80.0–100.0)
Monocytes Absolute: 0.5 10*3/uL (ref 0.1–1.0)
Monocytes Relative: 6 %
Neutro Abs: 6 10*3/uL (ref 1.7–7.7)
Neutrophils Relative %: 77 %
Platelets: 236 10*3/uL (ref 150–400)
RBC: 4.71 MIL/uL (ref 3.87–5.11)
RDW: 14.1 % (ref 11.5–15.5)
WBC: 7.9 10*3/uL (ref 4.0–10.5)
nRBC: 0 % (ref 0.0–0.2)

## 2022-10-04 LAB — HCG, SERUM, QUALITATIVE: Preg, Serum: NEGATIVE

## 2022-10-04 LAB — BASIC METABOLIC PANEL
Anion gap: 14 (ref 5–15)
BUN: 8 mg/dL (ref 6–20)
CO2: 24 mmol/L (ref 22–32)
Calcium: 8.9 mg/dL (ref 8.9–10.3)
Chloride: 104 mmol/L (ref 98–111)
Creatinine, Ser: 0.67 mg/dL (ref 0.44–1.00)
GFR, Estimated: >60 mL/min (ref >60)
Glucose, Bld: 94 mg/dL (ref 70–99)
Potassium: 3.4 mmol/L — ABNORMAL LOW (ref 3.5–5.1)
Sodium: 142 mmol/L (ref 135–145)

## 2022-10-04 LAB — ETHANOL: Alcohol, Ethyl (B): 154 mg/dL — ABNORMAL HIGH (ref <10)

## 2022-10-04 MED ORDER — HYDROMORPHONE HCL 1 MG/ML IJ SOLN
0.5000 mg | Freq: Once | INTRAMUSCULAR | Status: AC
  Administered 2022-10-04: 0.5 mg via INTRAVENOUS
  Filled 2022-10-04: qty 1

## 2022-10-04 MED ORDER — CLINDAMYCIN PHOSPHATE 600 MG/50ML IV SOLN
600.0000 mg | Freq: Once | INTRAVENOUS | Status: AC
  Administered 2022-10-04: 600 mg via INTRAVENOUS
  Filled 2022-10-04: qty 50

## 2022-10-04 NOTE — ED Notes (Signed)
Pt returned from radiology.
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
## 2023-02-19 ENCOUNTER — Encounter (HOSPITAL_COMMUNITY): Payer: Self-pay

## 2023-02-19 ENCOUNTER — Ambulatory Visit (HOSPITAL_COMMUNITY)
Admission: EM | Admit: 2023-02-19 | Discharge: 2023-02-19 | Disposition: A | Discharge status: Home / Self Care | Payer: Medicaid Other | Attending: Family Medicine | Admitting: Family Medicine

## 2023-02-19 DIAGNOSIS — N898 Other specified noninflammatory disorders of vagina: Secondary | ICD-10-CM | POA: Diagnosis present

## 2023-02-19 LAB — POCT URINE PREGNANCY: Preg Test, Ur: NEGATIVE

## 2023-02-19 LAB — POCT URINALYSIS DIP (MANUAL ENTRY)
Bilirubin, UA: NEGATIVE
Blood, UA: NEGATIVE
Glucose, UA: NEGATIVE mg/dL
Nitrite, UA: NEGATIVE
Protein Ur, POC: NEGATIVE mg/dL
Spec Grav, UA: 1.02 (ref 1.010–1.025)
Urobilinogen, UA: 0.2 U/dL
pH, UA: 7 (ref 5.0–8.0)

## 2023-02-19 NOTE — Discharge Instructions (Signed)
You have had labs (urine culture and STD testing) sent today. We will call you with any significant abnormalities or if there is need to begin or change treatment or pursue further follow up.  You may also review your test results online through MyChart. If you do not have a MyChart account, instructions to sign up should be on your discharge paperwork.

## 2023-02-19 NOTE — ED Triage Notes (Signed)
Patient reports dysuria x 2 days. Patient would like STI-testing.

## 2023-02-20 LAB — CERVICOVAGINAL ANCILLARY ONLY
Bacterial Vaginitis (gardnerella): POSITIVE — AB
Candida Glabrata: NEGATIVE
Candida Vaginitis: NEGATIVE
Chlamydia: NEGATIVE
Comment: NEGATIVE
Comment: NEGATIVE
Comment: NEGATIVE
Comment: NEGATIVE
Comment: NEGATIVE
Comment: NORMAL
Neisseria Gonorrhea: NEGATIVE
Trichomonas: POSITIVE — AB

## 2023-02-20 LAB — URINE CULTURE: Culture: NO GROWTH

## 2023-02-20 NOTE — ED Provider Notes (Signed)
[facility]   [id number] 02/19/23 Arrival Time: 1904  ASSESSMENT & PLAN:  1. Vaginal irritation   No empiric tx.  Results for orders placed or performed during the hospital encounter of 02/19/23  POC urinalysis dipstick   Collection Time: 02/19/23  8:02 PM  Result Value Ref Range   Color, UA yellow yellow   Clarity, UA clear clear   Glucose, UA negative negative mg/dL   Bilirubin, UA negative negative   Ketones, POC UA trace (5) (A) negative mg/dL   Spec Grav, UA 2.536 6.440 - 1.025   Blood, UA negative negative   pH, UA 7.0 5.0 - 8.0   Protein Ur, POC negative negative mg/dL   Urobilinogen, UA 0.2 0.2 or 1.0 E.U./dL   Nitrite, UA Negative Negative   Leukocytes, UA Small (1+) (A) Negative  POCT urine pregnancy   Collection Time: 02/19/23  8:02 PM  Result Value Ref Range   Preg Test, Ur Negative Negative   Vaginal cytology pending.    Discharge Instructions      You have had labs (urine culture and STD testing) sent today. We will call you with any significant abnormalities or if there is need to begin or change treatment or pursue further follow up.  You may also review your test results online through MyChart. If you do not have a MyChart account, instructions to sign up should be on your discharge paperwork.     Without s/s of PID.    Will notify of any positive results. Instructed to refrain from sexual activity for at least seven days.  Reviewed expectations re: course of current medical issues. Questions answered. Outlined signs and symptoms indicating need for more acute intervention. Patient verbalized understanding. After Visit Summary given.   SUBJECTIVE:  Julie Cline is a 28 y.o. female who reports mild dysuria x 2 days that has now resolved. Denies vaginal discharge but feels slight vaginal irritation/discomfort. Patient would like STI-testing.    Patient's last menstrual period was 02/03/2023.   OBJECTIVE:  Vitals:    02/19/23 1936 02/19/23 1937  BP:  108/80  Pulse: 85   Resp: 16 16  Temp: 98.4 F (36.9 C)   TempSrc: Oral   SpO2: 99%      General appearance: alert, cooperative, appears stated age and no distress Lungs: unlabored respirations; speaks full sentences without difficulty Back: no CVA tenderness; FROM at waist Abdomen: soft, non-tender GU: deferred Skin: warm and dry Psychological: alert and cooperative; normal mood and affect.  Results for orders placed or performed during the hospital encounter of 02/19/23  POC urinalysis dipstick   Collection Time: 02/19/23  8:02 PM  Result Value Ref Range   Color, UA yellow yellow   Clarity, UA clear clear   Glucose, UA negative negative mg/dL   Bilirubin, UA negative negative   Ketones, POC UA trace (5) (A) negative mg/dL   Spec Grav, UA 3.474 2.595 - 1.025   Blood, UA negative negative   pH, UA 7.0 5.0 - 8.0   Protein Ur, POC negative negative mg/dL   Urobilinogen, UA 0.2 0.2 or 1.0 E.U./dL   Nitrite, UA Negative Negative   Leukocytes, UA Small (1+) (A) Negative  POCT urine pregnancy   Collection Time: 02/19/23  8:02 PM  Result Value Ref Range   Preg Test, Ur Negative Negative    Labs Reviewed  POCT URINALYSIS DIP (MANUAL ENTRY) - Abnormal; Notable for the following components:      Result Value   Ketones,  POC UA trace (5) (*)    Leukocytes, UA Small (1+) (*)    All other components within normal limits  URINE CULTURE  POCT URINE PREGNANCY  CERVICOVAGINAL ANCILLARY ONLY    No Known Allergies  Past Medical History:  Diagnosis Date   Chlamydia    Gonorrhea 07/05/2016   To go to HD for tx   Urinary tract infection    Family History  Problem Relation Age of Onset   Diabetes Maternal Uncle    Prostate cancer Paternal Grandfather    Alcohol abuse Neg Hx    Arthritis Neg Hx    Asthma Neg Hx    Birth defects Neg Hx    Cancer Neg Hx    COPD Neg Hx    Depression Neg Hx    Drug abuse Neg Hx    Early death Neg Hx     Hearing loss Neg Hx    Heart disease Neg Hx    Hyperlipidemia Neg Hx    Hypertension Neg Hx    Kidney disease Neg Hx    Learning disabilities Neg Hx    Mental illness Neg Hx    Mental retardation Neg Hx    Miscarriages / Stillbirths Neg Hx    Stroke Neg Hx    Vision loss Neg Hx    Varicose Veins Neg Hx    Social History   Socioeconomic History   Marital status: Single    Spouse name: Not on file   Number of children: Not on file   Years of education: Not on file   Highest education level: Not on file  Occupational History   Not on file  Tobacco Use   Smoking status: Former    Current packs/day: 0.00    Average packs/day: 0.3 packs/day for 6.0 years (1.5 ttl pk-yrs)    Types: Cigarettes    Start date: 01/28/2007    Quit date: 01/27/2013    Years since quitting: 10.0   Smokeless tobacco: Never  Vaping Use   Vaping status: Never Used  Substance and Sexual Activity   Alcohol use: Yes   Drug use: Yes    Frequency: 7.0 times per week    Types: Marijuana    Comment: daily   Sexual activity: Yes    Birth control/protection: None  Other Topics Concern   Not on file  Social History Narrative   Not on file   Social Drivers of Health   Financial Resource Strain: Not on file  Food Insecurity: Low Risk  (10/04/2022)   Received from [facility]   Hunger Vital Sign    Worried About Running Out of Food in the Last Year: Never true    Ran Out of Food in the Last Year: Never true  Transportation Needs: No Transportation Needs (10/04/2022)   Received from [facility]    In the past 12 months, has lack of reliable transportation kept you from medical appointments, meetings, work or from getting things needed for daily living? : No  Physical Activity: Not on file  Stress: Not on file  Social Connections: Unknown (12/30/2021)   Received from [facility]   Social Network    Social Network: Not on file  Intimate Partner Violence: Unknown  (12/30/2021)   Received from [facility]   HITS    Physically Hurt: Not on file    Insult or Talk Down To: Not on file    Threaten Physical Harm: Not on file  Scream or Curse: Not on file           Mardella Layman, MD 02/20/23 [phone]

## 2023-02-21 ENCOUNTER — Telehealth (HOSPITAL_BASED_OUTPATIENT_CLINIC_OR_DEPARTMENT_OTHER): Payer: Self-pay

## 2023-02-21 MED ORDER — METRONIDAZOLE 500 MG PO TABS: 500.0000 mg | ORAL_TABLET | Freq: Two times a day (BID) | ORAL | 0 refills | Status: DC

## 2023-02-21 NOTE — Telephone Encounter (Signed)
Per protocol, pt requires tx with metronidazole. Attempted to reach patient x1. LVM.  Rx sent to pharmacy on file.

## 2023-02-26 ENCOUNTER — Encounter (HOSPITAL_COMMUNITY): Payer: Self-pay | Admitting: *Deleted

## 2023-02-26 ENCOUNTER — Other Ambulatory Visit: Payer: Self-pay

## 2023-02-26 ENCOUNTER — Emergency Department (HOSPITAL_COMMUNITY)
Admission: EM | Admit: 2023-02-26 | Discharge: 2023-02-27 | Discharge status: Against Medical Advice | Payer: Medicaid Other | Attending: Emergency Medicine | Admitting: Emergency Medicine

## 2023-02-26 DIAGNOSIS — J101 Influenza due to other identified influenza virus with other respiratory manifestations: Secondary | ICD-10-CM | POA: Diagnosis not present

## 2023-02-26 DIAGNOSIS — Z5321 Procedure and treatment not carried out due to patient leaving prior to being seen by health care provider: Secondary | ICD-10-CM | POA: Diagnosis not present

## 2023-02-26 DIAGNOSIS — Z20822 Contact with and (suspected) exposure to covid-19: Secondary | ICD-10-CM | POA: Diagnosis not present

## 2023-02-26 DIAGNOSIS — R519 Headache, unspecified: Secondary | ICD-10-CM | POA: Diagnosis present

## 2023-02-26 LAB — CBC WITH DIFFERENTIAL/PLATELET
Abs Immature Granulocytes: 0.02 10*3/uL (ref 0.00–0.07)
Basophils Absolute: 0 10*3/uL (ref 0.0–0.1)
Basophils Relative: 0 %
Eosinophils Absolute: 0 10*3/uL (ref 0.0–0.5)
Eosinophils Relative: 1 %
HCT: 39.9 % (ref 36.0–46.0)
Hemoglobin: 13.4 g/dL (ref 12.0–15.0)
Immature Granulocytes: 0 %
Lymphocytes Relative: 14 %
Lymphs Abs: 0.7 10*3/uL (ref 0.7–4.0)
MCH: 31.5 pg (ref 26.0–34.0)
MCHC: 33.6 g/dL (ref 30.0–36.0)
MCV: 93.7 fL (ref 80.0–100.0)
Monocytes Absolute: 0.5 10*3/uL (ref 0.1–1.0)
Monocytes Relative: 9 %
Neutro Abs: 4.1 10*3/uL (ref 1.7–7.7)
Neutrophils Relative %: 76 %
Platelets: 271 10*3/uL (ref 150–400)
RBC: 4.26 MIL/uL (ref 3.87–5.11)
RDW: 14.2 % (ref 11.5–15.5)
WBC: 5.4 10*3/uL (ref 4.0–10.5)
nRBC: 0 % (ref 0.0–0.2)

## 2023-02-26 LAB — COMPREHENSIVE METABOLIC PANEL
ALT: 18 U/L (ref 0–44)
AST: 21 U/L (ref 15–41)
Albumin: 3.8 g/dL (ref 3.5–5.0)
Alkaline Phosphatase: 55 U/L (ref 38–126)
Anion gap: 11 (ref 5–15)
BUN: 7 mg/dL (ref 6–20)
CO2: 23 mmol/L (ref 22–32)
Calcium: 9 mg/dL (ref 8.9–10.3)
Chloride: 102 mmol/L (ref 98–111)
Creatinine, Ser: 0.72 mg/dL (ref 0.44–1.00)
GFR, Estimated: >60 mL/min (ref >60)
Glucose, Bld: 100 mg/dL — ABNORMAL HIGH (ref 70–99)
Potassium: 3.9 mmol/L (ref 3.5–5.1)
Sodium: 136 mmol/L (ref 135–145)
Total Bilirubin: 0.9 mg/dL (ref 0.0–1.2)
Total Protein: 6.9 g/dL (ref 6.5–8.1)

## 2023-02-26 LAB — RESP PANEL BY RT-PCR (RSV, FLU A&B, COVID)  RVPGX2
Influenza A by PCR: POSITIVE — AB
Influenza B by PCR: NEGATIVE
Resp Syncytial Virus by PCR: NEGATIVE
SARS Coronavirus 2 by RT PCR: NEGATIVE

## 2023-02-26 NOTE — ED Triage Notes (Signed)
Headache and a fever since last night  lmp 2 weeks ago

## 2023-02-26 NOTE — ED Provider Triage Note (Signed)
Emergency Medicine Provider Triage Evaluation Note  Julie Cline , a 28 y.o. female  was evaluated in triage.  Pt complains of viral symptoms.  Review of Systems  Positive:  Negative:   Physical Exam  BP 125/78 (BP Location: Right Arm)   Pulse 74   Temp 98.8 F (37.1 C)   Resp 16   Ht 5\' 6"  (1.676 m)   Wt 61.2 kg   LMP 02/03/2023   SpO2 98%   BMI 21.78 kg/m  Gen:   Awake, no distress   Resp:  Normal effort  MSK:   Moves extremities without difficulty  Other:    Medical Decision Making  Medically screening exam initiated at 7:21 PM.  Appropriate orders placed.  Julie Cline was informed that the remainder of the evaluation will be completed by another provider, this initial triage assessment does not replace that evaluation, and the importance of remaining in the ED until their evaluation is complete.  Headache and subjective fever starting last. Also with rhinorrhea, congestion, sore throat, cough, nausea, vomiting. Diarrhea started 2 days ago. Last episode of diarrhea at 2AM. Last episode of vomiting at 10PM last night. Denies hematemesis, hematochezia. Has been taking over the counter medications without relief.    Julie Cline, [place] 02/26/23 [phone]

## 2023-02-27 NOTE — ED Notes (Signed)
Called patient no answer.

## 2023-02-27 NOTE — ED Notes (Signed)
Called for repeat vitals x3 no answer

## 2023-05-10 ENCOUNTER — Emergency Department (HOSPITAL_COMMUNITY)
Admission: EM | Admit: 2023-05-10 | Discharge: 2023-05-10 | Disposition: A | Discharge status: Home / Self Care | Attending: Emergency Medicine | Admitting: Emergency Medicine

## 2023-05-10 ENCOUNTER — Other Ambulatory Visit: Payer: Self-pay

## 2023-05-10 ENCOUNTER — Encounter (HOSPITAL_COMMUNITY): Payer: Self-pay

## 2023-05-10 DIAGNOSIS — N939 Abnormal uterine and vaginal bleeding, unspecified: Secondary | ICD-10-CM | POA: Insufficient documentation

## 2023-05-10 LAB — COMPREHENSIVE METABOLIC PANEL WITH GFR
ALT: 18 U/L (ref 0–44)
AST: 23 U/L (ref 15–41)
Albumin: 4 g/dL (ref 3.5–5.0)
Alkaline Phosphatase: 36 U/L — ABNORMAL LOW (ref 38–126)
Anion gap: 14 (ref 5–15)
BUN: 6 mg/dL (ref 6–20)
CO2: 25 mmol/L (ref 22–32)
Calcium: 9.1 mg/dL (ref 8.9–10.3)
Chloride: 102 mmol/L (ref 98–111)
Creatinine, Ser: 0.6 mg/dL (ref 0.44–1.00)
GFR, Estimated: >60 mL/min (ref >60)
Glucose, Bld: 89 mg/dL (ref 70–99)
Potassium: 4 mmol/L (ref 3.5–5.1)
Sodium: 141 mmol/L (ref 135–145)
Total Bilirubin: 0.9 mg/dL (ref 0.0–1.2)
Total Protein: 7 g/dL (ref 6.5–8.1)

## 2023-05-10 LAB — CBC WITH DIFFERENTIAL/PLATELET
Abs Immature Granulocytes: 0.01 10*3/uL (ref 0.00–0.07)
Basophils Absolute: 0 10*3/uL (ref 0.0–0.1)
Basophils Relative: 0 %
Eosinophils Absolute: 0 10*3/uL (ref 0.0–0.5)
Eosinophils Relative: 1 %
HCT: 39.9 % (ref 36.0–46.0)
Hemoglobin: 13.3 g/dL (ref 12.0–15.0)
Immature Granulocytes: 0 %
Lymphocytes Relative: 52 %
Lymphs Abs: 2.2 10*3/uL (ref 0.7–4.0)
MCH: 31.7 pg (ref 26.0–34.0)
MCHC: 33.3 g/dL (ref 30.0–36.0)
MCV: 95 fL (ref 80.0–100.0)
Monocytes Absolute: 0.3 10*3/uL (ref 0.1–1.0)
Monocytes Relative: 8 %
Neutro Abs: 1.6 10*3/uL — ABNORMAL LOW (ref 1.7–7.7)
Neutrophils Relative %: 39 %
Platelets: 341 10*3/uL (ref 150–400)
RBC: 4.2 MIL/uL (ref 3.87–5.11)
RDW: 13.2 % (ref 11.5–15.5)
WBC: 4.1 10*3/uL (ref 4.0–10.5)
nRBC: 0 % (ref 0.0–0.2)

## 2023-05-10 LAB — POC URINE PREG, ED: Preg Test, Ur: NEGATIVE

## 2023-05-10 LAB — HCG, QUANTITATIVE, PREGNANCY: hCG, Beta Chain, Quant, S: <1 m[IU]/mL (ref <5)

## 2023-05-10 MED ORDER — IBUPROFEN 400 MG PO TABS
600.0000 mg | ORAL_TABLET | Freq: Once | ORAL | Status: AC
  Administered 2023-05-10: 600 mg via ORAL
  Filled 2023-05-10: qty 1

## 2023-05-10 NOTE — ED Triage Notes (Signed)
 Pt POV d/t vaginal pain with more bleeding with clots than normal for a normal Menstrual cycle.  Pt states it could be time for her cycle but abnormal.

## 2023-05-10 NOTE — Discharge Instructions (Addendum)
 You were seen here for vaginal bleeding.  Labs were reassuring.  Please take Tylenol and ibuprofen as needed for pain.  Please return to the ED for any emergency medical symptoms.

## 2023-05-10 NOTE — ED Provider Notes (Signed)
 [place] EMERGENCY DEPARTMENT AT [facility] Provider Note   CSN: [id number] Arrival date & time: 05/10/23  1533     History  Chief Complaint  Patient presents with   Vaginal Bleeding    Julie Cline is a 28 y.o. female with no significant past medical history presents due to vaginal bleeding.  Patient states that she began having vaginal bleeding this morning with the passage of small clots, a few hour less than coin size.  She has not had more clots since.  She has not changed her pad since she noted bleeding this morning, so it has been several hours.  She endorses crampy lower abdominal pain and lower back pain.  Last period was exactly 1 month ago.  She states her menstrual cycle is usually irregular and not usually this painful.  She initially took Tylenol  with no relief.  Denies any fevers or dysuria.  Denies any concerns for STIs or pregnancy.   Vaginal Bleeding      Home Medications Prior to Admission medications   Medication Sig Start Date End Date Taking? Authorizing Provider  metroNIDAZOLE  (FLAGYL ) 500 MG tablet Take 1 tablet (500 mg total) by mouth 2 (two) times daily. 02/21/23   Banister, Pamela K, MD      Allergies    Patient has no known allergies.    Review of Systems   Review of Systems  Genitourinary:  Positive for vaginal bleeding.    Physical Exam Updated Vital Signs BP 102/68 (BP Location: Right Arm)   Pulse 74   Temp 98.1 F (36.7 C) (Oral)   Resp 16   Ht 5\' 6"  (1.676 m)   Wt 63.5 kg   LMP 04/09/2023 (Approximate)   SpO2 100%   BMI 22.60 kg/m  Physical Exam Vitals and nursing note reviewed.  Constitutional:      General: She is not in acute distress.    Appearance: She is well-developed.  HENT:     Head: Normocephalic and atraumatic.  Eyes:     Conjunctiva/sclera: Conjunctivae normal.  Cardiovascular:     Rate and Rhythm: Normal rate and regular rhythm.     Heart sounds: No murmur heard. Pulmonary:     Effort:  Pulmonary effort is normal. No respiratory distress.     Breath sounds: Normal breath sounds.  Abdominal:     General: Abdomen is flat. There is no distension.     Palpations: Abdomen is soft.     Tenderness: There is no guarding or rebound.     Comments: Suprapubic pain on palpation with no rebound tenderness or guarding, no lateralizing pain.  Musculoskeletal:        General: No swelling.     Cervical back: Neck supple.  Skin:    General: Skin is warm and dry.     Capillary Refill: Capillary refill takes less than 2 seconds.  Neurological:     Mental Status: She is alert.  Psychiatric:        Mood and Affect: Mood normal.     ED Results / Procedures / Treatments   Labs (all labs ordered are listed, but only abnormal results are displayed) Labs Reviewed  CBC WITH DIFFERENTIAL/PLATELET - Abnormal; Notable for the following components:      Result Value   Neutro Abs 1.6 (*)    All other components within normal limits  COMPREHENSIVE METABOLIC PANEL WITH GFR - Abnormal; Notable for the following components:   Alkaline Phosphatase 36 (*)    All  other components within normal limits  HCG, QUANTITATIVE, PREGNANCY  POC URINE PREG, ED    EKG None  Radiology No results found.  Procedures Procedures    Medications Ordered in ED Medications  ibuprofen  (ADVIL ) tablet 600 mg (600 mg Oral Given 05/10/23 1754)    ED Course/ Medical Decision Making/ A&P                                 Medical Decision Making Amount and/or Complexity of Data Reviewed Labs: ordered.   Patient is alert, afebrile, and hemodynamically stable in the ED in no acute distress.  Physical exam as noted above, with mild suprapubic pain to palpation with no rebound tenderness or guarding.  I considered cystitis, the patient does not have any dysuria or fevers.  No lateralizing pain to suggest appendicitis, ovarian torsion, ruptured ovarian cyst. Though not concern for pregnancy test, will obtain testing  to ensure this is not miscarriage versus ectopic pregnancy.  Differential also includes menstruation related pain.  Patient was given ibuprofen .  Labs resulted with hemoglobin 13.3, unremarkable CMP, and negative pregnancy test.  On reexamination, patient does endorse some improvement in her pain with ibuprofen  though no complete resolution.  She has not required any pad changes for her menstruation, which is reassuring that she is not having profuse bleeding, and I believe her hemoglobin is low-grade.  We discussed managing her pain at home with Tylenol  and Motrin  and following up outpatient as needed.  Strict return precautions were given, and patient was discharged in stable condition.        Final Clinical Impression(s) / ED Diagnoses Final diagnoses:  Vaginal bleeding    Rx / DC Orders ED Discharge Orders     None         Lorain Robson, MD 05/11/23 Arliss Lam, MD 05/17/23 1345

## 2023-05-24 ENCOUNTER — Ambulatory Visit (HOSPITAL_COMMUNITY): Admission: EM | Admit: 2023-05-24 | Discharge: 2023-05-24 | Discharge status: Against Medical Advice

## 2023-05-24 NOTE — Progress Notes (Signed)
   05/24/23 0816  [facility] Triage Screening (Walk-ins at [facility] only)  How Did You Hear About Us ? Family/Friend  What Is the Reason for Your Visit/Call Today? Sheehy is a 28 year old female presenting to [facility] accompanied by a man. Pt reports she was DC from [facility] 2 weeks ago. 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 Pt denies alcohol use, Si and AVH. Pt endorses HI thoughts towards the people she stays with. Pt is looking for medication and therapy at this time of assessment.  How Long Has This Been Causing You Problems? <Week  Have You Recently Had Any Thoughts About Hurting Yourself? No  Are You Planning to Commit Suicide/Harm Yourself At This time? No  Have you Recently Had Thoughts About Hurting Someone Marigene Shoulder? Yes  How long ago did you have thoughts of harming others? today  Are You Planning To Harm Someone At This Time? No  Physical Abuse Denies  Verbal Abuse Denies  Sexual Abuse Denies  Exploitation of patient/patient's resources Denies  Self-Neglect Denies  Possible abuse reported to: Other (Comment)  Are you currently experiencing any auditory, visual or other hallucinations? No  Have You Used Any Alcohol or Drugs in the Past 24 Hours? Yes  What Did You Use and How Much? today  Do you have any current medical co-morbidities that require immediate attention? No  Clinician description of patient physical appearance/behavior: agitated, cooperative  What Do You Feel Would Help You the Most Today? Alcohol or Drug Use Treatment  If access to [facility] was not available, would you have sought care in the Emergency Department? No  Determination of Need Urgent (48 hours)  Options For Referral Facility-Based Crisis  Determination of Need filed? Yes

## 2023-09-17 ENCOUNTER — Ambulatory Visit
Admission: EM | Admit: 2023-09-17 | Discharge: 2023-09-17 | Disposition: A | Discharge status: Home / Self Care | Payer: MEDICAID | Attending: Physician Assistant | Admitting: Physician Assistant

## 2023-09-17 DIAGNOSIS — Z87891 Personal history of nicotine dependence: Secondary | ICD-10-CM | POA: Diagnosis not present

## 2023-09-17 DIAGNOSIS — Z202 Contact with and (suspected) exposure to infections with a predominantly sexual mode of transmission: Secondary | ICD-10-CM | POA: Insufficient documentation

## 2023-09-17 MED ORDER — CEFTRIAXONE SODIUM 1 G IJ SOLR
1.0000 g | Freq: Once | INTRAMUSCULAR | Status: AC
  Administered 2023-09-17: 1 g via INTRAMUSCULAR

## 2023-09-17 NOTE — ED Triage Notes (Signed)
 Pt presents to uc with co sti. Pt reports boyfriend got tested and was positive for gonorrhea. She reports no symptoms.

## 2023-09-17 NOTE — ED Provider Notes (Signed)
 [facility]    CSN: [id number] Arrival date & time: 09/17/23  0840      History   Chief Complaint Chief Complaint  Patient presents with   sti check     HPI Julie Cline is a 28 y.o. female.   Patient here today for evaluation of STD exposure.  She reports that her boyfriend tested positive for gonorrhea.  Patient has no symptoms at this time.  She declines blood work today.  The history is provided by the patient.    Past Medical History:  Diagnosis Date   Chlamydia    Gonorrhea 07/05/2016   To go to HD for tx   Urinary tract infection     Patient Active Problem List   Diagnosis Date Noted   Open fracture of left mandibular angle (HCC) 10/04/2022   Closed fracture of right side of mandibular body (HCC) 10/04/2022   Alcoholic intoxication with complication (HCC) 10/04/2022   Trauma 10/04/2022   Positive screening for depression on 9-item Patient Health Questionnaire (PHQ-9) 10/18/2021   Nexplanon  insertion 02/10/2018   History of preterm delivery 01/07/2018   Normal labor 01/05/2018   BV (bacterial vaginosis) 12/18/2017   Supervision of normal pregnancy 04/30/2016   History of placenta abruption 04/30/2016    Past Surgical History:  Procedure Laterality Date   EYE SURGERY     Eye lid   HERNIA REPAIR      OB History     Gravida  3   Para  2   Term  1   Preterm  1   AB  1   Living  2      SAB  1   IAB  0   Ectopic  0   Multiple  0   Live Births  2            Home Medications    Prior to Admission medications   Not on File    Family History Family History  Problem Relation Age of Onset   Diabetes Maternal Uncle    Prostate cancer Paternal Grandfather    Alcohol abuse Neg Hx    Arthritis Neg Hx    Asthma Neg Hx    Birth defects Neg Hx    Cancer Neg Hx    COPD Neg Hx    Depression Neg Hx    Drug abuse Neg Hx    Early death Neg Hx    Hearing loss Neg Hx    Heart disease Neg Hx    Hyperlipidemia Neg  Hx    Hypertension Neg Hx    Kidney disease Neg Hx    Learning disabilities Neg Hx    Mental illness Neg Hx    Mental retardation Neg Hx    Miscarriages / Stillbirths Neg Hx    Stroke Neg Hx    Vision loss Neg Hx    Varicose Veins Neg Hx     Social History Social History   Tobacco Use   Smoking status: Former    Current packs/day: 0.00    Average packs/day: 0.3 packs/day for 6.0 years (1.5 ttl pk-yrs)    Types: Cigarettes    Start date: 01/28/2007    Quit date: 01/27/2013    Years since quitting: 10.6   Smokeless tobacco: Never  Vaping Use   Vaping status: Every Day   Substances: Nicotine, Flavoring  Substance Use Topics   Alcohol use: Yes    Comment: occ   Drug use: Yes  Frequency: 7.0 times per week    Types: Marijuana    Comment: daily     Allergies   Patient has no known allergies.   Review of Systems Review of Systems  Constitutional:  Negative for chills and fever.  Eyes:  Negative for discharge and redness.  Respiratory:  Negative for shortness of breath.   Gastrointestinal:  Negative for abdominal pain, nausea and vomiting.  Genitourinary:  Negative for genital sores and vaginal discharge.  Musculoskeletal:  Negative for back pain.     Physical Exam Triage Vital Signs ED Triage Vitals  Encounter Vitals Group     BP 09/17/23 0852 114/75     Girls Systolic BP Percentile --      Girls Diastolic BP Percentile --      Boys Systolic BP Percentile --      Boys Diastolic BP Percentile --      Pulse Rate 09/17/23 0852 70     Resp 09/17/23 0852 19     Temp 09/17/23 0852 97.8 F (36.6 C)     Temp src --      SpO2 09/17/23 0852 98 %     Weight --      Height --      Head Circumference --      Peak Flow --      Pain Score 09/17/23 0851 0     Pain Loc --      Pain Education --      Exclude from Growth Chart --    No data found.  Updated Vital Signs BP 114/75   Pulse 70   Temp 97.8 F (36.6 C)   Resp 19   LMP 09/08/2023   SpO2 98%    Visual Acuity Right Eye Distance:   Left Eye Distance:   Bilateral Distance:    Right Eye Near:   Left Eye Near:    Bilateral Near:     Physical Exam Vitals and nursing note reviewed.  Constitutional:      General: She is not in acute distress.    Appearance: Normal appearance. She is not ill-appearing.  HENT:     Head: Normocephalic and atraumatic.  Eyes:     Conjunctiva/sclera: Conjunctivae normal.  Cardiovascular:     Rate and Rhythm: Normal rate.  Pulmonary:     Effort: Pulmonary effort is normal. No respiratory distress.  Neurological:     Mental Status: She is alert.  Psychiatric:        Mood and Affect: Mood normal.        Behavior: Behavior normal.        Thought Content: Thought content normal.      UC Treatments / Results  Labs (all labs ordered are listed, but only abnormal results are displayed) Labs Reviewed  CERVICOVAGINAL ANCILLARY ONLY    EKG   Radiology No results found.  Procedures Procedures (including critical care time)  Medications Ordered in UC Medications  cefTRIAXone  (ROCEPHIN ) injection 1 g (1 g Intramuscular Given 09/17/23 0917)    Initial Impression / Assessment and Plan / UC Course  I have reviewed the triage vital signs and the nursing notes.  Pertinent labs & imaging results that were available during my care of the patient were reviewed by me and considered in my medical decision making (see chart for details).    Rocephin  injection administered in office given known exposure to gonorrhea.  Will order STD screening otherwise will await results further recommendation.  Advised she abstain from  sexual activity for at least a week following Rocephin  injection and further abstinence will depend on results from STD screening today..  Final Clinical Impressions(s) / UC Diagnoses   Final diagnoses:  Exposure to gonorrhea   Discharge Instructions   None    ED Prescriptions   None    PDMP not reviewed this  encounter.   Billy Asberry FALCON, PA-C 09/17/23 [phone]

## 2023-09-18 ENCOUNTER — Ambulatory Visit (HOSPITAL_COMMUNITY): Payer: Self-pay

## 2023-09-18 LAB — CERVICOVAGINAL ANCILLARY ONLY
Bacterial Vaginitis (gardnerella): POSITIVE — AB
Candida Glabrata: NEGATIVE
Candida Vaginitis: NEGATIVE
Chlamydia: NEGATIVE
Comment: NEGATIVE
Comment: NEGATIVE
Comment: NEGATIVE
Comment: NEGATIVE
Comment: NEGATIVE
Comment: NORMAL
Neisseria Gonorrhea: POSITIVE — AB
Trichomonas: NEGATIVE

## 2023-09-18 MED ORDER — METRONIDAZOLE 500 MG PO TABS: 500.0000 mg | ORAL_TABLET | Freq: Two times a day (BID) | ORAL | 0 refills | Status: DC

## 2023-10-13 ENCOUNTER — Other Ambulatory Visit: Payer: Self-pay

## 2023-10-13 ENCOUNTER — Ambulatory Visit
Admission: EM | Admit: 2023-10-13 | Discharge: 2023-10-13 | Disposition: A | Discharge status: Home / Self Care | Payer: MEDICAID | Attending: Nurse Practitioner | Admitting: Nurse Practitioner

## 2023-10-13 ENCOUNTER — Encounter: Payer: Self-pay | Admitting: *Deleted

## 2023-10-13 DIAGNOSIS — N898 Other specified noninflammatory disorders of vagina: Secondary | ICD-10-CM | POA: Insufficient documentation

## 2023-10-13 LAB — POCT URINE PREGNANCY: Preg Test, Ur: NEGATIVE

## 2023-10-13 MED ORDER — METRONIDAZOLE 500 MG PO TABS: 500.0000 mg | ORAL_TABLET | Freq: Two times a day (BID) | ORAL | 0 refills | Status: AC

## 2023-10-13 MED ORDER — CEFTRIAXONE SODIUM 500 MG IJ SOLR
500.0000 mg | Freq: Once | INTRAMUSCULAR | Status: AC
  Administered 2023-10-13: 500 mg via INTRAMUSCULAR

## 2023-10-13 NOTE — ED Triage Notes (Signed)
 Pt reports vaginal discharge x 1 week. States she was treated for gonorrhea in August. States she thinks her bf gave it to her again.

## 2023-10-13 NOTE — ED Provider Notes (Signed)
 [facility]    CSN: [id number] Arrival date & time: 10/13/23  1520      History   Chief Complaint Chief Complaint  Patient presents with   Vaginal Discharge    HPI Julie Cline is a 28 y.o. female.   Discussed the use of AI scribe software for clinical note transcription with the patient, who gave verbal consent to proceed.   The patient presents with complaints of vaginal discharge, vaginal irritation, and low back pain. She denies fever, abdominal pain, nausea, vomiting, dysuria, vaginal itching, or odor. Her last menstrual period was on approximately 10/02/2023. She states that her current symptoms feel similar to those experienced a few weeks ago. On 09/17/2023, she was evaluated for gonorrhea exposure, and testing at that time was positive for both gonorrhea and bacterial vaginosis. She reports ongoing sexual activity with the same partner, with no condom use, and denies sexual intercourse with anyone else.  The following sections of the patient's history were reviewed and updated as appropriate: allergies, current medications, past family history, past medical history, past social history, past surgical history, and problem list.       draft patient discharge instructions based on above that is concise but detailed and without bullets. provide examples of symptom management as well as indications for PCP or specialist follow-up as well as indications for ED evaluation.  Past Medical History:  Diagnosis Date   Chlamydia    Gonorrhea 07/05/2016   To go to HD for tx   Urinary tract infection     Patient Active Problem List   Diagnosis Date Noted   Open fracture of left mandibular angle (HCC) 10/04/2022   Closed fracture of right side of mandibular body (HCC) 10/04/2022   Alcoholic intoxication with complication (HCC) 10/04/2022   Trauma 10/04/2022   Positive screening for depression on 9-item Patient Health Questionnaire (PHQ-9) 10/18/2021   Nexplanon   insertion 02/10/2018   History of preterm delivery 01/07/2018   Normal labor 01/05/2018   BV (bacterial vaginosis) 12/18/2017   Supervision of normal pregnancy 04/30/2016   History of placenta abruption 04/30/2016    Past Surgical History:  Procedure Laterality Date   EYE SURGERY     Eye lid   HERNIA REPAIR      OB History     Gravida  3   Para  2   Term  1   Preterm  1   AB  1   Living  2      SAB  1   IAB  0   Ectopic  0   Multiple  0   Live Births  2            Home Medications    Prior to Admission medications   Medication Sig Start Date End Date Taking? Authorizing Provider  metroNIDAZOLE  (FLAGYL ) 500 MG tablet Take 1 tablet (500 mg total) by mouth 2 (two) times daily. 10/13/23  Yes Iola Lukes, FNP    Family History Family History  Problem Relation Age of Onset   Diabetes Maternal Uncle    Prostate cancer Paternal Grandfather    Alcohol abuse Neg Hx    Arthritis Neg Hx    Asthma Neg Hx    Birth defects Neg Hx    Cancer Neg Hx    COPD Neg Hx    Depression Neg Hx    Drug abuse Neg Hx    Early death Neg Hx    Hearing loss Neg Hx  Heart disease Neg Hx    Hyperlipidemia Neg Hx    Hypertension Neg Hx    Kidney disease Neg Hx    Learning disabilities Neg Hx    Mental illness Neg Hx    Mental retardation Neg Hx    Miscarriages / Stillbirths Neg Hx    Stroke Neg Hx    Vision loss Neg Hx    Varicose Veins Neg Hx     Social History Social History   Tobacco Use   Smoking status: Former    Current packs/day: 0.00    Average packs/day: 0.3 packs/day for 6.0 years (1.5 ttl pk-yrs)    Types: Cigarettes    Start date: 01/28/2007    Quit date: 01/27/2013    Years since quitting: 10.7   Smokeless tobacco: Never  Vaping Use   Vaping status: Every Day   Substances: Nicotine, Flavoring  Substance Use Topics   Alcohol use: Yes    Comment: occ   Drug use: Yes    Frequency: 7.0 times per week    Types: Marijuana    Comment:  daily     Allergies   Patient has no known allergies.   Review of Systems Review of Systems  Constitutional:  Negative for fever.  Gastrointestinal:  Negative for abdominal pain, nausea and vomiting.  Genitourinary:  Positive for vaginal discharge. Negative for dysuria and menstrual problem (LMP around 10/02/23).       Vaginal irritation. No vaginal itching or odor.   Musculoskeletal:  Positive for back pain (lower).  All other systems reviewed and are negative.    Physical Exam Triage Vital Signs ED Triage Vitals [10/13/23 1608]  Encounter Vitals Group     BP      Girls Systolic BP Percentile      Girls Diastolic BP Percentile      Boys Systolic BP Percentile      Boys Diastolic BP Percentile      Pulse      Resp      Temp      Temp src      SpO2      Weight      Height      Head Circumference      Peak Flow      Pain Score 5     Pain Loc      Pain Education      Exclude from Growth Chart    No data found.  Updated Vital Signs LMP 09/08/2023   Breastfeeding No   Visual Acuity Right Eye Distance:   Left Eye Distance:   Bilateral Distance:    Right Eye Near:   Left Eye Near:    Bilateral Near:     Physical Exam Constitutional:      General: She is not in acute distress.    Appearance: Normal appearance. She is not ill-appearing, toxic-appearing or diaphoretic.  HENT:     Head: Normocephalic.     Nose: Nose normal.     Mouth/Throat:     Mouth: Mucous membranes are moist.  Eyes:     Conjunctiva/sclera: Conjunctivae normal.  Cardiovascular:     Rate and Rhythm: Normal rate.  Pulmonary:     Effort: Pulmonary effort is normal.  Abdominal:     Palpations: Abdomen is soft.  Genitourinary:    Comments: Deferred; patient performed self-swab for Aptima testing  Musculoskeletal:        General: Normal range of motion.     Cervical back: Normal  range of motion and neck supple.  Skin:    General: Skin is warm and dry.  Neurological:     General: No  focal deficit present.     Mental Status: She is alert and oriented to person, place, and time.  Psychiatric:        Mood and Affect: Mood normal.        Behavior: Behavior normal.      UC Treatments / Results  Labs (all labs ordered are listed, but only abnormal results are displayed) Labs Reviewed  POCT URINE PREGNANCY - Normal  CERVICOVAGINAL ANCILLARY ONLY    EKG   Radiology No results found.  Procedures Procedures (including critical care time)  Medications Ordered in UC Medications  cefTRIAXone  (ROCEPHIN ) injection 500 mg (500 mg Intramuscular Given 10/13/23 1805)    Initial Impression / Assessment and Plan / UC Course  I have reviewed the triage vital signs and the nursing notes.  Pertinent labs & imaging results that were available during my care of the patient were reviewed by me and considered in my medical decision making (see chart for details).     The patient was evaluated for vaginal discharge, irritation, and low back pain with history of prior gonorrhea and BV. Current presentation is consistent with possible recurrent infection. A urine HCG was negative. Empiric treatment was initiated with a Rocephin  injection in clinic for gonorrhea and a 7-day course of Flagyl  for BV. A vaginal swab was obtained and is pending. The patient was counseled to abstain from sexual activity until symptoms resolve and results return, and to follow up with her primary care provider for further management. Emergency precautions were provided for worsening pain, fever, or new concerning symptoms.  Today's evaluation has revealed no signs of a dangerous process. Discussed diagnosis with patient and/or guardian. Patient and/or guardian aware of their diagnosis, possible red flag symptoms to watch out for and need for close follow up. Patient and/or guardian understands verbal and written discharge instructions. Patient and/or guardian comfortable with plan and disposition.  Patient  and/or guardian has a clear mental status at this time, good insight into illness (after discussion and teaching) and has clear judgment to make decisions regarding their care  Documentation was completed with the aid of voice recognition software. Transcription may contain typographical errors.  Final Clinical Impressions(s) / UC Diagnoses   Final diagnoses:  Vaginal discharge     Discharge Instructions      You were seen today for vaginal discharge, irritation, and low back pain. Based on your history and exam, you were treated in the clinic with an injection of Rocephin  for gonorrhea and prescribed Flagyl  to take twice daily for 7 days for bacterial vaginosis. A pregnancy test today was negative, and a vaginal swab was collected with results still pending. Please complete your full course of Flagyl  even if your symptoms improve, and do not drink alcohol while taking this medication, as it can cause a serious reaction. Avoid sexual activity until your symptoms have resolved and all of your test results are back to reduce the risk of reinfection or spreading infection. Your partner should also be evaluated and treated before resuming sexual activity.  For comfort, you may take Tylenol  as needed for pain, rest, and drink plenty of fluids. Wear loose-fitting cotton underwear and avoid scented soaps, sprays, or douches, as these can worsen irritation. Please follow up with your primary care provider or gynecologist to review your results and ensure your symptoms are improving.  Go to the emergency department right away if you develop severe abdominal or pelvic pain, high fever, heavy vaginal bleeding, persistent vomiting, or worsening symptoms.      ED Prescriptions     Medication Sig Dispense Auth. Provider   metroNIDAZOLE  (FLAGYL ) 500 MG tablet Take 1 tablet (500 mg total) by mouth 2 (two) times daily. 14 tablet Iola Lukes, FNP      PDMP not reviewed this encounter.   Iola Lukes, [place] 10/13/23 1819

## 2023-10-13 NOTE — ED Notes (Signed)
 Called x 2 for triage. Pt had come back into lobby but then went back to her car

## 2023-10-13 NOTE — Discharge Instructions (Addendum)
 You were seen today for vaginal discharge, irritation, and low back pain. Based on your history and exam, you were treated in the clinic with an injection of Rocephin  for gonorrhea and prescribed Flagyl  to take twice daily for 7 days for bacterial vaginosis. A pregnancy test today was negative, and a vaginal swab was collected with results still pending. Please complete your full course of Flagyl  even if your symptoms improve, and do not drink alcohol while taking this medication, as it can cause a serious reaction. Avoid sexual activity until your symptoms have resolved and all of your test results are back to reduce the risk of reinfection or spreading infection. Your partner should also be evaluated and treated before resuming sexual activity.  For comfort, you may take Tylenol  as needed for pain, rest, and drink plenty of fluids. Wear loose-fitting cotton underwear and avoid scented soaps, sprays, or douches, as these can worsen irritation. Please follow up with your primary care provider or gynecologist to review your results and ensure your symptoms are improving. Go to the emergency department right away if you develop severe abdominal or pelvic pain, high fever, heavy vaginal bleeding, persistent vomiting, or worsening symptoms.

## 2023-10-13 NOTE — ED Notes (Signed)
Called x 1 for triage 

## 2023-10-14 LAB — CERVICOVAGINAL ANCILLARY ONLY
Bacterial Vaginitis (gardnerella): POSITIVE — AB
Candida Glabrata: NEGATIVE
Candida Vaginitis: NEGATIVE
Chlamydia: NEGATIVE
Comment: NEGATIVE
Comment: NEGATIVE
Comment: NEGATIVE
Comment: NEGATIVE
Comment: NEGATIVE
Comment: NORMAL
Neisseria Gonorrhea: NEGATIVE
Trichomonas: NEGATIVE

## 2023-10-16 ENCOUNTER — Ambulatory Visit (HOSPITAL_COMMUNITY): Payer: Self-pay
